# Patient Record
Sex: Male | Born: 1965 | Race: White | Hispanic: No | Marital: Married | Smoking: Never smoker
Health system: Southern US, Community
[De-identification: ages and names within clinical notes are randomized; demographics above are authoritative.]

## PROBLEM LIST (undated history)

## (undated) DIAGNOSIS — M51369 Other intervertebral disc degeneration, lumbar region without mention of lumbar back pain or lower extremity pain: Secondary | ICD-10-CM

## (undated) DIAGNOSIS — R768 Other specified abnormal immunological findings in serum: Secondary | ICD-10-CM

## (undated) DIAGNOSIS — K219 Gastro-esophageal reflux disease without esophagitis: Secondary | ICD-10-CM

## (undated) DIAGNOSIS — E785 Hyperlipidemia, unspecified: Secondary | ICD-10-CM

## (undated) DIAGNOSIS — M5136 Other intervertebral disc degeneration, lumbar region: Secondary | ICD-10-CM

## (undated) HISTORY — DX: Other intervertebral disc degeneration, lumbar region without mention of lumbar back pain or lower extremity pain: M51.369

## (undated) HISTORY — PX: NASAL SINUS SURGERY: SHX719

## (undated) HISTORY — DX: Other intervertebral disc degeneration, lumbar region: M51.36

## (undated) HISTORY — DX: Gastro-esophageal reflux disease without esophagitis: K21.9

## (undated) HISTORY — DX: Other specified abnormal immunological findings in serum: R76.8

## (undated) HISTORY — DX: Hyperlipidemia, unspecified: E78.5

---
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
## 2005-02-21 DIAGNOSIS — R768 Other specified abnormal immunological findings in serum: Secondary | ICD-10-CM

## 2005-02-21 HISTORY — DX: Other specified abnormal immunological findings in serum: R76.8

## 2005-08-24 HISTORY — PX: ESOPHAGOGASTRODUODENOSCOPY: SHX1529

## 2008-01-27 ENCOUNTER — Ambulatory Visit: Payer: Self-pay | Admitting: Vascular Surgery

## 2008-04-16 ENCOUNTER — Ambulatory Visit: Payer: Self-pay | Admitting: Cardiology

## 2008-05-10 ENCOUNTER — Ambulatory Visit: Payer: Self-pay | Admitting: Cardiology

## 2008-05-23 ENCOUNTER — Ambulatory Visit: Payer: Self-pay

## 2008-05-23 ENCOUNTER — Encounter: Payer: Self-pay | Admitting: Cardiology

## 2009-10-10 ENCOUNTER — Encounter: Payer: Self-pay | Admitting: Cardiology

## 2009-10-25 DIAGNOSIS — E785 Hyperlipidemia, unspecified: Secondary | ICD-10-CM | POA: Insufficient documentation

## 2009-10-29 ENCOUNTER — Ambulatory Visit: Payer: Self-pay | Admitting: Cardiology

## 2009-10-29 DIAGNOSIS — R072 Precordial pain: Secondary | ICD-10-CM | POA: Insufficient documentation

## 2009-10-29 DIAGNOSIS — R0602 Shortness of breath: Secondary | ICD-10-CM | POA: Insufficient documentation

## 2010-09-23 NOTE — Assessment & Plan Note (Signed)
Summary: f1y   Visit Type:  Follow-up Primary Provider:  Dr. Vernon Prey  CC:  Dyspnea.  History of Present Illness: The patient presents for followup of dyspnea.  I saw him a couple of years ago for evaluation of acute dyspnea. He has a family history of coronary disease. At that time he had an exercise treadmill test which demonstrated no evidence of high-grade obstructive coronary disease. He presents now because he had dyspnea recently while walking showing some property. He works in Research officer, political party. This was about 15 minutes of walking up and down some inclines. However, he became more dyspneic than he expected for that distance. He reports that he has not been exercising for about one year. He has not been having any chest pressure, neck or arm discomfort. He has not been having any palpitations, presyncope or syncope. He denies any PND or orthopnea.  Current Medications (verified): 1)  Multivitamins   Tabs (Multiple Vitamin) .Marland Kitchen.. 1 By Mouth Daily  Allergies (verified): No Known Drug Allergies  Past History:  Past Medical History:  Dyslipidemia   Past Surgical History: Reviewed history from 10/25/2009 and no changes required.  Sinus surgery  Review of Systems       As stated in the HPI and negative for all other systems.   Vital Signs:  Patient profile:   45 year old male Height:      68 inches Weight:      163 pounds BMI:     24.87 Pulse rate:   70 / minute Resp:     16 per minute BP sitting:   124 / 86  (right arm)  Vitals Entered By: Marrion Coy, CNA (October 29, 2009 11:51 AM)  Physical Exam  General:  Well developed, well nourished, in no acute distress. Head:  normocephalic and atraumatic Eyes:  PERRLA/EOM intact; conjunctiva and lids normal. Mouth:  Teeth, gums and palate normal. Oral mucosa normal. Neck:  Neck supple, no JVD. No masses, thyromegaly or abnormal cervical nodes. Chest Wall:  no deformities or breast masses noted Lungs:  Clear bilaterally to  auscultation and percussion. Abdomen:  Bowel sounds positive; abdomen soft and non-tender without masses, organomegaly, or hernias noted. No hepatosplenomegaly. Msk:  Back normal, normal gait. Muscle strength and tone normal. Extremities:  No clubbing or cyanosis. Neurologic:  Alert and oriented x 3. Skin:  Intact without lesions or rashes. Cervical Nodes:  no significant adenopathy Axillary Nodes:  no significant adenopathy Inguinal Nodes:  no significant adenopathy Psych:  Normal affect.   Detailed Cardiovascular Exam  Neck    Carotids: Carotids full and equal bilaterally without bruits.      Neck Veins: Normal, no JVD.    Heart    Inspection: no deformities or lifts noted.      Palpation: normal PMI with no thrills palpable.      Auscultation: regular rate and rhythm, S1, S2 without murmurs, rubs, gallops, or clicks.    Vascular    Abdominal Aorta: no palpable masses, pulsations, or audible bruits.      Femoral Pulses: normal femoral pulses bilaterally.      Pedal Pulses: normal pedal pulses bilaterally.      Radial Pulses: normal radial pulses bilaterally.      Peripheral Circulation: no clubbing, cyanosis, or edema noted with normal capillary refill.     EKG  Procedure date:  10/29/2009  Findings:      sinus rhythm, rate 70, axis rightward, intervals within normal limits, no acute ST-T wave  changes.  Impression & Recommendations:  Problem # 1:  DYSPNEA (ICD-786.05) The patient presents with this complaint. However, his physical examination is normal. He has no significant risk factors other than family history. He had a negative stress test 2 years ago. At this point I think the pretest probability of obstructive coronary disease as an etiology of this complaint is low. I suggested to him an exercise regimen. I told him that if he does not improve with this we should consider stress testing but that if dyspnea remains the predominant complaint I would suggest  cardiopulmonary stress testing rather than a plain old exercise treadmill test. He will begin an exercise regimen and let me know if he worsens or has no marked improvement. Orders: EKG w/ Interpretation (93000)  Problem # 2:  DYSLIPIDEMIA (ICD-272.4) I reviewed his lipids done by his primary physician. His HDL was 43 with an LDL of 46. Particle size was not in the optimum range. We did discuss the possibility of treatment with Niaspan as suggested by his primary physician. We discussed current guideline recommendations. He will go back and have further discussion with his physician concerning any further management.  Patient Instructions: 1)  Your physician recommends that you schedule a follow-up appointment as needed 2)  Your physician recommends that you continue on your current medications as directed. Please refer to the Current Medication list given to you today.

## 2011-01-06 NOTE — Procedures (Signed)
[facility]                              EXERCISE TREADMILL   NAME:[name], [name]                      MRN:          [id number]  DATE:05/10/2008                            DOB:          Oct 08, 1965    PROCEDURE:  Exercise treadmill test.   INDICATIONS:  Evaluate patient with multiple cardiovascular risk factors  and some vague chest discomfort.   PROCEDURE NOTE:  The patient was exercised using accelerated Bruce  protocol.  He was able to exercise into stage IV.  He exercised for 12  minutes and 30 seconds.  He achieved 17.3 METs.  His peak heart rate was  187, which was 105% of predicted.  He had appropriate blood pressure  response with a maximum of 156/76.  The test was terminated because he  achieved his target heart rate and beyond.  He had no chest pain or  other anginal equivalent.  No shortness of breath.  He did have some  ventricular tachycardia nonsustained at peak exercise.  He had multiple  couplets in one 5 beat run.  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  He did not notice this.  He had no ectopy in recovery.  He  had a normal heart rate recovery.   CONCLUSION:  Negative adequate exercise treadmill test with no evidence  of high-grade obstructive coronary artery disease.  He had an excellent  exercise tolerance.  He did have the ectopy as described, though it was  peak exertion not a recovery, which would be more worrisome for  obstruction.   PLAN:  Based on the above, I am going to make sure he has a structurally  normal heart given the ectopy.  If so no further cardiovascular testing  is suggested.  He will get an echocardiogram.     Rollene Rotunda, MD, [facility]  Electronically Signed    JH/MedQ  DD: 05/10/2008  DT: 05/11/2008  Job #: [id number]   cc:   Ernestina Penna, M.D.

## 2011-01-06 NOTE — Assessment & Plan Note (Signed)
[name]                            CARDIOLOGY OFFICE NOTE   NAME:[name], [name]                      MRN:          [id number]  DATE:04/16/2008                            DOB:          1965/10/01    PRIMARY CARE PHYSICIAN:  Ernestina Penna, MD   REASON FOR CONSULTATION:  Evaluate the patient with multiple  cardiovascular risk factors.   HISTORY OF PRESENT ILLNESS:  The patient is a pleasant 45 year old  gentleman with no prior cardiac history.  He has a very strong family  history of early-onset heart disease in his brothers.  His father has  advanced heart disease at a later age.  He has not had any  cardiovascular testing in the past.  He does have some dyslipidemia.  He  tries to watch what he eats.  He has had no recent symptoms.  He tries  to exercise 3 days a week about 2 miles at a time.  With this, he does  not get any chest pressure, neck, or arm discomfort.  He does not have  any palpitation, presyncope, or syncope.  He has had no PND or  orthopnea.  He did have an episode of acute dyspnea a few months ago.  He thought it might have been a panic attack.   PAST MEDICAL HISTORY:  Dyslipidemia (his LDL is 85, but his HDL is only  32).  The patient has no history of diabetes or hypertension.   PAST SURGICAL HISTORY:  Sinus surgery.   ALLERGIES:  None.   MEDICATIONS:  None.   SOCIAL HISTORY:  The patient is a Scientist, research (physical sciences).  He is married.  He has 2 children.  He has never smoked cigarettes.  He does not drink  alcohol.   FAMILY HISTORY:  Is contributory for early coronary artery disease.  He  had a brother with myocardial infarction at age 80.  He has another  brother with bypass at age 22.   REVIEW OF SYSTEMS:  As stated in the HPI and negative for all other  systems.   PHYSICAL EXAMINATION:  GENERAL:  The patient is in no distress.  VITAL SIGNS:  Blood pressure 120/79, heart rate 79 and regular, weight  160 pounds, and  body mass index 25.  HEENT:  Otherwise unremarkable; pupils equal, round, and reactive to  light; fundi nonvisualized;oral mucosa unremarkable.  NECK:  No jugular venous distention at 45 degrees; carotid upstroke  brisk and symmetric; no bruits, and no thyromegaly.  LYMPHATICS:  No cervical, axillary, or inguinal adenopathy.  LUNGS:  Clear to auscultation bilaterally.  BACK:  No costovertebral angle tenderness.  CHEST:  Unremarkable.  HEART:  PMI nondisplaced or sustained; S1 and S2 within normal limits;  no S3, no S4, and no murmurs.  ABDOMEN:  Flat; positive bowel sounds, normal in frequency and pitch; no  bruits, no rebound, no guarding, no midline pulsatile mass; no  hepatomegaly, no splenomegaly.  SKIN:  No rashes, no nodules.  EXTREMITIES:  2+ pulses throughout; no edema, no cyanosis, no clubbing.  NEUROLOGIC:  Oriented to person, place, and  time; cranial nerves II-XII  grossly intact; motor grossly intact.   EKG sinus rhythm, rate 66, axis within normal limits, intervals within  normal limits, no acute ST-T wave changes.   ASSESSMENT AND PLAN:  1. Dyspnea.  The patient had an episode of dyspnea few weeks back that      he thought may have been panic.  He does have multiple      cardiovascular risk factors.  In particularly, he has a very strong      family history.  The pretest probability of obstructive coronary      artery disease as an etiology is somewhat low.  However, exercise      treadmill testing is indicated.  This would allow me to adequately      exclude obstructive coronary artery disease.  In addition, it will      allow me to risk stratify.  Most importantly, it would allow me to      give him a prescription for exercise.  He is not exercising as much      as this would be suggested for primary risk reduction, and he is      interested in preventive medicine.  2. Dyslipidemia.  His HDL is slightly low.  His LDL is at target.  The      exercise regimen above  should increase his HDL by 10%.  He does not      need prescription therapy at this point.  3. Followup will be at the time of this POET (plain old exercise      treadmill).     Rollene Rotunda, MD, [facility]  Electronically Signed    JH/MedQ  DD: 04/16/2008  DT: 04/17/2008  Job #: [id number]   cc:   Ernestina Penna, M.D.

## 2011-01-06 NOTE — Consult Note (Signed)
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  He is an otherwise healthy 45-  year-old gentleman with a progressive pain in his right leg.  He had  noticed some small varicosities but over the past several weeks had had  pain associated with a prominent varix in his medial proximal calf.  This extends down into his mid calf.  He has also noted some new onset  of tingling in the dorsum of his foot.  He does not have any history of  deep venous thrombosis.  He is otherwise healthy with no major medical  difficulties.  He does not smoke cigarettes.  He is on no current  medications.  He has no known drug allergies.   PHYSICAL EXAM:  Well-developed well-nourished white male appearing  stated age of 54.  Blood pressure 130/79, pulse 71, respirations 18.  His dorsalis pedis pulses are 2+ bilaterally.  Does not have any  varicosities in the right leg.  He does have scattered telangiectasia  around his right ankle.  On the left leg, he does have varicose vein in  his medial calf.  He underwent screening handheld duplex by me and this  shows gross reflux and an enlarged right great saphenous vein to his  left thigh.  I discussed the significance of this with the patient.  Explained that he does not have any findings that would put him at any  significant risk regarding his left leg.  I did explain this, in all  likelihood will be progressive over time.  I explained initial treatment  would be graduated compression garments, should he develop worsening  symptoms and if he had failure of this he would be an excellent  candidate for laser ablation for correction of venous hypertension.  He  was reassured with this discussion and will see [place] again on an as-needed  basis.   Larina Earthly,  M.[name].  Electronically Signed   TFE/MEDQ  [name]:  01/27/2008  T:  01/30/2008  Job:  [id number]   cc:   Ernestina Penna, M.[name].

## 2011-02-16 ENCOUNTER — Encounter: Payer: Self-pay | Admitting: Internal Medicine

## 2011-04-01 ENCOUNTER — Encounter: Payer: Self-pay | Admitting: Internal Medicine

## 2011-04-01 ENCOUNTER — Ambulatory Visit (INDEPENDENT_AMBULATORY_CARE_PROVIDER_SITE_OTHER): Payer: BC Managed Care – PPO | Admitting: Internal Medicine

## 2011-04-01 VITALS — BP 118/84 | HR 80 | Ht 68.0 in | Wt 160.0 lb

## 2011-04-01 DIAGNOSIS — R1013 Epigastric pain: Secondary | ICD-10-CM

## 2011-04-01 DIAGNOSIS — R1011 Right upper quadrant pain: Secondary | ICD-10-CM

## 2011-04-01 NOTE — Patient Instructions (Signed)
You have been scheduled for an Abdominal Ultrasound at [facility] on Monday August 13 th at 9:00 am in the Radiology Department on the 1st floor.  Please have nothing to eat or drink after midnight the night before your procedure.

## 2011-04-01 NOTE — Progress Notes (Signed)
  Subjective:    Patient ID: Peter [name], male    DOB: 17-Mar-1966, 45 y.o.   MRN: [id number]  HPI RUQ pain since 05/28/10. Awakened at night. Saw [facility], told he had pulled a muscle. It has not disappeared. Worse with bending, twisting, movement. No problems with eating associated - uses a bland diet to avoid reflux. Has some transient chest pain a few weeks ago but did not last - he had eaten a hot dog before. The pain had improved but has worsened. Had been walking 3 miles a day but swinging arms will bother the side.  Using ibuprofen a few times a week for back pain when driving.  Uses occasional Zantac for heartburn but can go weeks without.  Does not recall an injury.      Review of Systems + back pain All other ROS negative.    Objective:   Physical Exam  Constitutional: He appears well-developed and well-nourished.  HENT:  Head: Normocephalic and atraumatic.  Eyes: No scleral icterus.  Neck: Normal range of motion. Neck supple. No JVD present. No thyromegaly present.  Cardiovascular: Normal rate, regular rhythm and normal heart sounds.  Exam reveals no gallop and no friction rub.   No murmur heard. Pulmonary/Chest: Effort normal and breath sounds normal.  Abdominal: Soft. Bowel sounds are normal. He exhibits no distension and no mass. There is no tenderness. There is no guarding.       No HSM No pain with mm tension No herniae in abdominal wall  Musculoskeletal: He exhibits no edema.  Lymphadenopathy:    He has no cervical adenopathy.  Skin: Skin is warm and dry.       Prominent superficial  veins in both lower costal areas  Psychiatric: He has a normal mood and affect.          Assessment & Plan:

## 2011-04-02 DIAGNOSIS — R1011 Right upper quadrant pain: Secondary | ICD-10-CM | POA: Insufficient documentation

## 2011-04-02 DIAGNOSIS — R1013 Epigastric pain: Secondary | ICD-10-CM | POA: Insufficient documentation

## 2011-04-02 NOTE — Assessment & Plan Note (Signed)
Has had for about 8 months. Seems musculoskeletal overall but at least one post-prandial episode of pain that was more epigastric. That raises ? Of cholelithiasis so will check US abdomen.

## 2011-04-02 NOTE — Assessment & Plan Note (Addendum)
Post-prandial ? Cholelithiasis, ? Part of RUQ pain cause (that sounds more musculoskeletal) Uses some ibuprofen but pain was isolated episode

## 2011-04-06 ENCOUNTER — Ambulatory Visit (HOSPITAL_COMMUNITY)
Admission: RE | Admit: 2011-04-06 | Discharge: 2011-04-06 | Disposition: A | Discharge status: Home / Self Care | Payer: BC Managed Care – PPO | Source: Ambulatory Visit | Attending: Internal Medicine | Admitting: Internal Medicine

## 2011-04-06 DIAGNOSIS — R109 Unspecified abdominal pain: Secondary | ICD-10-CM | POA: Insufficient documentation

## 2011-04-06 DIAGNOSIS — R1011 Right upper quadrant pain: Secondary | ICD-10-CM

## 2011-04-07 ENCOUNTER — Telehealth: Payer: Self-pay

## 2011-04-07 NOTE — Telephone Encounter (Signed)
Message copied by Annett Fabian on Tue Apr 07, 2011 11:27 AM ------      Message from: Iva Boop      Created: Tue Apr 07, 2011  5:40 AM      Regarding: [place] ok       US abdomen was normal      Please let him know            This reinforces that pain in RUQ is musculoskeletal            I advise he take Ibuprofen 600 mg tid x 2 weeks then go back to prn.            If still a problem I can have him see another MD that could possibly inject the muscles

## 2011-04-07 NOTE — Telephone Encounter (Signed)
Patient advised of Dr Marvell Fuller recommendations.  He will call back for further pain after trying ibuprofen 600 TID for 2 weeks.

## 2011-07-23 ENCOUNTER — Emergency Department (HOSPITAL_COMMUNITY)
Admission: EM | Admit: 2011-07-23 | Discharge: 2011-07-23 | Disposition: A | Discharge status: Home / Self Care | Payer: BC Managed Care – PPO | Attending: Emergency Medicine | Admitting: Emergency Medicine

## 2011-07-23 ENCOUNTER — Encounter (HOSPITAL_COMMUNITY): Payer: Self-pay

## 2011-07-23 DIAGNOSIS — R509 Fever, unspecified: Secondary | ICD-10-CM | POA: Insufficient documentation

## 2011-07-23 DIAGNOSIS — R197 Diarrhea, unspecified: Secondary | ICD-10-CM | POA: Insufficient documentation

## 2011-07-23 DIAGNOSIS — R112 Nausea with vomiting, unspecified: Secondary | ICD-10-CM

## 2011-07-23 DIAGNOSIS — K219 Gastro-esophageal reflux disease without esophagitis: Secondary | ICD-10-CM | POA: Insufficient documentation

## 2011-07-23 DIAGNOSIS — E785 Hyperlipidemia, unspecified: Secondary | ICD-10-CM | POA: Insufficient documentation

## 2011-07-23 LAB — URINALYSIS, ROUTINE W REFLEX MICROSCOPIC
Bilirubin Urine: NEGATIVE
Hgb urine dipstick: NEGATIVE
Ketones, ur: 40 mg/dL — AB
Protein, ur: NEGATIVE mg/dL
Specific Gravity, Urine: 1.021 (ref 1.005–1.030)
Urobilinogen, UA: 1 mg/dL (ref 0.0–1.0)

## 2011-07-23 LAB — POCT I-STAT, CHEM 8
Calcium, Ion: 1.05 mmol/L — ABNORMAL LOW (ref 1.12–1.32)
Creatinine, Ser: 1 mg/dL (ref 0.50–1.35)
Glucose, Bld: 134 mg/dL — ABNORMAL HIGH (ref 70–99)
Hemoglobin: 17.7 g/dL — ABNORMAL HIGH (ref 13.0–17.0)
TCO2: 23 mmol/L (ref 0–100)

## 2011-07-23 MED ORDER — ONDANSETRON HCL 4 MG/2ML IJ SOLN
4.0000 mg | Freq: Once | INTRAMUSCULAR | Status: AC
  Administered 2011-07-23: 4 mg via INTRAVENOUS
  Filled 2011-07-23: qty 2

## 2011-07-23 MED ORDER — ONDANSETRON HCL 4 MG PO TABS: 8.0000 mg | ORAL_TABLET | Freq: Three times a day (TID) | ORAL | Status: AC | PRN

## 2011-07-23 MED ORDER — SODIUM CHLORIDE 0.9 % IV BOLUS (SEPSIS)
1000.0000 mL | Freq: Once | INTRAVENOUS | Status: AC
  Administered 2011-07-23: 1000 mL via INTRAVENOUS

## 2011-07-23 MED ORDER — KETOROLAC TROMETHAMINE 30 MG/ML IJ SOLN
30.0000 mg | Freq: Once | INTRAMUSCULAR | Status: AC
  Administered 2011-07-23: 30 mg via INTRAVENOUS
  Filled 2011-07-23: qty 1

## 2011-07-23 MED ORDER — ACETAMINOPHEN 325 MG PO TABS
ORAL_TABLET | ORAL | Status: AC
  Filled 2011-07-23: qty 3

## 2011-07-23 MED ORDER — ACETAMINOPHEN 500 MG PO TABS
1000.0000 mg | ORAL_TABLET | Freq: Once | ORAL | Status: AC
  Administered 2011-07-23: 975 mg via ORAL
  Filled 2011-07-23: qty 2

## 2011-07-23 NOTE — ED Notes (Signed)
provided patient with urinal and will provide a urine specimen when he is able to void.

## 2011-07-23 NOTE — ED Notes (Signed)
Notified Dr. Patria Mane about pt's downward BP trend since arrival. Pt without any complaints. Denies CP, SOB, palpitations, abd pain or headaches. Nausea/vomitting has resolved. No changes in neuro status. Pt AAOx3, resp e/u and in NAD. Will continue to monitor.

## 2011-07-23 NOTE — ED Provider Notes (Signed)
History     CSN: [id number] Arrival date & time: 07/23/2011  5:16 AM   First MD Initiated Contact with Patient 07/23/11 [phone]      Chief Complaint  Patient presents with  . Nausea    + vomiting     (Consider location/radiation/quality/duration/timing/severity/associated sxs/prior treatment) The history is provided by the patient and the spouse.   patient reports acute onset nausea vomiting and diarrhea this evening at approximately 9 PM.  His stool has been without blood and has been watery.  He has had nonbloody nonbilious vomiting.  Developed fever today.  He denies abdominal pain.  Denies chest pain or shortness of breath.  He denies rash.  There are no sick contacts at this time.  He did receive his influenza vaccine earlier today.  He does have a history of GERD and peptic ulcer disease.  He reports no abdominal pain at this time.  The symptoms are moderate in severity.  Nothing worsens or improves his symptoms    Past Medical History  Diagnosis Date  . Dyslipidemia   . 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 GERD (gastroesophageal reflux disease)   . Degenerative disc disease, lumbar     Dr. Shelle Iron    Past Surgical History  Procedure Date  . Nasal sinus surgery   . Esophagogastroduodenoscopy 2007    Dr. Evette Cristal    Family History  Problem Relation Age of Onset  . Heart disease Brother 92  . Heart attack  47  . Colon polyps Mother     brother  . Prostate cancer Brother   . Diabetes Father     brothers    History  Substance Use Topics  . Smoking status: Never Smoker   . Smokeless tobacco: Never Used  . Alcohol Use: No      Review of Systems  All other systems reviewed and are negative.    Allergies  Review of patient's allergies indicates no known allergies.  Home Medications   Current Outpatient Rx  Name Route Sig Dispense Refill  . IBUPROFEN 200 MG PO TABS Oral Take 400 mg by mouth as needed. For pain       BP 113/74  Pulse 110  Temp(Src) 102 F (38.9 C) (Oral)  Resp 18  SpO2 97%  Physical Exam  Nursing note and vitals reviewed. Constitutional: He is oriented to person, place, and time. He appears well-developed and well-nourished.       Febrile. Uncomfortable appearing  HENT:  Head: Normocephalic and atraumatic.  Eyes: EOM are normal.  Neck: Normal range of motion.  Cardiovascular: Normal rate, normal heart sounds and intact distal pulses.        Tachycardiac   Pulmonary/Chest: Effort normal and breath sounds normal. No respiratory distress.  Abdominal: Soft. He exhibits no distension. There is no tenderness.  Musculoskeletal: Normal range of motion.  Neurological: He is alert and oriented to person, place, and time.  Skin: Skin is warm and dry.  Psychiatric: He has a normal mood and affect. Judgment normal.    ED Course  Procedures (including critical care time)  Labs Reviewed  POCT I-STAT, CHEM 8 - Abnormal; Notable for the following:    Potassium 3.4 (*)    Glucose, Bld 134 (*)    Calcium, Ion 1.05 (*)    Hemoglobin 17.7 (*)    All other components within normal limits  I-STAT, CHEM 8   No results found.   1. Nausea vomiting and  diarrhea       MDM  Suspect viral etiology.  Will check electrolytes and hemoglobin.  Zofran and fluids given.  Once is able to tolerate by mouth fluids we'll give him Tylenol for his fever.  He has no shortness of breath or hypoxia to suggest pneumonia.  His abdomen is completely benign on exam  7:12 AM Patient feels much better this time.  Pressure recorded a blood pressure of 84 systolic however the patient continues to feel well.  May repeat blood pressure was 102 systolic.  I then reevaluated the patient 5 minutes later and his repeat at that time was 110s systolic.  Is not orthostatic.  This does not appear to be sepsis.        Lyanne Co, MD 07/23/11 [phone]

## 2011-07-23 NOTE — ED Notes (Signed)
Pt drinking water. Tolerating well. Reports no n/v at this time. Will continue to monitor.

## 2011-08-25 HISTORY — PX: COLONOSCOPY: SHX174

## 2012-04-13 ENCOUNTER — Encounter: Payer: Self-pay | Admitting: Internal Medicine

## 2012-04-13 ENCOUNTER — Ambulatory Visit (INDEPENDENT_AMBULATORY_CARE_PROVIDER_SITE_OTHER): Payer: BC Managed Care – PPO | Admitting: Internal Medicine

## 2012-04-13 VITALS — BP 100/70 | HR 64 | Ht 68.0 in | Wt 160.2 lb

## 2012-04-13 DIAGNOSIS — K625 Hemorrhage of anus and rectum: Secondary | ICD-10-CM

## 2012-04-13 DIAGNOSIS — R1011 Right upper quadrant pain: Secondary | ICD-10-CM

## 2012-04-13 MED ORDER — NA SULFATE-K SULFATE-MG SULF 17.5-3.13-1.6 GM/177ML PO SOLN: 1.0000 | Freq: Once | ORAL | Status: DC

## 2012-04-13 NOTE — Patient Instructions (Addendum)
You have been scheduled for a colonoscopy with propofol. Please follow written instructions given to you at your visit today.  Please pick up your prep kit at the pharmacy within the next 1-3 days. If you use inhalers (even only as needed), please bring them with you on the day of your procedure.  Thank you for choosing me and [facility].  Carl E. Gessner, M.D., FACG  

## 2012-04-13 NOTE — Progress Notes (Signed)
  Subjective:    Patient ID: Peter [name], male    DOB: [birth year], 46 y.o.   MRN: [id number]  HPI The patient presents with complaints of recent rectal bleeding. He had 2 or 3 episodes of red blood with the stool and in the commode and with wiping. This was about 2 or 3 weeks ago. There was no associated abdominal pain or bowel habit change. He has never had this before.  He continues to have ongoing intermittent right upper quadrant pain, usually after fatty or greasy foods and it can last for hours. Within the past month he had 2 or 3 day period of right upper quadrant pain radiating around to the back after eating blueberries. There are not associated fevers. There are no associated urinary symptoms. Previous abdominal ultrasound did not reveal gallstones.  Medications, allergies, past medical history, past surgical history, family history and social history are reviewed and updated in the EMR.  Review of Systems As above.    Objective:   Physical Exam General:  NAD Eyes:   anicteric Lungs:  clear Heart:  S1S2 no rubs, murmurs or gallops Abdomen:  soft and nontender, BS+      Assessment & Plan:   1. Rectal bleeding   Evaluate with colonoscopy. The risks, benefits, and alternatives to endoscopy with possible biopsy and possible dilation were discussed with the patient and they consent to proceed.     2. RUQ pain   Chronic and recurrent - ? Check HIDA with EF after colonoscopy   CC: Rudi Heap, MD

## 2012-05-06 ENCOUNTER — Ambulatory Visit (AMBULATORY_SURGERY_CENTER): Payer: BC Managed Care – PPO | Admitting: Internal Medicine

## 2012-05-06 ENCOUNTER — Encounter: Payer: Self-pay | Admitting: Internal Medicine

## 2012-05-06 VITALS — BP 117/76 | HR 92 | Temp 98.1°F | Resp 12 | Ht 68.0 in | Wt 160.0 lb

## 2012-05-06 DIAGNOSIS — K648 Other hemorrhoids: Secondary | ICD-10-CM

## 2012-05-06 DIAGNOSIS — K625 Hemorrhage of anus and rectum: Secondary | ICD-10-CM

## 2012-05-06 DIAGNOSIS — R1011 Right upper quadrant pain: Secondary | ICD-10-CM

## 2012-05-06 MED ORDER — SODIUM CHLORIDE 0.9 % IV SOLN: 500.0000 mL | INTRAVENOUS | Status: DC

## 2012-05-06 NOTE — Progress Notes (Signed)
Patient did not experience any of the following events: a burn prior to discharge; a fall within the facility; wrong site/side/patient/procedure/implant event; or a hospital transfer or hospital admission upon discharge from the facility. (G8907) Patient did not have preoperative order for IV antibiotic SSI prophylaxis. (G8918)  

## 2012-05-06 NOTE — Op Note (Signed)
[facility] [address] [place], [address]   COLONOSCOPY PROCEDURE REPORT  PATIENT: Peter [name], Peter [name]  MR#: [id number] BIRTHDATE: 09-23-1965 , 46  yrs. old GENDER: Male ENDOSCOPIST: Iva Boop, MD, [facility] REFERRED HQ:[facility] Christell Constant, M.D. PROCEDURE DATE:  05/06/2012 PROCEDURE:   Colonoscopy, diagnostic ASA CLASS:   Class I INDICATIONS:rectal bleeding. MEDICATIONS: propofol (Diprivan) 200mg  IV, MAC sedation, administered by CRNA, and These medications were titrated to patient response per physician's verbal order  DESCRIPTION OF PROCEDURE:   After the risks benefits and alternatives of the procedure were thoroughly explained, informed consent was obtained.  A digital rectal exam revealed no abnormalities of the rectum and A digital rectal exam revealed the prostate was not enlarged.   The LB CF-[id number]  endoscope was introduced through the anus and advanced to the cecum, which was identified by both the appendix and ileocecal valve. No adverse events experienced.   The quality of the prep was Suprep excellent The instrument was then slowly withdrawn as the colon was fully examined.      COLON FINDINGS: Small internal hemorrhoids were found.   A normal appearing cecum, ileocecal valve, and appendiceal orifice were identified.  The ascending, hepatic flexure, transverse, splenic flexure, descending, sigmoid colon and rectum appeared unremarkable.  No polyps or cancers were seen.  Retroflexed views revealed internal hemorrhoids. The time to cecum=2 minutes 58 seconds.  Withdrawal time=6 minutes 09 seconds.  The scope was withdrawn and the procedure completed. COMPLICATIONS: There were no complications.  ENDOSCOPIC IMPRESSION: 1.   Small internal hemorrhoids 2.   Normal colon  RECOMMENDATIONS: repeat Colonscopy in 10 years.   eSigned:  Iva Boop, MD, [facility] 05/06/2012 2:37 PM   cc: Rudi Heap, MD and The Patient

## 2012-05-06 NOTE — Patient Instructions (Addendum)
The colonoscopy showed some internal hemorrhoids - it was otherwise ok.  If you would be interested in pursuing gallbladder surgery, we can schedule a HIDA scan of the gallbladder to evaluate the right abdominal pain.   Thank you for choosing me and [facility].  Iva Boop, MD, FACGFOLLOW DISCHARGE INSTRUCTIONS [facility] AND GREEN SHEETS). FOLLOW DISCHARGE INSTRUCTIONS (BLUE AND GREEN SHEETS). YOU HAD AN ENDOSCOPIC PROCEDURE TODAY AT THE [facility]: Refer to the procedure report that was given to you for any specific questions about what was found during the examination.  If the procedure report does not answer your questions, please call your gastroenterologist to clarify.  If you requested that your care partner not be given the details of your procedure findings, then the procedure report has been included in a sealed envelope for you to review at your convenience later.  YOU SHOULD EXPECT: Some feelings of bloating in the abdomen. Passage of more gas than usual.  Walking can help get rid of the air that was put into your GI tract during the procedure and reduce the bloating. If you had a lower endoscopy (such as a colonoscopy or flexible sigmoidoscopy) you may notice spotting of blood in your stool or on the toilet paper. If you underwent a bowel prep for your procedure, then you may not have a normal bowel movement for a few days.  DIET: Your first meal following the procedure should be a light meal and then it is ok to progress to your normal diet.  A half-sandwich or bowl of soup is an example of a good first meal.  Heavy or fried foods are harder to digest and may make you feel nauseous or bloated.  Likewise meals heavy in dairy and vegetables can cause extra gas to form and this can also increase the bloating.  Drink plenty of fluids but you should avoid alcoholic beverages for 24 hours.  ACTIVITY: Your care partner should take you home directly after the procedure.   You should plan to take it easy, moving slowly for the rest of the day.  You can resume normal activity the day after the procedure however you should NOT DRIVE or use heavy machinery for 24 hours (because of the sedation medicines used during the test).    SYMPTOMS TO REPORT IMMEDIATELY: A gastroenterologist can be reached at any hour.  During normal business hours, 8:30 AM to 5:00 PM Monday through Friday, call [phone].  After hours and on weekends, please call the GI answering service at [phone] who will take a message and have the physician on call contact you.   Following lower endoscopy (colonoscopy or flexible sigmoidoscopy):  Excessive amounts of blood in the stool  Significant tenderness or worsening of abdominal pains  Swelling of the abdomen that is new, acute  Fever of 100F or higher  Following upper endoscopy (EGD)  Vomiting of blood or coffee ground material  New chest pain or pain under the shoulder blades  Painful or persistently difficult swallowing  New shortness of breath  Fever of 100F or higher  Black, tarry-looking stools  FOLLOW UP: If any biopsies were taken you will be contacted by phone or by letter within the next 1-3 weeks.  Call your gastroenterologist if you have not heard about the biopsies in 3 weeks.  Our staff will call the home number listed on your records the next business day following your procedure to check on you and address any questions or concerns  that you may have at that time regarding the information given to you following your procedure. This is a courtesy call and so if there is no answer at the home number and we have not heard from you through the emergency physician on call, we will assume that you have returned to your regular daily activities without incident.  SIGNATURES/CONFIDENTIALITY: You and/or your care partner have signed paperwork which will be entered into your electronic medical record.  These signatures attest  to the fact that that the information above on your After Visit Summary has been reviewed and is understood.  Full responsibility of the confidentiality of this discharge information lies with you and/or your care-partner.   Hemorrhoid information sheet given.

## 2012-05-09 ENCOUNTER — Telehealth: Payer: Self-pay | Admitting: *Deleted

## 2012-05-09 NOTE — Telephone Encounter (Signed)
  Follow up Call-  Call back number 05/06/2012  Post procedure Call Back phone  # [phone]  Permission to leave phone message Yes     Patient questions:  Do you have a fever, pain , or abdominal swelling? no Pain Score  0 *  Have you tolerated food without any problems? yes  Have you been able to return to your normal activities? yes  Do you have any questions about your discharge instructions: Diet   no Medications  no Follow up visit  no  Do you have questions or concerns about your Care? no  Actions: * If pain score is 4 or above: No action needed, pain <4.

## 2012-11-24 ENCOUNTER — Other Ambulatory Visit: Payer: Self-pay | Admitting: Nurse Practitioner

## 2012-11-24 ENCOUNTER — Telehealth: Payer: Self-pay | Admitting: Nurse Practitioner

## 2012-11-24 MED ORDER — ALBUTEROL SULFATE HFA 108 (90 BASE) MCG/ACT IN AERS: 2.0000 | INHALATION_SPRAY | Freq: Four times a day (QID) | RESPIRATORY_TRACT | Status: DC | PRN

## 2012-11-24 NOTE — Telephone Encounter (Signed)
Chart in office

## 2012-11-24 NOTE — Telephone Encounter (Signed)
Need chart

## 2012-11-24 NOTE — Telephone Encounter (Signed)
Please Advise

## 2013-08-16 ENCOUNTER — Telehealth: Payer: Self-pay | Admitting: Nurse Practitioner

## 2013-10-03 ENCOUNTER — Telehealth: Payer: Self-pay | Admitting: Family Medicine

## 2013-10-03 ENCOUNTER — Encounter (INDEPENDENT_AMBULATORY_CARE_PROVIDER_SITE_OTHER): Payer: Self-pay

## 2013-10-03 ENCOUNTER — Ambulatory Visit (INDEPENDENT_AMBULATORY_CARE_PROVIDER_SITE_OTHER): Payer: BC Managed Care – PPO | Admitting: Family Medicine

## 2013-10-03 VITALS — BP 108/69 | HR 113 | Temp 100.8°F | Ht 69.0 in | Wt 156.6 lb

## 2013-10-03 DIAGNOSIS — R509 Fever, unspecified: Secondary | ICD-10-CM

## 2013-10-03 DIAGNOSIS — R52 Pain, unspecified: Secondary | ICD-10-CM

## 2013-10-03 LAB — POCT INFLUENZA A/B
Influenza A, POC: NEGATIVE
Influenza B, POC: NEGATIVE

## 2013-10-03 MED ORDER — PROMETHAZINE HCL 25 MG PO TABS: ORAL_TABLET | ORAL | Status: DC

## 2013-10-03 NOTE — Progress Notes (Signed)
   Subjective:    Patient ID: Peter [name], male    DOB: 11/01/1965, 48 y.o.   MRN: [id number]  HPI  This 48 y.o. male presents for evaluation of nausea, arthralgias, myalgias, and fever.  Review of Systems    No chest pain, SOB, HA, dizziness, vision change, diarrhea, constipation, dysuria, urinary urgency or frequency, myalgias, arthralgias or rash.  Objective:   Physical Exam  Vital signs noted  Well developed well nourished male.  HEENT - Head atraumatic Normocephalic                Eyes - PERRLA, Conjuctiva - clear Sclera- Clear EOMI                Ears - EAC's Wnl TM's Wnl Gross Hearing WNL                Throat - oropharanx wnl Respiratory - Lungs CTA bilateral Cardiac - RRR S1 and S2 without murmur GI - Abdomen soft Nontender and bowel sounds active x 4 Extremities - No edema. Neuro - Grossly intact.      Assessment & Plan:  Body aches - Plan: POCT Influenza A/B  Fever - Plan: POCT Influenza A/B  Gastroenteritis - Promethazine 25mg  one po tid prn #30w/1  Push po fluids, rest, tylenol and motrin otc prn as directed for fever, arthralgias, and myalgias.  Follow up prn if sx's continue or persist.  Deatra CanterWilliam J Tae Vonada FNP

## 2013-10-03 NOTE — Telephone Encounter (Signed)
APPT MADE FOR TODAY

## 2013-10-03 NOTE — Patient Instructions (Signed)

## 2013-10-24 ENCOUNTER — Encounter: Payer: Self-pay | Admitting: Family Medicine

## 2013-10-24 ENCOUNTER — Ambulatory Visit (INDEPENDENT_AMBULATORY_CARE_PROVIDER_SITE_OTHER): Payer: BC Managed Care – PPO | Admitting: Family Medicine

## 2013-10-24 ENCOUNTER — Telehealth: Payer: Self-pay | Admitting: Nurse Practitioner

## 2013-10-24 VITALS — BP 119/75 | HR 86 | Temp 99.4°F | Ht 69.0 in | Wt 157.2 lb

## 2013-10-24 DIAGNOSIS — J329 Chronic sinusitis, unspecified: Secondary | ICD-10-CM

## 2013-10-24 MED ORDER — AMOXICILLIN 875 MG PO TABS: 875.0000 mg | ORAL_TABLET | Freq: Two times a day (BID) | ORAL | Status: DC

## 2013-10-24 NOTE — Telephone Encounter (Signed)
Appt given for today 

## 2013-10-24 NOTE — Progress Notes (Signed)
   Subjective:    Patient ID: Peter [name], male    DOB: 01/03/1966, 48 y.o.   MRN: [id number]  HPI This 48 y.o. male presents for evaluation of URI sx's for over a week.   Review of Systems    No chest pain, SOB, HA, dizziness, vision change, N/V, diarrhea, constipation, dysuria, urinary urgency or frequency, myalgias, arthralgias or rash.  Objective:   Physical Exam Vital signs noted  Well developed well nourished male.  HEENT - Head atraumatic Normocephalic                Eyes - PERRLA, Conjuctiva - clear Sclera- Clear EOMI                Ears - EAC's Wnl TM's Wnl Gross Hearing WNL                Nose - Nares patent                 Throat - oropharanx wnl Respiratory - Lungs CTA bilateral Cardiac - RRR S1 and S2 without murmur GI - Abdomen soft Nontender and bowel sounds active x 4 Extremities - No edema. Neuro - Grossly intact.       Assessment & Plan:  Sinusitis - Plan: amoxicillin (AMOXIL) 875 MG tablet Po bid x 2 weeks.  Push po fluids, rest, tylenol and motrin otc prn as directed for fever, arthralgias, and myalgias.  Follow up prn if sx's continue or persist.  Deatra CanterWilliam J Jondavid Schreier FNP

## 2014-11-01 ENCOUNTER — Encounter (HOSPITAL_COMMUNITY): Payer: Self-pay

## 2014-11-01 ENCOUNTER — Emergency Department (HOSPITAL_COMMUNITY)
Admission: EM | Admit: 2014-11-01 | Discharge: 2014-11-01 | Disposition: A | Discharge status: Home / Self Care | Payer: BLUE CROSS/BLUE SHIELD | Attending: Emergency Medicine | Admitting: Emergency Medicine

## 2014-11-01 DIAGNOSIS — Z8719 Personal history of other diseases of the digestive system: Secondary | ICD-10-CM | POA: Insufficient documentation

## 2014-11-01 DIAGNOSIS — Z8619 Personal history of other infectious and parasitic diseases: Secondary | ICD-10-CM | POA: Insufficient documentation

## 2014-11-01 DIAGNOSIS — M5441 Lumbago with sciatica, right side: Secondary | ICD-10-CM | POA: Insufficient documentation

## 2014-11-01 DIAGNOSIS — Z792 Long term (current) use of antibiotics: Secondary | ICD-10-CM | POA: Insufficient documentation

## 2014-11-01 DIAGNOSIS — Z8639 Personal history of other endocrine, nutritional and metabolic disease: Secondary | ICD-10-CM | POA: Diagnosis not present

## 2014-11-01 DIAGNOSIS — M545 Low back pain: Secondary | ICD-10-CM | POA: Diagnosis present

## 2014-11-01 MED ORDER — NAPROXEN 500 MG PO TABS: 500.0000 mg | ORAL_TABLET | Freq: Two times a day (BID) | ORAL | Status: DC

## 2014-11-01 MED ORDER — OXYCODONE-ACETAMINOPHEN 5-325 MG PO TABS: 1.0000 | ORAL_TABLET | ORAL | Status: DC | PRN

## 2014-11-01 MED ORDER — DIAZEPAM 5 MG PO TABS: 5.0000 mg | ORAL_TABLET | Freq: Two times a day (BID) | ORAL | Status: DC

## 2014-11-01 MED ORDER — KETOROLAC TROMETHAMINE 60 MG/2ML IM SOLN
60.0000 mg | Freq: Once | INTRAMUSCULAR | Status: AC
Start: 2014-11-01 — End: 2014-11-01
  Administered 2014-11-01: 60 mg via INTRAMUSCULAR
  Filled 2014-11-01: qty 2

## 2014-11-01 MED ORDER — DIAZEPAM 5 MG PO TABS
5.0000 mg | ORAL_TABLET | Freq: Once | ORAL | Status: AC
Start: 2014-11-01 — End: 2014-11-01
  Administered 2014-11-01: 5 mg via ORAL
  Filled 2014-11-01: qty 1

## 2014-11-01 NOTE — ED Provider Notes (Signed)
CSN: [id number]     Arrival date & time 11/01/14  1400 History   First MD Initiated Contact with Patient 11/01/14 1501     Chief Complaint  Patient presents with  . Back Pain     (Consider location/radiation/quality/duration/timing/severity/associated sxs/prior Treatment) HPI Peter [name] is a 49 y.o. male with a history of degenerative disc disease in his lumbar spine comes in for evaluation of back pain. Patient states for the past 2 days he has had a gradual onset in right lower back pain that he attributes to riding in the car "a lot for the past 2 weeks". Reports he typically can relieve his symptoms with ibuprofen and ice, but that therapy did not work this time. He reports associated sharp shooting pains and tingling down his right leg with certain movements. He reports today his back "locked up on me" and he has had increasing difficulties ambulating. Denies any numbness or weakness, loss of bowel or bladder, fevers, persistent history of cancer, IV drug use, chronic steroid use. Patient follows up with Dr. Shelle Iron, orthopedics and reports he will be able to schedule an appointment for further evaluation.  Past Medical History  Diagnosis Date  . Dyslipidemia   . 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 GERD (gastroesophageal reflux disease)   . Degenerative disc disease, lumbar     Dr. Shelle Iron   Past Surgical History  Procedure Laterality Date  . Nasal sinus surgery    . Esophagogastroduodenoscopy  2007    Dr. Evette Cristal   Family History  Problem Relation Age of Onset  . Heart disease Brother 70  . Heart attack  47  . Colon polyps Mother   . Prostate cancer Brother   . Diabetes Father   . Colon polyps Brother   . Diabetes Brother    History  Substance Use Topics  . Smoking status: Never Smoker   . Smokeless tobacco: Never Used  . Alcohol Use: No    Review of Systems A 10 point review of systems was completed and was negative except  for pertinent positives and negatives as mentioned in the history of present illness     Allergies  Review of patient's allergies indicates no known allergies.  Home Medications   Prior to Admission medications   Medication Sig Start Date End Date Taking? Authorizing Provider  ibuprofen (ADVIL) 200 MG tablet Take 600 mg by mouth 2 (two) times daily as needed for moderate pain (pain). For pain   Yes Historical Provider, MD  albuterol (PROVENTIL HFA;VENTOLIN HFA) 108 (90 BASE) MCG/ACT inhaler Inhale 2 puffs into the lungs every 6 (six) hours as needed for wheezing. 11/24/12   Mary-Margaret Daphine Deutscher, FNP  amoxicillin (AMOXIL) 875 MG tablet Take 1 tablet (875 mg total) by mouth 2 (two) times daily. Patient not taking: Reported on 11/01/2014 10/24/13   Deatra Canter, FNP  diazepam (VALIUM) 5 MG tablet Take 1 tablet (5 mg total) by mouth 2 (two) times daily. 11/01/14   Joycie Peek, PA-C  naproxen (NAPROSYN) 500 MG tablet Take 1 tablet (500 mg total) by mouth 2 (two) times daily. 11/01/14   Joycie Peek, PA-C  oxyCODONE-acetaminophen (PERCOCET) 5-325 MG per tablet Take 1 tablet by mouth every 4 (four) hours as needed. 11/01/14   Joycie Peek, PA-C  promethazine (PHENERGAN) 25 MG tablet One to two po q 6 hours prn nausea Patient not taking: Reported on 11/01/2014 10/03/13   Deatra Canter, FNP   BP  113/78 mmHg  Pulse 82  Temp(Src) 98.6 F (37 C) (Oral)  Resp 14  SpO2 100% Physical Exam  Constitutional: He is oriented to person, place, and time. He appears well-developed and well-nourished.  HENT:  Head: Normocephalic and atraumatic.  Mouth/Throat: Oropharynx is clear and moist.  Eyes: Conjunctivae are normal. Pupils are equal, round, and reactive to light. Right eye exhibits no discharge. Left eye exhibits no discharge. No scleral icterus.  Neck: Neck supple.  Cardiovascular: Normal rate, regular rhythm, normal heart sounds and intact distal pulses.  Exam reveals no gallop and no  friction rub.   No murmur heard. Pulmonary/Chest: Effort normal and breath sounds normal. No respiratory distress. He has no wheezes. He has no rales.  Abdominal: Soft. There is no tenderness.  Musculoskeletal: He exhibits no tenderness.  Maintains full active range of motion of all extremities. Tenderness to palpation over right SI joint and paraspinal lumbar muscles. No midline bony tenderness. No other lesions, erythema or other gross deformities noted.  Neurological: He is alert and oriented to person, place, and time.  Cranial Nerves II-XII grossly intact. Pt has 4/5 motor strength with plantar flexion of R ankle. L leg remains 5/5 motor. Sensation intact. R straight leg raise reproduces sharp shooting pain. No other focal neurodeficits  Skin: Skin is warm and dry. No rash noted.  Psychiatric: He has a normal mood and affect.  Nursing note and vitals reviewed.   ED Course  Procedures (including critical care time) Labs Review Labs Reviewed - No data to display  Imaging Review No results found.   EKG Interpretation None     Meds given in ED:  Medications  ketorolac (TORADOL) injection 60 mg (60 mg Intramuscular Given 11/01/14 1611)  diazepam (VALIUM) tablet 5 mg (5 mg Oral Given 11/01/14 1611)    New Prescriptions   DIAZEPAM (VALIUM) 5 MG TABLET    Take 1 tablet (5 mg total) by mouth 2 (two) times daily.   NAPROXEN (NAPROSYN) 500 MG TABLET    Take 1 tablet (500 mg total) by mouth 2 (two) times daily.   OXYCODONE-ACETAMINOPHEN (PERCOCET) 5-325 MG PER TABLET    Take 1 tablet by mouth every 4 (four) hours as needed.   Filed Vitals:   11/01/14 1404 11/01/14 1644  BP: 114/80 113/78  Pulse: 89 82  Temp: 98.1 F (36.7 C) 98.6 F (37 C)  TempSrc: Oral Oral  Resp: 16 14  SpO2: 100% 100%    MDM  Vitals stable - WNL -afebrile Pt resting comfortably in ED.  pain medicines administered in ED improved discomfort. No pathologic back pain red flags. He reports he feels much  better and is able to stand up straight. Gait is somewhat antalgic but without ataxia. PE--patient with slightly decreased motor strength with right foot plantarflexion, suspect due to discomfort. After pain medicines, repeat neuro exam shows 5/5 motor strength with right foot plantarflexion. No focal neuro deficits at this time.  DDX--patient with history of degenerative disc disease lumbar region Will DC with short course pain medicines and encourage follow-up with his orthopedist for further evaluation and management of his symptoms. I discussed all relevant lab findings and imaging results with pt and they verbalized understanding. Discussed f/u with PCP within 48 hrs and return precautions, pt very amenable to plan.  Prior to patient discharge, I discussed and reviewed this case with Dr.Lockwood     Final diagnoses:  Right-sided low back pain with right-sided sciatica  Joycie PeekBenjamin Zelphia Glover, PA-C 11/02/14 1018  Gerhard Munchobert Lockwood, MD 11/05/14 [phone]

## 2014-11-01 NOTE — ED Notes (Signed)
Per EMS- Patient has a history of back pain/bulging disc. Patient reports right lower back pain yesterday that is worse today. Patient also c/o numbness and tingling of the right leg. Patient reports that he took 600 mg Advil at 0900 with no relief. Patient states he is unable to walk, stand, or sit without feeling pain.

## 2014-11-01 NOTE — ED Notes (Signed)
[facility] Expected date:  Expected time:  Means of arrival:  Comments: EMS- back pain, cannot walk or sit

## 2014-11-01 NOTE — Discharge Instructions (Signed)
Lumbosacral Strain Lumbosacral strain is a strain of any of the parts that make up your lumbosacral vertebrae. Your lumbosacral vertebrae are the bones that make up the lower third of your backbone. Your lumbosacral vertebrae are held together by muscles and tough, fibrous tissue (ligaments).  CAUSES  A sudden blow to your back can cause lumbosacral strain. Also, anything that causes an excessive stretch of the muscles in the low back can cause this strain. This is typically seen when people exert themselves strenuously, fall, lift heavy objects, bend, or crouch repeatedly. RISK FACTORS  Physically demanding work.  Participation in pushing or pulling sports or sports that require a sudden twist of the back (tennis, golf, baseball).  Weight lifting.  Excessive lower back curvature.  Forward-tilted pelvis.  Weak back or abdominal muscles or both.  Tight hamstrings. SIGNS AND SYMPTOMS  Lumbosacral strain may cause pain in the area of your injury or pain that moves (radiates) down your leg.  DIAGNOSIS Your health care provider can often diagnose lumbosacral strain through a physical exam. In some cases, you may need tests such as X-ray exams.  TREATMENT  Treatment for your lower back injury depends on many factors that your clinician will have to evaluate. However, most treatment will include the use of anti-inflammatory medicines. HOME CARE INSTRUCTIONS   Avoid hard physical activities (tennis, racquetball, waterskiing) if you are not in proper physical condition for it. This may aggravate or create problems.  If you have a back problem, avoid sports requiring sudden body movements. Swimming and walking are generally safer activities.  Maintain good posture.  Maintain a healthy weight.  For acute conditions, you may put ice on the injured area.  Put ice in a plastic bag.  Place a towel between your skin and the bag.  Leave the ice on for 20 minutes, 2-3 times a day.  When the  low back starts healing, stretching and strengthening exercises may be recommended. SEEK MEDICAL CARE IF:  Your back pain is getting worse.  You experience severe back pain not relieved with medicines. SEEK IMMEDIATE MEDICAL CARE IF:   You have numbness, tingling, weakness, or problems with the use of your arms or legs.  There is a change in bowel or bladder control.  You have increasing pain in any area of the body, including your belly (abdomen).  You notice shortness of breath, dizziness, or feel faint.  You feel sick to your stomach (nauseous), are throwing up (vomiting), or become sweaty.  You notice discoloration of your toes or legs, or your feet get very cold. MAKE SURE YOU:   Understand these instructions.  Will watch your condition.  Will get help right away if you are not doing well or get worse. Document Released: 05/20/2005 Document Revised: 08/15/2013 Document Reviewed: 03/29/2013 [facility] Patient Information 2015 [employer]. This information is not intended to replace advice given to you by your health care provider. Make sure you discuss any questions you have with your health care provider.  Back Pain, Adult Back pain is very common. The pain often gets better over time. The cause of back pain is usually not dangerous. Most people can learn to manage their back pain on their own.  HOME CARE   Stay active. Start with short walks on flat ground if you can. Try to walk farther each day.  Do not sit, drive, or stand in one place for more than 30 minutes. Do not stay in bed.  Do not avoid exercise or  work. Activity can help your back heal faster.  Be careful when you bend or lift an object. Bend at your knees, keep the object close to you, and do not twist.  Sleep on a firm mattress. Lie on your side, and bend your knees. If you lie on your back, put a pillow under your knees.  Only take medicines as told by your doctor.  Put ice on the injured  area.  Put ice in a plastic bag.  Place a towel between your skin and the bag.  Leave the ice on for 15-20 minutes, 03-04 times a day for the first 2 to 3 days. After that, you can switch between ice and heat packs.  Ask your doctor about back exercises or massage.  Avoid feeling anxious or stressed. Find good ways to deal with stress, such as exercise. GET HELP RIGHT AWAY IF:   Your pain does not go away with rest or medicine.  Your pain does not go away in 1 week.  You have new problems.  You do not feel well.  The pain spreads into your legs.  You cannot control when you poop (bowel movement) or pee (urinate).  Your arms or legs feel weak or lose feeling (numbness).  You feel sick to your stomach (nauseous) or throw up (vomit).  You have belly (abdominal) pain.  You feel like you may pass out (faint). MAKE SURE YOU:   Understand these instructions.  Will watch your condition.  Will get help right away if you are not doing well or get worse. Document Released: 01/27/2008 Document Revised: 11/02/2011 Document Reviewed: 12/12/2013 [facility] Patient Information 2015 [place], [place]. This information is not intended to replace advice given to you by your health care provider. Make sure you discuss any questions you have with your health care provider.   Please take your medications as prescribed. Do not take your Valium or Percocet before driving or operating any machinery. Take your naproxen as directed to help with inflammation and mild discomfort. You may take your Percocet for moderate to severe pain. Please follow-up with your orthopedist and primary care for further evaluation and management of your symptoms. Return to ED for new or worsening symptoms

## 2015-12-10 DIAGNOSIS — H5213 Myopia, bilateral: Secondary | ICD-10-CM | POA: Diagnosis not present

## 2015-12-10 DIAGNOSIS — H0014 Chalazion left upper eyelid: Secondary | ICD-10-CM | POA: Diagnosis not present

## 2015-12-10 DIAGNOSIS — H0011 Chalazion right upper eyelid: Secondary | ICD-10-CM | POA: Diagnosis not present

## 2016-02-06 ENCOUNTER — Encounter: Payer: Self-pay | Admitting: Cardiology

## 2016-02-13 NOTE — Progress Notes (Signed)
Cardiology Office Note   Date:  02/14/2016   ID:  Peter [name], DOB 09/28/1965, MRN [id number]  PCP:  Mechele ClaudeSTACKS,WARREN, MD  Cardiologist:   Rollene RotundaJames Jeff Frieden, MD   Chief Complaint  Patient presents with  . Shortness of Breath      History of Present Illness: Peter GlassingJeffrey D [name] is a 50 y.o. male who presents for follow-up of significant cardiovascular risk factors.  He has a very strong family history of early coronary disease in his father and brothers. He has not had any vascular disease in the past. He had a distant treadmill test. However, he hasn't had a lot of medical follow-up. He doesn't get blood work drawn routinely. He's not exercising routinely though he's active and gets 9-11,000 steps daily on his job.  The patient denies any new symptoms such as chest discomfort, neck or arm discomfort. There has been no new shortness of breath, PND or orthopnea. There have been no reported palpitations, presyncope or syncope.  He is limited somewhat with back pain.  He does complain of cough when he starts laughing and also shortness of breath only when he is talking. This is been a chronic problem.   Past Medical History  Diagnosis Date  . Dyslipidemia   . 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 GERD (gastroesophageal reflux disease)   . Degenerative disc disease, lumbar     Dr. Shelle IronBeane    Past Surgical History  Procedure Laterality Date  . Nasal sinus surgery    . Esophagogastroduodenoscopy  2007    Dr. Evette CristalGanem     Current Outpatient Prescriptions  Medication Sig Dispense Refill  . albuterol (PROVENTIL HFA;VENTOLIN HFA) 108 (90 BASE) MCG/ACT inhaler Inhale 2 puffs into the lungs every 6 (six) hours as needed for wheezing. 1 Inhaler 1  . ibuprofen (ADVIL) 200 MG tablet Take 600 mg by mouth 2 (two) times daily as needed for moderate pain (pain). For pain    . omeprazole (PRILOSEC) 20 MG capsule Take 1 capsule (20 mg total) by mouth daily. 30 capsule  11   No current facility-administered medications for this visit.    Allergies:   Review of patient's allergies indicates no known allergies.    Social History:  The patient  reports that he has never smoked. He has never used smokeless tobacco. He reports that he does not drink alcohol or use illicit drugs.   Family History:  The patient's family history includes Colon polyps in his brother and mother; Diabetes in his brother and father; Heart attack (age of onset: 4947) in his brother; Heart disease (age of onset: 3659) in his brother; Heart disease (age of onset: 5865) in his father; Prostate cancer in his brother.    ROS:  Please see the history of present illness.   Otherwise, review of systems are positive for none.   All other systems are reviewed and negative.    PHYSICAL EXAM: VS:  BP 112/80 mmHg  Pulse 73  Ht 5\' 8"  (1.727 m)  Wt 159 lb (72.122 kg)  BMI 24.18 kg/m2 , BMI Body mass index is 24.18 kg/(m^2). GENERAL:  Well appearing HEENT:  Pupils equal round and reactive, fundi not visualized, oral mucosa unremarkable NECK:  No jugular venous distention, waveform within normal limits, carotid upstroke brisk and symmetric, no bruits, no thyromegaly LYMPHATICS:  No cervical, inguinal adenopathy LUNGS:  Clear to auscultation bilaterally BACK:  No CVA tenderness CHEST:  Unremarkable HEART:  PMI  not displaced or sustained,S1 and S2 within normal limits, no S3, no S4, no clicks, no rubs, no murmurs ABD:  Flat, positive bowel sounds normal in frequency in pitch, no bruits, no rebound, no guarding, no midline pulsatile mass, no hepatomegaly, no splenomegaly EXT:  2 plus pulses throughout, no edema, no cyanosis no clubbing SKIN:  No rashes no nodules NEURO:  Cranial nerves II through XII grossly intact, motor grossly intact throughout PSYCH:  Cognitively intact, oriented to person place and time    EKG:  EKG is ordered today. The ekg ordered today demonstrates sinus rhythm, rate 73,  axis within normal limits, intervals within normal limits, no acute ST-T wave changes.   Recent Labs: No results found for requested labs within last 365 days.    Lipid Panel No results found for: CHOL, TRIG, HDL, CHOLHDL, VLDL, LDLCALC, LDLDIRECT    Wt Readings from Last 3 Encounters:  02/14/16 159 lb (72.122 kg)  10/24/13 157 lb 3.2 oz (71.305 kg)  10/03/13 156 lb 9.6 oz (71.033 kg)      Other studies Reviewed: Additional studies/ records that were reviewed today include: None. Review of the above records demonstrates:  Please see elsewhere in the note.     ASSESSMENT AND PLAN:  FAMILY HISTORY:  The patient has a strong family history of first-degree relatives having coronary disease. He needs aggressive primary risk reduction. Going to check a coronary calcium score. He also needs some other routine labs to include lipid profile, CMET, A1C, PSA  COUGH:  I wonder if this could be related to reflux. I'm going to try him on a trial of Prilosec 20 mg daily.   Current medicines are reviewed at length with the patient today.  The patient does not have concerns regarding medicines.  The following changes have been made:  As above  Labs/ tests ordered today include:   Orders Placed This Encounter  Procedures  . CT CARDIAC SCORING  . CBC  . HgB A1c  . PSA  . Lipid Profile  . Comprehensive Metabolic Panel (CMET)     Disposition:   FU with me in a couple of years.      Signed, Rollene RotundaJames Danyle Boening, MD  02/14/2016 12:29 PM    [facility]

## 2016-02-14 ENCOUNTER — Encounter: Payer: Self-pay | Admitting: Cardiology

## 2016-02-14 ENCOUNTER — Ambulatory Visit (INDEPENDENT_AMBULATORY_CARE_PROVIDER_SITE_OTHER): Payer: BLUE CROSS/BLUE SHIELD | Admitting: Cardiology

## 2016-02-14 VITALS — BP 112/80 | HR 73 | Ht 68.0 in | Wt 159.0 lb

## 2016-02-14 DIAGNOSIS — E785 Hyperlipidemia, unspecified: Secondary | ICD-10-CM | POA: Diagnosis not present

## 2016-02-14 DIAGNOSIS — Z79899 Other long term (current) drug therapy: Secondary | ICD-10-CM | POA: Diagnosis not present

## 2016-02-14 DIAGNOSIS — Z125 Encounter for screening for malignant neoplasm of prostate: Secondary | ICD-10-CM

## 2016-02-14 DIAGNOSIS — Z131 Encounter for screening for diabetes mellitus: Secondary | ICD-10-CM

## 2016-02-14 DIAGNOSIS — R0602 Shortness of breath: Secondary | ICD-10-CM

## 2016-02-14 MED ORDER — OMEPRAZOLE 20 MG PO CPDR: 20.0000 mg | DELAYED_RELEASE_CAPSULE | Freq: Every day | ORAL | Status: DC

## 2016-02-14 NOTE — Patient Instructions (Addendum)
Medication Instructions:  START Prilosec(Omeprazole) 20 mg daily  Labwork: Fasting Lipids, CMP CBC, PSA, Hgb A1C   Testing/Procedures: Your physician requested that you have a Coronary calcium scoring Test, this test is done at our [address].  Follow-Up: Your physician wants you to follow-up in: 2 Year. You will receive a reminder letter in the mail two months in advance. If you don't receive a letter, please call our office to schedule the follow-up appointment.   Any Other Special Instructions Will Be Listed Below (If Applicable).   If you need a refill on your cardiac medications before your next appointment, please call your pharmacy.

## 2016-02-21 ENCOUNTER — Ambulatory Visit (INDEPENDENT_AMBULATORY_CARE_PROVIDER_SITE_OTHER)
Admission: RE | Admit: 2016-02-21 | Discharge: 2016-02-21 | Disposition: A | Discharge status: Home / Self Care | Payer: BLUE CROSS/BLUE SHIELD | Source: Ambulatory Visit | Attending: Cardiology | Admitting: Cardiology

## 2016-02-21 DIAGNOSIS — R0602 Shortness of breath: Secondary | ICD-10-CM

## 2016-02-24 NOTE — Addendum Note (Signed)
Addended by: Evans LanceSTOVER, Lashanna Angelo W on: 02/24/2016 04:38 PM   Modules accepted: Orders

## 2016-02-27 ENCOUNTER — Other Ambulatory Visit: Payer: BLUE CROSS/BLUE SHIELD

## 2016-02-27 DIAGNOSIS — Z125 Encounter for screening for malignant neoplasm of prostate: Secondary | ICD-10-CM | POA: Diagnosis not present

## 2016-02-27 DIAGNOSIS — Z131 Encounter for screening for diabetes mellitus: Secondary | ICD-10-CM | POA: Diagnosis not present

## 2016-02-27 DIAGNOSIS — E785 Hyperlipidemia, unspecified: Secondary | ICD-10-CM | POA: Diagnosis not present

## 2016-02-27 DIAGNOSIS — Z79899 Other long term (current) drug therapy: Secondary | ICD-10-CM | POA: Diagnosis not present

## 2016-02-28 LAB — COMPREHENSIVE METABOLIC PANEL
A/G RATIO: 1.5 (ref 1.2–2.2)
ALBUMIN: 4.2 g/dL (ref 3.5–5.5)
ALK PHOS: 73 IU/L (ref 39–117)
ALT: 23 IU/L (ref 0–44)
AST: 18 IU/L (ref 0–40)
BILIRUBIN TOTAL: 0.3 mg/dL (ref 0.0–1.2)
BUN / CREAT RATIO: 19 (ref 9–20)
BUN: 17 mg/dL (ref 6–24)
CHLORIDE: 101 mmol/L (ref 96–106)
CO2: 24 mmol/L (ref 18–29)
Calcium: 9.1 mg/dL (ref 8.7–10.2)
Creatinine, Ser: 0.91 mg/dL (ref 0.76–1.27)
GFR calc Af Amer: 113 mL/min/{1.73_m2} (ref >59)
GFR calc non Af Amer: 98 mL/min/{1.73_m2} (ref >59)
Globulin, Total: 2.8 g/dL (ref 1.5–4.5)
Glucose: 94 mg/dL (ref 65–99)
POTASSIUM: 4.1 mmol/L (ref 3.5–5.2)
SODIUM: 140 mmol/L (ref 134–144)
Total Protein: 7 g/dL (ref 6.0–8.5)

## 2016-02-28 LAB — CBC
HEMATOCRIT: 47.5 % (ref 37.5–51.0)
HEMOGLOBIN: 15.4 g/dL (ref 12.6–17.7)
MCH: 30.7 pg (ref 26.6–33.0)
MCHC: 32.4 g/dL (ref 31.5–35.7)
MCV: 95 fL (ref 79–97)
Platelets: 227 10*3/uL (ref 150–379)
RBC: 5.01 x10E6/uL (ref 4.14–5.80)
RDW: 13.6 % (ref 12.3–15.4)
WBC: 7 10*3/uL (ref 3.4–10.8)

## 2016-02-28 LAB — LIPID PANEL
CHOL/HDL RATIO: 3.1 ratio (ref 0.0–5.0)
Cholesterol, Total: 126 mg/dL (ref 100–199)
HDL: 41 mg/dL (ref >39)
LDL Calculated: 58 mg/dL (ref 0–99)
TRIGLYCERIDES: 134 mg/dL (ref 0–149)
VLDL CHOLESTEROL CAL: 27 mg/dL (ref 5–40)

## 2016-02-28 LAB — PSA: Prostate Specific Ag, Serum: 1 ng/mL (ref 0.0–4.0)

## 2016-02-28 LAB — HEMOGLOBIN A1C
ESTIMATED AVERAGE GLUCOSE: 114 mg/dL
Hgb A1c MFr Bld: 5.6 % (ref 4.8–5.6)

## 2016-03-02 ENCOUNTER — Telehealth: Payer: Self-pay | Admitting: Cardiology

## 2016-03-02 DIAGNOSIS — R931 Abnormal findings on diagnostic imaging of heart and coronary circulation: Secondary | ICD-10-CM

## 2016-03-02 NOTE — Telephone Encounter (Signed)
Follow-up ° ° ° °Pt is returning the nurses phone call °

## 2016-03-02 NOTE — Telephone Encounter (Signed)
Returned call to patient. Went over lab results with patient. He also asked about his CT cardiac scoring. Notes Recorded by Sharin GravePamela J Fleming, RN on 02/28/2016 at 11:27 AM Left message for pt to c/b for results and further orders ------  Notes Recorded by Rollene RotundaJames Hochrein, MD on 02/25/2016 at 8:23 AM He does have some coronary calcium. I would like for him to have a POET (Plain Old Exercise Treadmill). I will then [place] further. Call Peter [name] with the results and [facility]  Message sent to scheduling to POET and appt in [place].

## 2016-03-02 NOTE — Telephone Encounter (Signed)
Returning call from Friday,he does not know who called.

## 2016-03-02 NOTE — Telephone Encounter (Signed)
No answer. Left message to call back.   

## 2016-03-19 ENCOUNTER — Encounter: Payer: Self-pay | Admitting: *Deleted

## 2016-03-26 ENCOUNTER — Telehealth (HOSPITAL_COMMUNITY): Payer: Self-pay

## 2016-03-26 NOTE — Telephone Encounter (Signed)
Encounter complete. 

## 2016-03-31 ENCOUNTER — Ambulatory Visit (HOSPITAL_COMMUNITY)
Admission: RE | Admit: 2016-03-31 | Discharge: 2016-03-31 | Disposition: A | Discharge status: Home / Self Care | Payer: BLUE CROSS/BLUE SHIELD | Source: Ambulatory Visit | Attending: Cardiovascular Disease | Admitting: Cardiovascular Disease

## 2016-03-31 ENCOUNTER — Encounter (INDEPENDENT_AMBULATORY_CARE_PROVIDER_SITE_OTHER): Payer: Self-pay

## 2016-03-31 DIAGNOSIS — R931 Abnormal findings on diagnostic imaging of heart and coronary circulation: Secondary | ICD-10-CM

## 2016-03-31 DIAGNOSIS — I2510 Atherosclerotic heart disease of native coronary artery without angina pectoris: Secondary | ICD-10-CM | POA: Insufficient documentation

## 2016-03-31 LAB — EXERCISE TOLERANCE TEST
CSEPED: 11 min
CSEPEDS: 59 s
CSEPHR: 101 %
CSEPPHR: 173 {beats}/min
Estimated workload: 13.4 METS
MPHR: 170 {beats}/min
RPE: 16
Rest HR: 66 {beats}/min

## 2016-04-07 ENCOUNTER — Encounter: Payer: Self-pay | Admitting: Cardiology

## 2016-04-07 ENCOUNTER — Ambulatory Visit (INDEPENDENT_AMBULATORY_CARE_PROVIDER_SITE_OTHER): Payer: BLUE CROSS/BLUE SHIELD | Admitting: Cardiology

## 2016-04-07 VITALS — BP 113/72 | HR 78 | Ht 68.5 in | Wt 162.0 lb

## 2016-04-07 DIAGNOSIS — I2510 Atherosclerotic heart disease of native coronary artery without angina pectoris: Secondary | ICD-10-CM | POA: Diagnosis not present

## 2016-04-07 DIAGNOSIS — R931 Abnormal findings on diagnostic imaging of heart and coronary circulation: Secondary | ICD-10-CM

## 2016-04-07 MED ORDER — ASPIRIN EC 81 MG PO TBEC: 81.0000 mg | DELAYED_RELEASE_TABLET | Freq: Every day | ORAL | 3 refills | Status: DC

## 2016-04-07 NOTE — Progress Notes (Signed)
Cardiology Office Note   Date:  04/07/2016   ID:  Peter [name], DOB 09/02/1965, MRN [id number]  PCP:  Mechele ClaudeSTACKS,WARREN, MD  Cardiologist:   Rollene RotundaJames Ellery Meroney, MD   Chief Complaint  Patient presents with  . Coronary calcium      History of Present Illness: Peter GlassingJeffrey D [name] is a 50 y.o. male who presents for follow-up of significant cardiovascular risk factors.  He has a very strong family history of early coronary disease in his father and brothers. After our initial visit I sent him for a coronary calcium score which demonstrated a small amount of calcium in his LAD.  POET (Plain Old Exercise Treadmill) after this was negative for ischemia.  He returns to discuss this.  He has had no recent symptoms.  The patient denies any new symptoms such as chest discomfort, neck or arm discomfort. There has been no new shortness of breath, PND or orthopnea. There have been no reported palpitations, presyncope or syncope.m.   Past Medical History:  Diagnosis Date  . Degenerative disc disease, lumbar    Dr. Shelle IronBeane  . Dyslipidemia   . GERD (gastroesophageal reflux disease)   . Helicobacter pylori antibody positive 2006-7   treated with antibiotics    Past Surgical History:  Procedure Laterality Date  . ESOPHAGOGASTRODUODENOSCOPY  2007   Dr. Evette CristalGanem  . NASAL SINUS SURGERY       Current Outpatient Prescriptions  Medication Sig Dispense Refill  . albuterol (PROVENTIL HFA;VENTOLIN HFA) 108 (90 BASE) MCG/ACT inhaler Inhale 2 puffs into the lungs every 6 (six) hours as needed for wheezing. 1 Inhaler 1  . ibuprofen (ADVIL) 200 MG tablet Take 600 mg by mouth 2 (two) times daily as needed for moderate pain (pain). For pain    . aspirin EC 81 MG tablet Take 1 tablet (81 mg total) by mouth daily. 90 tablet 3   No current facility-administered medications for this visit.     Allergies:   Review of patient's allergies indicates no known allergies.    ROS:  Please see the history of present illness.    Otherwise, review of systems are positive for none.   All other systems are reviewed and negative.    PHYSICAL EXAM: VS:  BP 113/72   Pulse 78   Ht 5' 8.5" (1.74 m)   Wt 162 lb (73.5 kg)   BMI 24.27 kg/m  , BMI Body mass index is 24.27 kg/m. GENERAL:  Well appearing NECK:  No jugular venous distention, waveform within normal limits, carotid upstroke brisk and symmetric, no bruits, no thyromegaly LUNGS:  Clear to auscultation bilaterally BACK:  No CVA tenderness CHEST:  Unremarkable HEART:  PMI not displaced or sustained,S1 and S2 within normal limits, no S3, no S4, no clicks, no rubs, no murmurs ABD:  Flat, positive bowel sounds normal in frequency in pitch, no bruits, no rebound, no guarding, no midline pulsatile mass, no hepatomegaly, no splenomegaly EXT:  2 plus pulses throughout, no edema, no cyanosis no clubbing    EKG:  EKG is not ordered today.   Recent Labs: 02/27/2016: ALT 23; BUN 17; Creatinine, Ser 0.91; Platelets 227; Potassium 4.1; Sodium 140    Lipid Panel    Component Value Date/Time   CHOL 126 02/27/2016 0000   TRIG 134 02/27/2016 0000   HDL 41 02/27/2016 0000   CHOLHDL 3.1 02/27/2016 0000   LDLCALC 58 02/27/2016 0000      Wt Readings from Last 3 Encounters:  04/07/16 162 lb (  73.5 kg)  02/14/16 159 lb (72.1 kg)  10/24/13 157 lb 3.2 oz (71.3 kg)      Other studies Reviewed: Additional studies/ records that were reviewed today include: None. Review of the above records demonstrates:     ASSESSMENT AND PLAN:  CORONARY CALCIUM:    He will pursue risk reduction.  His 10 year risk of events is high enough to suggest using 81 mg ASA.  Other than this he needs to exercise.  No other studies are needed.  I will do a POET (Plain Old Exercise Treadmill) again in 1 year.  I did review his lipids and he has such a good ratio that I see no indication for a statin.       Current medicines are reviewed at length with the patient today.  The patient does not  have concerns regarding medicines.  The following changes have been made:  None  Labs/ tests ordered today include:   Orders Placed This Encounter  Procedures  . Exercise Tolerance Test     Disposition:   FU with me one year.     Signed, Rollene RotundaJames Gwendalynn Eckstrom, MD  04/07/2016 5:37 PM    [facility]

## 2016-04-07 NOTE — Patient Instructions (Signed)
Medication Instructions:  Your physician recommends that you continue on your current medications as directed. Please refer to the Current Medication list given to you today.  Labwork: None ordered  Testing/Procedures: Your physician has requested that you have an exercise tolerance test. For further information please visit [email or web address]. Please also follow instruction sheet, as given.  Follow-Up: Your physician recommends that you schedule a follow-up appointment after TREADMILL TEST HAS BEEN COMPLETED WITH DR [facility] IN 1 YEAR.   Any Other Special Instructions Will Be Listed Below (If Applicable).     If you need a refill on your cardiac medications before your next appointment, please call your pharmacy.

## 2016-04-29 ENCOUNTER — Ambulatory Visit (INDEPENDENT_AMBULATORY_CARE_PROVIDER_SITE_OTHER): Payer: BLUE CROSS/BLUE SHIELD | Admitting: Family Medicine

## 2016-04-29 ENCOUNTER — Encounter: Payer: Self-pay | Admitting: Family Medicine

## 2016-04-29 VITALS — BP 126/78 | HR 72 | Temp 98.1°F | Ht 68.5 in | Wt 163.5 lb

## 2016-04-29 DIAGNOSIS — J4520 Mild intermittent asthma, uncomplicated: Secondary | ICD-10-CM | POA: Diagnosis not present

## 2016-04-29 MED ORDER — ALBUTEROL SULFATE HFA 108 (90 BASE) MCG/ACT IN AERS: 2.0000 | INHALATION_SPRAY | RESPIRATORY_TRACT | 11 refills | Status: DC | PRN

## 2016-04-29 NOTE — Progress Notes (Signed)
Subjective:  Patient ID: Peter Peter [name], male    DOB: 10/01/1965  Age: 50 y.o. MRN: [id number]  CC: Referral (pt here today requesting a referral to asthma specialist )   HPI Peter Peter [name] presents for Evaluation for possible asthma. She notes that annually between March and April he will have symptoms referable to asthma including sniffles cough sneezing and wheezing at times. That usually resolves promptly after the spring time. However this year it is been ongoing. He gets spells where it becomes difficult to breathe that last a few moments. 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 This Peter [name] responded in the past to albuterol inhaler. He would like referral to an asthma specialist for evaluation.   History Peter Peter [name] a past medical history of Degenerative disc disease, lumbar; Dyslipidemia; GERD (gastroesophageal reflux disease); and Helicobacter pylori antibody positive (2006-7).   He Peter [name] a past surgical history that includes Nasal sinus surgery and Esophagogastroduodenoscopy (2007).   His family history includes Colon polyps in his brother and mother; Diabetes in his brother and father; Heart attack (age of onset: 3547) in his brother; Heart disease (age of onset: 2759) in his brother; Heart disease (age of onset: 2665) in his father; Prostate cancer in his brother.He reports that he Peter [name] never smoked. He Peter [name] never used smokeless tobacco. He reports that he does not drink alcohol or use drugs.    ROS Review of Systems  Constitutional: Negative for chills, diaphoresis and fever.  HENT: Positive for congestion, postnasal drip, rhinorrhea and sneezing. Negative for sore throat.   Respiratory: Negative for cough and shortness of breath.   Cardiovascular: Negative for chest pain.  Musculoskeletal: Negative for arthralgias and myalgias.  Skin: Negative for rash.  Allergic/Immunologic: Positive for environmental allergies. Negative for  food allergies and immunocompromised state.  Neurological: Negative for weakness and headaches.    Objective:  BP 126/78   Pulse 72   Temp 98.1 F (36.7 C) (Oral)   Ht 5' 8.5" (1.74 m)   Wt 163 lb 8 oz (74.2 kg)   SpO2 96%   BMI 24.50 kg/m   BP Readings from Last 3 Encounters:  04/29/16 126/78  04/07/16 113/72  02/14/16 112/80    Wt Readings from Last 3 Encounters:  04/29/16 163 lb 8 oz (74.2 kg)  04/07/16 162 lb (73.5 kg)  02/14/16 159 lb (72.1 kg)     Physical Exam  Constitutional: He is oriented to person, place, and time. He appears well-developed and well-nourished.  HENT:  Head: Normocephalic and atraumatic.  Right Ear: Tympanic membrane and external ear normal. No decreased hearing is noted.  Left Ear: Tympanic membrane and external ear normal. No decreased hearing is noted.  Mouth/Throat: No oropharyngeal exudate or posterior oropharyngeal erythema.  Eyes: Pupils are equal, round, and reactive to light.  Neck: Normal range of motion. Neck supple.  Cardiovascular: Normal rate and regular rhythm.   No murmur heard. Pulmonary/Chest: Breath sounds normal. No respiratory distress.  Abdominal: Soft. Bowel sounds are normal. He exhibits no mass. There is no tenderness.  Musculoskeletal: Normal range of motion.  Neurological: He is alert and oriented to person, place, and time.  Skin: Skin is warm and dry.  Psychiatric: He Peter [name] a normal mood and affect.  Vitals reviewed.    Lab Results  Component Value Date   WBC 7.0 02/27/2016   HGB 17.7 (H) 07/23/2011   HCT 47.5 02/27/2016   PLT 227 02/27/2016  GLUCOSE 94 02/27/2016   CHOL 126 02/27/2016   TRIG 134 02/27/2016   HDL 41 02/27/2016   LDLCALC 58 02/27/2016   ALT 23 02/27/2016   AST 18 02/27/2016   NA 140 02/27/2016   K 4.1 02/27/2016   CL 101 02/27/2016   CREATININE 0.91 02/27/2016   BUN 17 02/27/2016   CO2 24 02/27/2016   HGBA1C 5.6 02/27/2016    No results found.  Assessment & Plan:   Peter [name]  was seen today for referral.  Diagnoses and all orders for this visit:  Asthma, mild intermittent, uncomplicated -     Ambulatory referral to Pulmonology  Other orders -     albuterol (PROVENTIL HFA;VENTOLIN HFA) 108 (90 Base) MCG/ACT inhaler; Inhale 2 puffs into the lungs every 4 (four) hours as needed for wheezing or shortness of breath.    Exam is essentially normal today but symptoms have been shown to be episodic.  I have discontinued Peter Peter [name] ibuprofen and albuterol. I am also having him start on albuterol. Additionally, I am having him maintain his aspirin EC.  Meds ordered this encounter  Medications  . albuterol (PROVENTIL HFA;VENTOLIN HFA) 108 (90 Base) MCG/ACT inhaler    Sig: Inhale 2 puffs into the lungs every 4 (four) hours as needed for wheezing or shortness of breath.    Dispense:  1 Inhaler    Refill:  11     Follow-up: No Follow-up on file.  Peter Peter [name], M.D.

## 2016-05-13 ENCOUNTER — Ambulatory Visit (INDEPENDENT_AMBULATORY_CARE_PROVIDER_SITE_OTHER)
Admission: RE | Admit: 2016-05-13 | Discharge: 2016-05-13 | Disposition: A | Discharge status: Home / Self Care | Payer: BLUE CROSS/BLUE SHIELD | Source: Ambulatory Visit | Attending: Pulmonary Disease | Admitting: Pulmonary Disease

## 2016-05-13 ENCOUNTER — Ambulatory Visit (INDEPENDENT_AMBULATORY_CARE_PROVIDER_SITE_OTHER): Payer: BLUE CROSS/BLUE SHIELD | Admitting: Pulmonary Disease

## 2016-05-13 ENCOUNTER — Other Ambulatory Visit (INDEPENDENT_AMBULATORY_CARE_PROVIDER_SITE_OTHER): Payer: BLUE CROSS/BLUE SHIELD

## 2016-05-13 ENCOUNTER — Encounter: Payer: Self-pay | Admitting: Pulmonary Disease

## 2016-05-13 VITALS — BP 118/70 | HR 68 | Ht 68.5 in | Wt 161.8 lb

## 2016-05-13 DIAGNOSIS — R0602 Shortness of breath: Secondary | ICD-10-CM | POA: Diagnosis not present

## 2016-05-13 DIAGNOSIS — R0600 Dyspnea, unspecified: Secondary | ICD-10-CM

## 2016-05-13 LAB — CBC WITH DIFFERENTIAL/PLATELET
BASOS ABS: 0 10*3/uL (ref 0.0–0.1)
Basophils Relative: 0.3 % (ref 0.0–3.0)
EOS ABS: 0.1 10*3/uL (ref 0.0–0.7)
Eosinophils Relative: 1.4 % (ref 0.0–5.0)
HCT: 43.8 % (ref 39.0–52.0)
Hemoglobin: 14.6 g/dL (ref 13.0–17.0)
LYMPHS PCT: 32.2 % (ref 12.0–46.0)
Lymphs Abs: 2.4 10*3/uL (ref 0.7–4.0)
MCHC: 33.4 g/dL (ref 30.0–36.0)
MCV: 92.3 fl (ref 78.0–100.0)
Monocytes Absolute: 0.8 10*3/uL (ref 0.1–1.0)
Monocytes Relative: 10.1 % (ref 3.0–12.0)
NEUTROS PCT: 56 % (ref 43.0–77.0)
Neutro Abs: 4.2 10*3/uL (ref 1.4–7.7)
Platelets: 203 10*3/uL (ref 150.0–400.0)
RBC: 4.75 Mil/uL (ref 4.22–5.81)
RDW: 12.9 % (ref 11.5–15.5)
WBC: 7.5 10*3/uL (ref 4.0–10.5)

## 2016-05-13 LAB — NITRIC OXIDE: Nitric Oxide: 8

## 2016-05-13 NOTE — Progress Notes (Signed)
A few years back              Margaretha GlassingJeffrey D Pilling    [id number]    06/27/1966  Primary Care Physician:STACKS,WARREN, MD  Referring Physician: Mechele ClaudeWarren Stacks, MD [address] [place], [place] [address]  Chief complaint:  Consult for evaluation of dyspnea, asthma.  HPI: Mr. Peter [name] is a 50 year old with past history of asthma. He was diagnosed about 5 years ago. His symptoms mostly occur in the spring season around March and last for 3-4 weeks. This is associated with dyspnea, cough, non-productive in nature. He has occasional wheezing. He had been using albuterol for short times in march with resolution of symptoms. This year however he reports that his symptoms have remained persistent until now.  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 He is not allergic to pets, cats, dogs, dust, pollen. He does not have any mold issues at home. He denies any rhinitis, postnasal drip, sinusitis or GERD symptoms.   Outpatient Encounter Prescriptions as of 05/13/2016  Medication Sig  . albuterol (PROVENTIL HFA;VENTOLIN HFA) 108 (90 Base) MCG/ACT inhaler Inhale 2 puffs into the lungs every 4 (four) hours as needed for wheezing or shortness of breath.  Marland Kitchen. aspirin EC 81 MG tablet Take 1 tablet (81 mg total) by mouth daily. (Patient not taking: Reported on 05/13/2016)   No facility-administered encounter medications on file as of 05/13/2016.     Allergies as of 05/13/2016  . (No Known Allergies)    Past Medical History:  Diagnosis Date  . Degenerative disc disease, lumbar    Dr. Shelle IronBeane  . Dyslipidemia   . GERD (gastroesophageal reflux disease)   . Helicobacter pylori antibody positive 2006-7   treated with antibiotics    Past Surgical History:  Procedure Laterality Date  . ESOPHAGOGASTRODUODENOSCOPY  2007   Dr. Evette CristalGanem  . NASAL SINUS SURGERY      Family History  Problem Relation Age of Onset  . Colon polyps Mother   . Diabetes Father   . Heart disease Father 4765  . Heart  disease Brother 3059    Died age 50  . Heart attack Brother 8547    Alive 3 heart attackes at age 50  . Prostate cancer Brother   . Colon polyps Brother   . Diabetes Brother     Social History   Social History  . Marital status: Married    Spouse name: N/A  . Number of children: 2  . Years of education: N/A   Occupational History  . 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 Smoking status: Never Smoker  . Smokeless tobacco: Never Used  . Alcohol use No  . Drug use: No  . Sexual activity: Not on file   Other Topics Concern  . Not on file   Social History Narrative   Married   Two sons born 1995 and 2000     Review of systems: Review of Systems  Constitutional: Negative for fever and chills.  HENT: Negative.   Eyes: Negative for blurred vision.  Respiratory: as per HPI  Cardiovascular: Negative for chest pain and palpitations.  Gastrointestinal: Negative for vomiting, diarrhea, blood per rectum. Genitourinary: Negative for dysuria, urgency, frequency and hematuria.  Musculoskeletal: Negative for myalgias, back pain and joint pain.  Skin: Negative for itching and rash.  Neurological: Negative for dizziness, tremors, focal weakness, seizures and loss of consciousness.  Endo/Heme/Allergies: Negative for environmental  allergies.  Psychiatric/Behavioral: Negative for depression, suicidal ideas and hallucinations.  All other systems reviewed and are negative.   Physical Exam: Blood pressure 118/70, pulse 68, height 5' 8.5" (1.74 m), weight 73.4 kg (161 lb 12.8 oz), SpO2 98 %. Gen:      No acute distress HEENT:  EOMI, sclera anicteric Neck:     No masses; no thyromegaly Lungs:    Clear to auscultation bilaterally; normal respiratory effort CV:         Regular rate and rhythm; no murmurs Abd:      + bowel sounds; soft, non-tender; no palpable masses, no distension Ext:    No edema; adequate peripheral perfusion Skin:      Warm and dry; no rash Neuro: alert  and oriented x 3 Psych: normal mood and affect  Data Reviewed:   Assessment:  Evaluation for dyspnea, asthma.  His symptoms are ususally controlled after the spring allergy season but have persisted this year. I will evaluate by getting a FENO, chest x-ray, full PFTs. He also get a blood count with differential and a blood allergy profile for further evaluation. He will continue the albuterol PRN.  Plan/Recommendations: - Continue albuterol PRN - Check FENO, CBC with diff and blood allergy profile. - Schedule PFTs and Chest x ray,  Return to clinic in 1 month to review tests and plan for further tests, meds as needed.    Chilton Greathouse MD [facility] Pager [phone] 05/13/2016, 1:56 PM  CC: Mechele Claude, MD

## 2016-05-13 NOTE — Patient Instructions (Signed)
We will check a FENO, PFTs. We will schedule you for a chest x-ray,  Blood test for CBC with differential, blood allergy profile.  Return to clinic in 1 month.

## 2016-05-14 LAB — RESPIRATORY ALLERGY PROFILE REGION II ~~LOC~~
Allergen, C. Herbarum, M2: <0.1 kU/L
Allergen, Cottonwood, t14: <0.1 kU/L
Allergen, D pternoyssinus,d7: <0.1 kU/L
Allergen, Mulberry, t76: <0.1 kU/L
Allergen, Oak,t7: <0.1 kU/L
Bermuda Grass: <0.1 kU/L
Box Elder IgE: <0.1 kU/L
Cat Dander: <0.1 kU/L
Cockroach: <0.1 kU/L
D. farinae: <0.1 kU/L
Dog Dander: <0.1 kU/L
Elm IgE: <0.1 kU/L
IGE (IMMUNOGLOBULIN E), SERUM: 10 kU/L (ref <115)
Johnson Grass: <0.1 kU/L
Pecan/Hickory Tree IgE: <0.1 kU/L
Sheep Sorrel IgE: <0.1 kU/L
Timothy Grass: <0.1 kU/L

## 2016-05-18 ENCOUNTER — Telehealth: Payer: Self-pay | Admitting: Pulmonary Disease

## 2016-05-18 NOTE — Progress Notes (Signed)
Called spoke with patient, advised of lab results / recs as stated by PM.  Pt verbalized his understanding and denied any questions.

## 2016-05-18 NOTE — Telephone Encounter (Signed)
Spoke with patient about lab and cxr results: Result Notes  Notes Recorded by Rinaldo Ratel, CMA on 05/18/2016 at 4:48 PM EDT Called spoke with patient, advised of cxr results / recs as stated by PM.  Pt verbalized his understanding and denied any questions.  ------  Notes Recorded by Marshell Garfinkel, MD on 05/18/2016 at 8:22 AM EDT Please let the patient know that the chest x ray does not show any abnormality.  --------------------------------------------------------------------------------------------------------------------------------------------------- Notes Recorded by Rinaldo Ratel, CMA on 05/18/2016 at 4:49 PM EDT Called spoke with patient, advised of lab results / recs as stated by PM.  Pt verbalized his understanding and denied any questions. ------  Notes Recorded by Marshell Garfinkel, MD on 05/18/2016 at 8:23 AM EDT Please let the patient know his labs are normal.    When speaking with patient, he is concerned about the cost of his upcoming PFT on 10.25.17 - he has not yet met dis insurance deductible.  PM please advise, thank you.

## 2016-05-18 NOTE — Progress Notes (Signed)
Called spoke with patient, advised of cxr results / recs as stated by PM.  Pt verbalized his understanding and denied any questions. 

## 2016-05-19 NOTE — Telephone Encounter (Signed)
We can hold off PFTs till next year or consider just spirometry at next office visit.

## 2016-05-19 NOTE — Telephone Encounter (Signed)
LVM for pt to return call

## 2016-05-25 NOTE — Telephone Encounter (Signed)
Spoke with pt and advised of PM's ok to wait on PFT until next year. Pt agrees. Appt cancelled. Nothing further needed.

## 2016-05-25 NOTE — Telephone Encounter (Signed)
LMTCB

## 2016-06-08 ENCOUNTER — Telehealth: Payer: Self-pay | Admitting: Family Medicine

## 2016-06-08 NOTE — Telephone Encounter (Signed)
Called [facility] and told them we hadn't received the request yet on Mr. Peter [name].

## 2016-06-17 ENCOUNTER — Ambulatory Visit: Payer: BLUE CROSS/BLUE SHIELD | Admitting: Pulmonary Disease

## 2016-06-17 ENCOUNTER — Encounter (HOSPITAL_COMMUNITY): Payer: BLUE CROSS/BLUE SHIELD

## 2016-07-14 DIAGNOSIS — H10412 Chronic giant papillary conjunctivitis, left eye: Secondary | ICD-10-CM | POA: Diagnosis not present

## 2016-11-03 DIAGNOSIS — M67431 Ganglion, right wrist: Secondary | ICD-10-CM | POA: Diagnosis not present

## 2016-11-03 DIAGNOSIS — M25531 Pain in right wrist: Secondary | ICD-10-CM | POA: Diagnosis not present

## 2017-01-07 DIAGNOSIS — M25531 Pain in right wrist: Secondary | ICD-10-CM | POA: Diagnosis not present

## 2017-01-07 DIAGNOSIS — M67431 Ganglion, right wrist: Secondary | ICD-10-CM | POA: Diagnosis not present

## 2017-01-13 ENCOUNTER — Encounter: Payer: Self-pay | Admitting: Family Medicine

## 2017-01-13 ENCOUNTER — Ambulatory Visit (INDEPENDENT_AMBULATORY_CARE_PROVIDER_SITE_OTHER): Payer: BLUE CROSS/BLUE SHIELD | Admitting: Family Medicine

## 2017-01-13 ENCOUNTER — Telehealth: Payer: Self-pay | Admitting: Family Medicine

## 2017-01-13 ENCOUNTER — Other Ambulatory Visit: Payer: Self-pay | Admitting: Family Medicine

## 2017-01-13 VITALS — BP 115/78 | HR 81 | Ht 68.5 in | Wt 164.0 lb

## 2017-01-13 DIAGNOSIS — H6501 Acute serous otitis media, right ear: Secondary | ICD-10-CM

## 2017-01-13 MED ORDER — AZITHROMYCIN 250 MG PO TABS: ORAL_TABLET | ORAL | 0 refills | Status: DC

## 2017-01-13 MED ORDER — CEFPROZIL 500 MG PO TABS: 500.0000 mg | ORAL_TABLET | Freq: Two times a day (BID) | ORAL | 0 refills | Status: DC

## 2017-01-13 NOTE — Telephone Encounter (Signed)
Please tell the patient I sent in a substitute antibiotic, Zithromax. I am very aware of his concerns about diarrhea. And I'm trying to choose something with the least risk possible of diarrhea. However, that's a common side effect of virtually every antibiotic on the market. Due to his concern about diarrhea I recommend he start taking probiotics along with his antibiotic. Any good brand of that can be recommended by his pharmacist.

## 2017-01-13 NOTE — Telephone Encounter (Signed)
Pt aware new Rx sent in as well as recommendation of taking probiotic

## 2017-01-13 NOTE — Progress Notes (Signed)
   HPI  Patient presents today for mild right ear pain for a couple of days. Then last night it became severe waking him up. Naproxen did help last night. It has not impacted his hearing but it is quite painful. He has no known injury. He has not had any other upper respiratory symptoms including congestion and rhinorrhea sneezing etc. He has no sore throat. He denies fever chills sweats and cough.  PMH: Smoking status noted ROS: Per HPI  Objective: BP 115/78   Pulse 81   Ht 5' 8.5" (1.74 m)   Wt 164 lb (74.4 kg)   BMI 24.57 kg/m  Gen: NAD, alert, cooperative with exam HEENT: NCAT, EOMI, PERRL. Injected erythematous right TM with serous fluid posteriorly. CV: RRR, good S1/S2, no murmur Resp: CTABL, no wheezes, non-labored Abd: SNTND, BS present, no guarding or organomegaly Ext: No edema, warm Neuro: Alert and oriented, No gross deficits  Assessment and plan:  1. Right acute serous otitis media, recurrence not specified     Meds ordered this encounter  Medications  . cefPROZIL (CEFZIL) 500 MG tablet    Sig: Take 1 tablet (500 mg total) by mouth 2 (two) times daily. For infection. Take all of this medication.    Dispense:  20 tablet    Refill:  0      Follow-up when necessary  Mechele ClaudeWarren Jamey Demchak, MD

## 2017-01-16 DIAGNOSIS — M25531 Pain in right wrist: Secondary | ICD-10-CM | POA: Diagnosis not present

## 2017-01-21 DIAGNOSIS — M25531 Pain in right wrist: Secondary | ICD-10-CM | POA: Diagnosis not present

## 2017-01-21 DIAGNOSIS — M67431 Ganglion, right wrist: Secondary | ICD-10-CM | POA: Diagnosis not present

## 2017-02-03 DIAGNOSIS — M67431 Ganglion, right wrist: Secondary | ICD-10-CM | POA: Diagnosis not present

## 2017-03-31 DIAGNOSIS — M25531 Pain in right wrist: Secondary | ICD-10-CM | POA: Diagnosis not present

## 2017-03-31 DIAGNOSIS — M67431 Ganglion, right wrist: Secondary | ICD-10-CM | POA: Diagnosis not present

## 2017-06-03 ENCOUNTER — Telehealth (HOSPITAL_COMMUNITY): Payer: Self-pay

## 2017-06-03 NOTE — Telephone Encounter (Signed)
Encounter complete. 

## 2017-06-08 ENCOUNTER — Ambulatory Visit (HOSPITAL_COMMUNITY)
Admission: RE | Admit: 2017-06-08 | Discharge: 2017-06-08 | Disposition: A | Discharge status: Home / Self Care | Payer: BLUE CROSS/BLUE SHIELD | Source: Ambulatory Visit | Attending: Cardiology | Admitting: Cardiology

## 2017-06-08 DIAGNOSIS — Z23 Encounter for immunization: Secondary | ICD-10-CM | POA: Diagnosis not present

## 2017-06-08 DIAGNOSIS — R931 Abnormal findings on diagnostic imaging of heart and coronary circulation: Secondary | ICD-10-CM | POA: Diagnosis not present

## 2017-06-08 LAB — EXERCISE TOLERANCE TEST
CSEPED: 12 min
CSEPHR: 104 %
CSEPPHR: 176 {beats}/min
Estimated workload: 13.4 METS
Exercise duration (sec): 0 s
MPHR: 169 {beats}/min
RPE: 18
Rest HR: 75 {beats}/min

## 2017-06-08 NOTE — Progress Notes (Signed)
Cardiology Office Note   Date:  06/14/2017   ID:  Peter [name], DOB 1966-05-06, MRN [id number]  PCP:  Mechele Claude, MD  Cardiologist:   Rollene Rotunda, MD   Chief Complaint  Patient presents with  . Coronary Calcium      History of Present Illness: Peter [name] is a 51 y.o. male who presents for follow-up of significant cardiovascular risk factors.  He has a very strong family history of early coronary disease in his father and brothers. After our initial visit I sent him for a coronary calcium score which demonstrated a small amount of calcium in his LAD.  POET (Plain Old Exercise Treadmill) after this was negative for ischemia. He had a repeat POET (Plain Old Exercise Treadmill) this week and he had an excellent exercise tolerance and no ischemia noted.  He feels well.  The patient denies any new symptoms such as chest discomfort, neck or arm discomfort. There has been no new shortness of breath, PND or orthopnea. There have been no reported palpitations, presyncope or syncope.  He gets up to 11K steps per day but is not exercising as much as I would like.  He does eat pretty well by his report.    Past Medical History:  Diagnosis Date  . Degenerative disc disease, lumbar    Dr. Shelle Iron  . Dyslipidemia   . GERD (gastroesophageal reflux disease)   . Helicobacter pylori antibody positive 2006-7   treated with antibiotics    Past Surgical History:  Procedure Laterality Date  . ESOPHAGOGASTRODUODENOSCOPY  2007   Dr. Evette Cristal  . NASAL SINUS SURGERY       Current Outpatient Prescriptions  Medication Sig Dispense Refill  . albuterol (PROVENTIL HFA;VENTOLIN HFA) 108 (90 Base) MCG/ACT inhaler Inhale 2 puffs into the lungs every 4 (four) hours as needed for wheezing or shortness of breath. 1 Inhaler 11  . aspirin EC 81 MG tablet Take 1 tablet (81 mg total) by mouth daily. 90 tablet 3   No current facility-administered medications for this visit.     Allergies:   Patient  has no known allergies.    ROS:  Please see the history of present illness.   Otherwise, review of systems are positive for none.   All other systems are reviewed and negative.    PHYSICAL EXAM: VS:  BP 98/60   Pulse 76   Ht 5' 8.5" (1.74 m)   Wt 165 lb (74.8 kg)   BMI 24.72 kg/m  , BMI Body mass index is 24.72 kg/m.  GENERAL:  Well appearing NECK:  No jugular venous distention, waveform within normal limits, carotid upstroke brisk and symmetric, no bruits, no thyromegaly LUNGS:  Clear to auscultation bilaterally CHEST:  Unremarkable HEART:  PMI not displaced or sustained,S1 and S2 within normal limits, no S3, no S4, no clicks, no rubs, no murmurs ABD:  Flat, positive bowel sounds normal in frequency in pitch, no bruits, no rebound, no guarding, no midline pulsatile mass, no hepatomegaly, no splenomegaly EXT:  2 plus pulses throughout, no edema, no cyanosis no clubbing   EKG:  EKG is not ordered today.   Recent Labs: No results found for requested labs within last 8760 hours.    Lipid Panel    Component Value Date/Time   CHOL 126 02/27/2016 0000   TRIG 134 02/27/2016 0000   HDL 41 02/27/2016 0000   CHOLHDL 3.1 02/27/2016 0000   LDLCALC 58 02/27/2016 0000      Wt  Readings from Last 3 Encounters:  06/09/17 165 lb (74.8 kg)  01/13/17 164 lb (74.4 kg)  05/13/16 161 lb 12.8 oz (73.4 kg)      Other studies Reviewed: Additional studies/ records that were reviewed today include: POET (Plain Old Exercise Treadmill). Review of the above records demonstrates:     ASSESSMENT AND PLAN:  CORONARY CALCIUM:   MESA score is 4.8%.  He had an excellent exercise tolerance on POET (Plain Old Exercise Treadmill) with no ischemia.  No further testing is indicated or change in therapy.  He does not have a risk score that does not indicate a need for statin or ASA.  He needs exercise and we again discussed this.  I will also follow up on a lipid profile this year.      Current  medicines are reviewed at length with the patient today.  The patient does not have concerns regarding medicines.  The following changes have been made:  None   Labs/ tests ordered today include: None   Orders Placed This Encounter  Procedures  . Lipid panel     Disposition:   FU with me one year.     Signed, Rollene Rotunda, MD  06/14/2017 9:11 AM    [facility]

## 2017-06-09 ENCOUNTER — Encounter: Payer: Self-pay | Admitting: Cardiology

## 2017-06-09 ENCOUNTER — Ambulatory Visit (INDEPENDENT_AMBULATORY_CARE_PROVIDER_SITE_OTHER): Payer: BLUE CROSS/BLUE SHIELD | Admitting: Cardiology

## 2017-06-09 VITALS — BP 98/60 | HR 76 | Ht 68.5 in | Wt 165.0 lb

## 2017-06-09 DIAGNOSIS — E785 Hyperlipidemia, unspecified: Secondary | ICD-10-CM | POA: Diagnosis not present

## 2017-06-09 DIAGNOSIS — R931 Abnormal findings on diagnostic imaging of heart and coronary circulation: Secondary | ICD-10-CM | POA: Diagnosis not present

## 2017-06-09 NOTE — Patient Instructions (Signed)
Medication Instructions:  Continue current medications  If you need a refill on your cardiac medications before your next appointment, please call your pharmacy.  Labwork: Fasting Lipids HERE IN OUR OFFICE AT [facility]  Testing/Procedures: None Ordered   Follow-Up: Your physician wants you to follow-up in: 1 Year. You should receive a reminder letter in the mail two months in advance. If you do not receive a letter, please call our office [phone].   Thank you for choosing [facility]!!      

## 2017-06-14 ENCOUNTER — Encounter: Payer: Self-pay | Admitting: Cardiology

## 2017-06-14 DIAGNOSIS — R931 Abnormal findings on diagnostic imaging of heart and coronary circulation: Secondary | ICD-10-CM | POA: Insufficient documentation

## 2017-06-14 DIAGNOSIS — E785 Hyperlipidemia, unspecified: Secondary | ICD-10-CM | POA: Insufficient documentation

## 2017-06-19 ENCOUNTER — Encounter (HOSPITAL_BASED_OUTPATIENT_CLINIC_OR_DEPARTMENT_OTHER): Payer: Self-pay | Admitting: Emergency Medicine

## 2017-06-19 ENCOUNTER — Emergency Department (HOSPITAL_BASED_OUTPATIENT_CLINIC_OR_DEPARTMENT_OTHER): Payer: BLUE CROSS/BLUE SHIELD

## 2017-06-19 DIAGNOSIS — R0781 Pleurodynia: Secondary | ICD-10-CM | POA: Diagnosis not present

## 2017-06-19 DIAGNOSIS — R109 Unspecified abdominal pain: Secondary | ICD-10-CM | POA: Insufficient documentation

## 2017-06-19 DIAGNOSIS — S299XXA Unspecified injury of thorax, initial encounter: Secondary | ICD-10-CM | POA: Diagnosis not present

## 2017-06-19 DIAGNOSIS — Z5321 Procedure and treatment not carried out due to patient leaving prior to being seen by health care provider: Secondary | ICD-10-CM | POA: Insufficient documentation

## 2017-06-19 NOTE — ED Triage Notes (Signed)
Pt presents with c/o left flank pain after fall today.

## 2017-06-20 ENCOUNTER — Emergency Department (HOSPITAL_BASED_OUTPATIENT_CLINIC_OR_DEPARTMENT_OTHER)
Admission: EM | Admit: 2017-06-20 | Discharge: 2017-06-20 | Disposition: A | Discharge status: Home / Self Care | Payer: BLUE CROSS/BLUE SHIELD | Attending: Emergency Medicine | Admitting: Emergency Medicine

## 2017-06-22 ENCOUNTER — Encounter: Payer: Self-pay | Admitting: Family Medicine

## 2017-06-22 ENCOUNTER — Ambulatory Visit (INDEPENDENT_AMBULATORY_CARE_PROVIDER_SITE_OTHER): Payer: BLUE CROSS/BLUE SHIELD | Admitting: Family Medicine

## 2017-06-22 VITALS — BP 125/80 | HR 80 | Temp 97.0°F | Ht 68.0 in | Wt 166.0 lb

## 2017-06-22 DIAGNOSIS — S2232XA Fracture of one rib, left side, initial encounter for closed fracture: Secondary | ICD-10-CM | POA: Diagnosis not present

## 2017-06-22 NOTE — Patient Instructions (Signed)
Rib Fracture ° °A rib fracture is a break or crack in one of the bones of the ribs. The ribs are a group of long, curved bones that wrap around your chest and attach to your spine. They protect your lungs and other organs in the chest cavity. A broken or cracked rib is often painful, but most do not cause other problems. Most rib fractures heal on their own over time. However, rib fractures can be more serious if multiple ribs are broken or if broken ribs move out of place and push against other structures. °What are the causes? °· A direct blow to the chest. For example, this could happen during contact sports, a car accident, or a fall against a hard object. °· Repetitive movements with high force, such as pitching a baseball or having severe coughing spells. °What are the signs or symptoms? °· Pain when you breathe in or cough. °· Pain when someone presses on the injured area. °How is this diagnosed? °Your caregiver will perform a physical exam. Various imaging tests may be ordered to confirm the diagnosis and to look for related injuries. These tests may include a chest X-ray, computed tomography (CT), magnetic resonance imaging (MRI), or a bone scan. °How is this treated? °Rib fractures usually heal on their own in 1-3 months. The longer healing period is often associated with a continued cough or other aggravating activities. During the healing period, pain control is very important. Medication is usually given to control pain. Hospitalization or surgery may be needed for more severe injuries, such as those in which multiple ribs are broken or the ribs have moved out of place. °Follow these instructions at home: °· Avoid strenuous activity and any activities or movements that cause pain. Be careful during activities and avoid bumping the injured rib. °· Gradually increase activity as directed by your caregiver. °· Only take over-the-counter or prescription medications as directed by your caregiver. Do not take  other medications without asking your caregiver first. °· Apply ice to the injured area for the first 1-2 days after you have been treated or as directed by your caregiver. Applying ice helps to reduce inflammation and pain. °? Put ice in a plastic bag. °? Place a towel between your skin and the bag. °? Leave the ice on for 15-20 minutes at a time, every 2 hours while you are awake. °· Perform deep breathing as directed by your caregiver. This will help prevent pneumonia, which is a common complication of a broken rib. Your caregiver may instruct you to: °? Take deep breaths several times a day. °? Try to cough several times a day, holding a pillow against the injured area. °? Use a device called an incentive spirometer to practice deep breathing several times a day. °· Drink enough fluids to keep your urine clear or pale yellow. This will help you avoid constipation. °· Do not wear a rib belt or binder. These restrict breathing, which can lead to pneumonia. °Get help right away if: °· You have a fever. °· You have difficulty breathing or shortness of breath. °· You develop a continual cough, or you cough up thick or bloody sputum. °· You feel sick to your stomach (nausea), throw up (vomit), or have abdominal pain. °· You have worsening pain not controlled with medications. °This information is not intended to replace advice given to you by your health care provider. Make sure you discuss any questions you have with your health care provider. °Document Released: 08/10/2005   Document Revised: 01/16/2016 Document Reviewed: 10/12/2012 °Elsevier Interactive Patient Education © 2018 [employer] ° °

## 2017-06-22 NOTE — Progress Notes (Signed)
Subjective: CC: "cracked rib" PCP: Mechele Claude, MD [id number] Peter [name] is a 51 y.o. male presenting to clinic today for:  1. Rib pain Patient was seen in the emergency department on 06/20/2017 after a fall with complaints of left flank pain.  He had a chest x-ray which revealed a possible nondisplaced fracture of the left ninth rib.  He left before seeing a provider in the emergency department.  He reports to the office today noting slight increase pain of the left lower ribs.  He describes his fall as a mechanical fall and notes that he slipped on some water on his steps.  No loss of consciousness.  Did not hit his head.  Denies shortness of breath.  Slight worsening of pain in his left ribs with very deep breathing.  No hemoptysis.  No nausea, vomiting.  He has been taking naproxen 500 mg twice daily as needed for pain.  He reports moderate relief with this.  He also has Percocet at home which she has not used yet but will if the pain becomes severe enough.  No alcohol use.  No Known Allergies Past Medical History:  Diagnosis Date  . Degenerative disc disease, lumbar    Dr. Shelle Iron  . Dyslipidemia   . GERD (gastroesophageal reflux disease)   . Helicobacter pylori antibody positive 2006-7   treated with antibiotics   Family History  Problem Relation Age of Onset  . Colon polyps Mother   . Diabetes Father   . Heart disease Father 53  . Heart disease Brother 44       Died age 77  . Heart attack Brother 31       Alive 3 heart attackes at age 36  . Prostate cancer Brother   . Colon polyps Brother   . Diabetes Brother    No current outpatient prescriptions on file.  Social Hx: non smoker.  Health Maintenance: Flu shot already done  ROS: Per HPI  Objective: Office vital signs reviewed. BP 125/80   Pulse 80   Temp (!) 97 F (36.1 C) (Oral)   Ht 5\' 8"  (1.727 m)   Wt 166 lb (75.3 kg)   BMI 25.24 kg/m   Physical Examination:  General: Awake, alert, well nourished, No  acute distress Cardio: regular rate and rhythm, S1S2 heard, no murmurs appreciated Pulm: clear to auscultation bilaterally, no wheezes, rhonchi or rales; normal work of breathing on room air MSK: Slight dysfunction in exhalation of the left ribs.  He has tenderness to palpation along the posterior ribs 9 through 11.  He also has anterior rib pain along rib 9. Skin: Small half dollar sized yellow bruise on the posterior aspect of the left side of the thorax.  Assessment/ Plan: 51 y.o. male   1. Closed fracture of one rib of left side, initial encounter I reviewed the x-ray report.  We reviewed home care instructions for nondisplaced rib fracture.  Patient's cardiopulmonary exam was completely unremarkable.  I suspect that the rib fracture continues to be nondisplaced.  He does have tenderness to palpation along the area of concern on the left side.  I agree with continuing naproxen 500 mg twice daily as needed pain.  Continue icing area as long as there is pain.  May use topical analgesic of choice.  Okay to use home Percocet if needed.  I did caution sedation.  Patient declined muscle relaxer today.Strict return precautions and reasons for emergent evaluation in the emergency department review with patient.  They  voiced understanding and will follow-up as needed.  No orders of the defined types were placed in this encounter.  No orders of the defined types were placed in this encounter.    Raliegh IpAshly M Gottschalk, DO [facility] [phone]

## 2017-08-10 DIAGNOSIS — H66002 Acute suppurative otitis media without spontaneous rupture of ear drum, left ear: Secondary | ICD-10-CM | POA: Diagnosis not present

## 2018-01-04 ENCOUNTER — Encounter: Payer: Self-pay | Admitting: Physician Assistant

## 2018-01-04 ENCOUNTER — Ambulatory Visit (INDEPENDENT_AMBULATORY_CARE_PROVIDER_SITE_OTHER): Payer: BLUE CROSS/BLUE SHIELD | Admitting: Physician Assistant

## 2018-01-04 VITALS — BP 119/81 | HR 110 | Temp 100.3°F | Ht 68.0 in | Wt 164.0 lb

## 2018-01-04 DIAGNOSIS — J0100 Acute maxillary sinusitis, unspecified: Secondary | ICD-10-CM | POA: Diagnosis not present

## 2018-01-04 MED ORDER — AMOXICILLIN 875 MG PO TABS: 875.0000 mg | ORAL_TABLET | Freq: Two times a day (BID) | ORAL | 0 refills | Status: DC

## 2018-01-04 NOTE — Patient Instructions (Signed)

## 2018-01-04 NOTE — Progress Notes (Signed)
  Subjective:     Patient ID: LUE DUBUQUE, male   DOB: November 30, 1965, 52 y.o.   MRN: [id number]  HPI Pt with sinus pain and pressure for 2-3 days Hx of same in the past Has used Mucinex for sx  Review of Systems  Constitutional: Positive for chills, fatigue and fever. Negative for activity change and appetite change.  HENT: Positive for congestion, nosebleeds, postnasal drip, sinus pressure and sinus pain. Negative for ear pain, facial swelling, rhinorrhea, sneezing and sore throat.   Eyes: Negative.   Respiratory: Positive for cough. Negative for shortness of breath and wheezing.   Cardiovascular: Negative.   Gastrointestinal: Negative.        Objective:   Physical Exam  Constitutional: He appears well-developed and well-nourished.  HENT:  Mouth/Throat: Oropharynx is clear and moist.  + maxillary sinus TTP  Neck: Neck supple.  Cardiovascular: Normal rate and normal heart sounds.  Pulmonary/Chest: Breath sounds normal.  Lymphadenopathy:    He has no cervical adenopathy.  Nursing note and vitals reviewed.      Assessment:     Maxillary sinus    Plan:     Amox  bid x 10 days Fluids  Rest OTC antihist/decongest Saline nasal spray

## 2018-04-21 DIAGNOSIS — N400 Enlarged prostate without lower urinary tract symptoms: Secondary | ICD-10-CM | POA: Diagnosis not present

## 2018-04-21 DIAGNOSIS — Z125 Encounter for screening for malignant neoplasm of prostate: Secondary | ICD-10-CM | POA: Diagnosis not present

## 2018-12-17 ENCOUNTER — Telehealth: Payer: Self-pay | Admitting: Cardiology

## 2018-12-17 NOTE — Telephone Encounter (Signed)
LVMTCB to schedule from appt from recall list with Dr. Hochrein.  °

## 2018-12-30 NOTE — Telephone Encounter (Signed)
LMTCB to schedule appt from recall list with Dr Hochrein °

## 2019-02-22 ENCOUNTER — Telehealth: Payer: Self-pay | Admitting: *Deleted

## 2019-02-22 NOTE — Telephone Encounter (Signed)
Unable to leave a message, mail box full. 

## 2019-03-31 IMAGING — CR DG CHEST 2V
2 series · 2 of 2 positions shown · non-contrast
Comparison: Radiograph dated 05/13/2016

CLINICAL DATA: 51-year-old male with fall left rib pain.

EXAM:
CHEST  2 VIEW

[w chest pa]
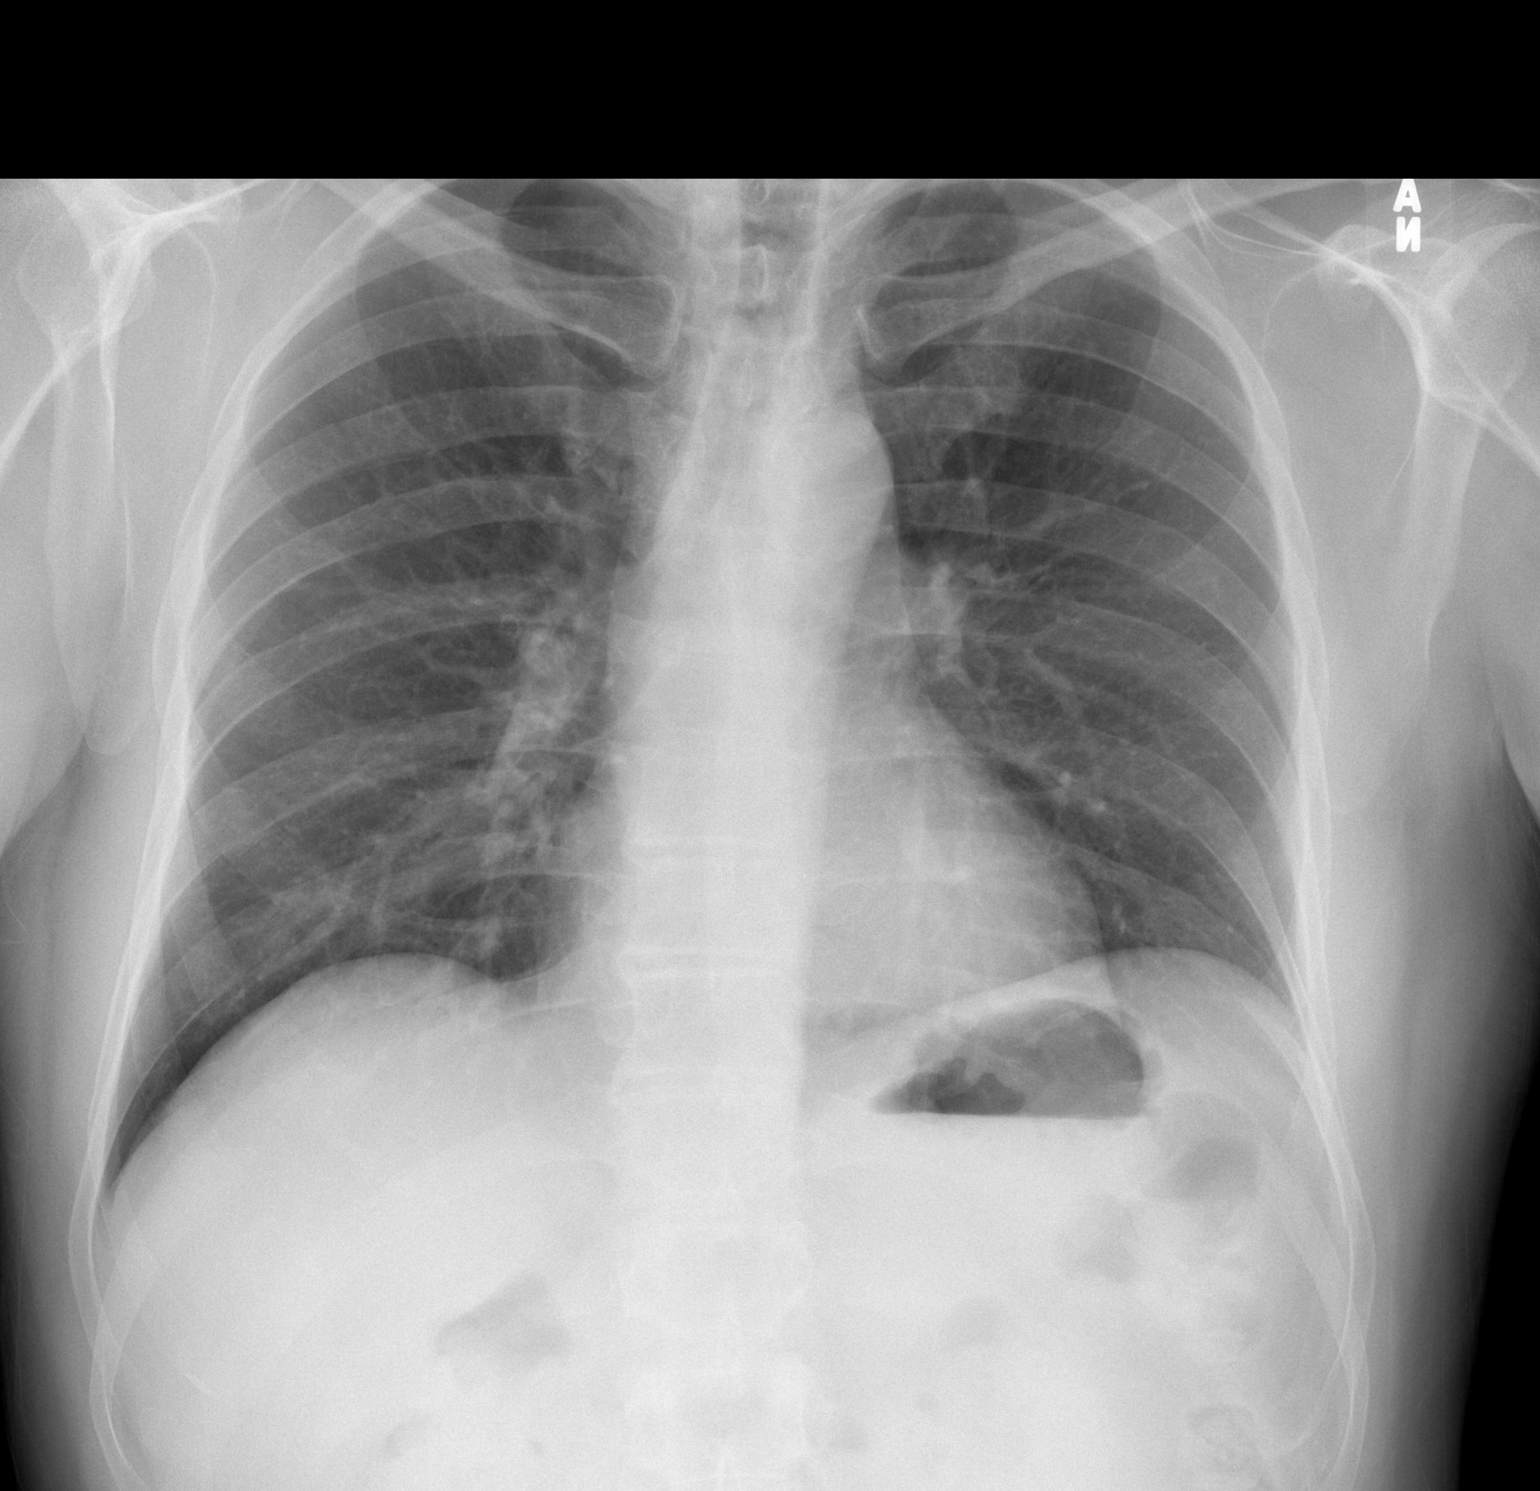

[w chest lat]
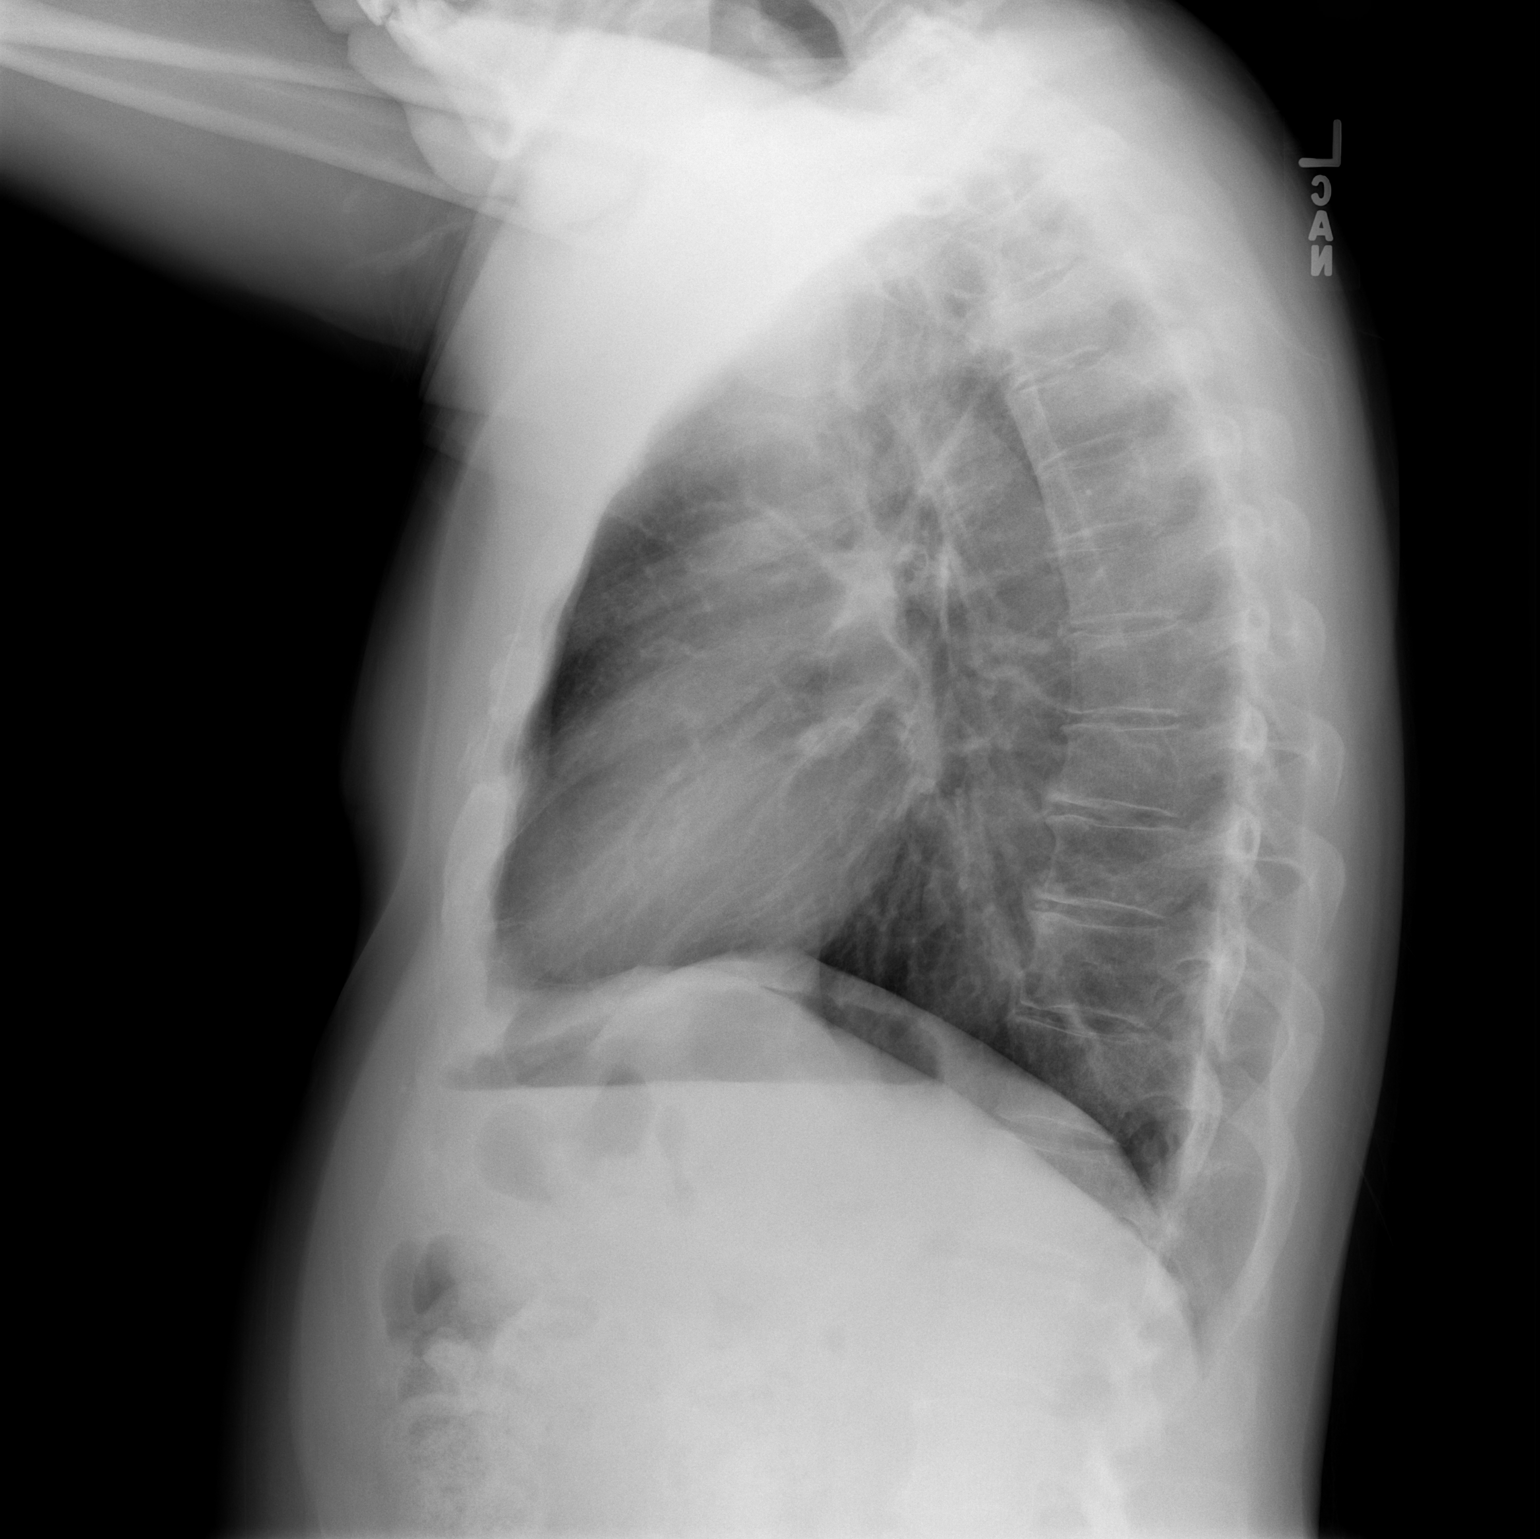

[2 of 2 positions shown; findings below may reference images not displayed]

FINDINGS: The lungs are clear. There is no pleural effusion or pneumothorax.
The cardiac silhouette is within normal limits. There is apparent
cortical discontinuity of lateral aspect of the left ninth rib
concerning for a nondisplaced fracture. Correlation with point
tenderness recommended.
IMPRESSION: 1. No acute cardiopulmonary process.
2. Possible nondisplaced fracture of the lateral aspect of the left
ninth rib. Correlation with point tenderness recommended.

## 2019-04-18 DIAGNOSIS — N400 Enlarged prostate without lower urinary tract symptoms: Secondary | ICD-10-CM | POA: Diagnosis not present

## 2019-04-24 DIAGNOSIS — N400 Enlarged prostate without lower urinary tract symptoms: Secondary | ICD-10-CM | POA: Diagnosis not present

## 2019-05-11 ENCOUNTER — Telehealth: Payer: Self-pay | Admitting: Cardiology

## 2019-05-11 DIAGNOSIS — E785 Hyperlipidemia, unspecified: Secondary | ICD-10-CM

## 2019-05-11 DIAGNOSIS — R931 Abnormal findings on diagnostic imaging of heart and coronary circulation: Secondary | ICD-10-CM

## 2019-05-11 NOTE — Telephone Encounter (Signed)
Called patient no answer.Left message on personal voice mail he can have fasting lab bmet,lipid and hepatic panels done 3 to 5 days before Oct appointment.No lab appointment needed.Advised no food after midnight of having lab.Lab opens 8:00 am to 12:00 noon.

## 2019-05-11 NOTE — Telephone Encounter (Signed)
Patient would like to have labs done prior to his upcoming appt with Dr. Percival Spanish on 10/7.

## 2019-05-25 ENCOUNTER — Other Ambulatory Visit: Payer: BC Managed Care – PPO

## 2019-05-25 ENCOUNTER — Other Ambulatory Visit: Payer: Self-pay

## 2019-05-25 DIAGNOSIS — R931 Abnormal findings on diagnostic imaging of heart and coronary circulation: Secondary | ICD-10-CM | POA: Diagnosis not present

## 2019-05-25 DIAGNOSIS — E785 Hyperlipidemia, unspecified: Secondary | ICD-10-CM | POA: Diagnosis not present

## 2019-05-26 LAB — HEPATIC FUNCTION PANEL
ALT: 23 IU/L (ref 0–44)
AST: 17 IU/L (ref 0–40)
Albumin: 4.1 g/dL (ref 3.8–4.9)
Alkaline Phosphatase: 82 IU/L (ref 39–117)
Bilirubin Total: 0.4 mg/dL (ref 0.0–1.2)
Bilirubin, Direct: 0.12 mg/dL (ref 0.00–0.40)
Total Protein: 6.8 g/dL (ref 6.0–8.5)

## 2019-05-26 LAB — BASIC METABOLIC PANEL
BUN/Creatinine Ratio: 13 (ref 9–20)
BUN: 13 mg/dL (ref 6–24)
CO2: 27 mmol/L (ref 20–29)
Calcium: 8.9 mg/dL (ref 8.7–10.2)
Chloride: 102 mmol/L (ref 96–106)
Creatinine, Ser: 0.99 mg/dL (ref 0.76–1.27)
GFR calc Af Amer: 100 mL/min/{1.73_m2} (ref >59)
GFR calc non Af Amer: 87 mL/min/{1.73_m2} (ref >59)
Glucose: 84 mg/dL (ref 65–99)
Potassium: 3.7 mmol/L (ref 3.5–5.2)
Sodium: 141 mmol/L (ref 134–144)

## 2019-05-26 LAB — LIPID PANEL
Chol/HDL Ratio: 3.4 ratio (ref 0.0–5.0)
Cholesterol, Total: 112 mg/dL (ref 100–199)
HDL: 33 mg/dL — ABNORMAL LOW (ref >39)
LDL Chol Calc (NIH): 48 mg/dL (ref 0–99)
Triglycerides: 187 mg/dL — ABNORMAL HIGH (ref 0–149)
VLDL Cholesterol Cal: 31 mg/dL (ref 5–40)

## 2019-05-30 NOTE — Progress Notes (Signed)
Cardiology Office Note   Date:  05/31/2019   ID:  SLAYDEN MENNENGA, DOB [birth year], MRN [id number]  PCP:  Mechele Claude, MD  Cardiologist:   Rollene Rotunda, MD    Chief Complaint  Patient presents with  . Elevated Coronary Calcium     History of Present Illness: Peter [name] is a 53 y.o. male who presents for follow-up of significant cardiovascular risk factors.  He has a very strong family history of early coronary disease in his father and brothers. After our initial visit I sent him for a coronary calcium score which demonstrated a small amount of calcium in his LAD.  POET (Plain Old Exercise Treadmill) after this was negative for ischemia. He had a repeat POET (Plain Old Exercise Treadmill) in 2018 and he had an excellent exercise tolerance and no ischemia noted.    Since I last saw him he has done well.  He is not exercising as much as I would like but he does do some walking with his wife.  With this he denies any chest pressure, neck or arm discomfort.  He has no palpitations, presyncope or syncope.  He has no shortness of breath, PND or orthopnea.  He is willing to do pounds.  He has had no edema.  Past Medical History:  Diagnosis Date  . Degenerative disc disease, lumbar    Dr. Shelle Iron  . Dyslipidemia   . GERD (gastroesophageal reflux disease)   . Helicobacter pylori antibody positive 2006-7   treated with antibiotics    Past Surgical History:  Procedure Laterality Date  . ESOPHAGOGASTRODUODENOSCOPY  2007   Dr. Evette Cristal  . NASAL SINUS SURGERY       No current outpatient medications on file.   No current facility-administered medications for this visit.     Allergies:   Patient has no known allergies.    ROS:  Please see the history of present illness.   Otherwise, review of systems are positive for none.   All other systems are reviewed and negative.    PHYSICAL EXAM: VS:  BP 122/80   Pulse 66   Temp (!) 97 F (36.1 C)   Ht 5\' 8"  (1.727 m)   Wt 168 lb  (76.2 kg)   SpO2 95%   BMI 25.54 kg/m  , BMI Body mass index is 25.54 kg/m.  GENERAL:  Well appearing NECK:  No jugular venous distention, waveform within normal limits, carotid upstroke brisk and symmetric, no bruits, no thyromegaly LUNGS:  Clear to auscultation bilaterally CHEST:  Unremarkable HEART:  PMI not displaced or sustained,S1 and S2 within normal limits, no S3, no S4, no clicks, no rubs, no murmurs ABD:  Flat, positive bowel sounds normal in frequency in pitch, no bruits, no rebound, no guarding, no midline pulsatile mass, no hepatomegaly, no splenomegaly EXT:  2 plus pulses throughout, no edema, no cyanosis no clubbing    EKG:  EKG is not ordered today. Sinus rhythm, rate 66, axis within normal limits, intervals within normal limits, no acute ST-T wave changes.  Recent Labs: 05/25/2019: ALT 23; BUN 13; Creatinine, Ser 0.99; Potassium 3.7; Sodium 141    Lipid Panel    Component Value Date/Time   CHOL 112 05/25/2019 0831   TRIG 187 (H) 05/25/2019 0831   HDL 33 (L) 05/25/2019 0831   CHOLHDL 3.4 05/25/2019 0831   LDLCALC 48 05/25/2019 0831      Wt Readings from Last 3 Encounters:  05/31/19 168 lb (76.2 kg)  01/04/18  164 lb (74.4 kg)  06/22/17 166 lb (75.3 kg)      Other studies Reviewed: Additional studies/ records that were reviewed today include: Labs Review of the above records demonstrates: see below   ASSESSMENT AND PLAN:  CORONARY CALCIUM:   MESA score is 4.8%.  Since his POET (Plain Old Exercise Treadmill)   At this point no further testing is indicated.  I would consider stress testing in a few years or if he has any symptoms.  We spent long time talking about the need to increase his exercise.  He has significant risk factors with his family history.   DYSLIPIDEMIA:   LDL was 58 with the HDL somewhat low.  Will increase his exercise.  Current medicines are reviewed at length with the patient today.  The patient does not have concerns regarding  medicines.  The following changes have been made:  None  Labs/ tests ordered today include: None  Orders Placed This Encounter  Procedures  . EKG 12-Lead     Disposition:   FU with me one year.     Signed, Minus Breeding, MD  05/31/2019 2:58 PM    [facility]

## 2019-05-31 ENCOUNTER — Encounter: Payer: Self-pay | Admitting: Cardiology

## 2019-05-31 ENCOUNTER — Ambulatory Visit (INDEPENDENT_AMBULATORY_CARE_PROVIDER_SITE_OTHER): Payer: BC Managed Care – PPO | Admitting: Cardiology

## 2019-05-31 ENCOUNTER — Other Ambulatory Visit: Payer: Self-pay

## 2019-05-31 VITALS — BP 122/80 | HR 66 | Temp 97.0°F | Ht 68.0 in | Wt 168.0 lb

## 2019-05-31 DIAGNOSIS — R931 Abnormal findings on diagnostic imaging of heart and coronary circulation: Secondary | ICD-10-CM | POA: Diagnosis not present

## 2019-05-31 NOTE — Patient Instructions (Addendum)
Medication Instructions:  Your physician recommends that you continue on your current medications as directed. Please refer to the Current Medication list given to you today.  If you need a refill on your cardiac medications before your next appointment, please call your pharmacy.   Lab work: NONE  Testing/Procedures: NONE  Follow-Up: At [facility], you and your health needs are our priority.  As part of our continuing mission to provide you with exceptional heart care, we have created designated Provider Care Teams.  These Care Teams include your primary Cardiologist (physician) and Advanced Practice Providers (APPs -  Physician Assistants and Nurse Practitioners) who all work together to provide you with the care you need, when you need it. You will need a follow up appointment in 12 months.  Please call our office 2 months in advance to schedule this appointment.  You may see Dr. Hochrein or one of the following Advanced Practice Providers on your designated Care Team:   Rhonda Barrett, PA-C Kathryn Lawrence, DNP, ANP      

## 2019-06-06 DIAGNOSIS — Z23 Encounter for immunization: Secondary | ICD-10-CM | POA: Diagnosis not present

## 2019-06-13 DIAGNOSIS — D229 Melanocytic nevi, unspecified: Secondary | ICD-10-CM | POA: Diagnosis not present

## 2019-06-13 DIAGNOSIS — L219 Seborrheic dermatitis, unspecified: Secondary | ICD-10-CM | POA: Diagnosis not present

## 2019-11-30 DIAGNOSIS — Z23 Encounter for immunization: Secondary | ICD-10-CM | POA: Diagnosis not present

## 2019-12-22 DIAGNOSIS — Z23 Encounter for immunization: Secondary | ICD-10-CM | POA: Diagnosis not present

## 2020-11-14 DIAGNOSIS — Z125 Encounter for screening for malignant neoplasm of prostate: Secondary | ICD-10-CM | POA: Diagnosis not present

## 2020-11-14 DIAGNOSIS — N400 Enlarged prostate without lower urinary tract symptoms: Secondary | ICD-10-CM | POA: Diagnosis not present

## 2020-12-24 ENCOUNTER — Encounter: Payer: Self-pay | Admitting: Internal Medicine

## 2020-12-24 ENCOUNTER — Ambulatory Visit (INDEPENDENT_AMBULATORY_CARE_PROVIDER_SITE_OTHER): Payer: BC Managed Care – PPO | Admitting: Internal Medicine

## 2020-12-24 ENCOUNTER — Other Ambulatory Visit: Payer: Self-pay

## 2020-12-24 VITALS — BP 112/74 | HR 70 | Ht 68.0 in | Wt 171.0 lb

## 2020-12-24 DIAGNOSIS — Z1211 Encounter for screening for malignant neoplasm of colon: Secondary | ICD-10-CM | POA: Diagnosis not present

## 2020-12-24 DIAGNOSIS — R12 Heartburn: Secondary | ICD-10-CM | POA: Diagnosis not present

## 2020-12-24 MED ORDER — NA SULFATE-K SULFATE-MG SULF 17.5-3.13-1.6 GM/177ML PO SOLN: 1.0000 | Freq: Once | ORAL | 0 refills | Status: AC

## 2020-12-24 NOTE — Progress Notes (Signed)
   Peter [name] 55 y.o. 29-Apr-1966 [id number]  Assessment & Plan:   Encounter Diagnoses  Name Primary?  . Colon cancer screening Yes  . Heartburn     I think it is appropriate to perform a screening colonoscopy.  He is not necessarily at increased risk given the family history that I am aware of but it is on his mind and it is almost 10 years.  The risks and benefits as well as alternatives of endoscopic procedure(s) have been discussed and reviewed. All questions answered. The patient agrees to proceed.  Continue as needed antacid/H2 blocker and work on lifestyle management for heartburn.   Subjective:   Chief Complaint: Colonoscopy question  HPI Patient is a 55 year old white man with a history of a negative colonoscopy regarding rectal bleeding in 2013 who has a family history where 2 brothers have had polyps 1 brother had 67 polyps on an initial colonoscopy in his early 25s I think, and another has had multiple polyps over the years.  The patient is wondering whether he should have a colonoscopy now.  He is not having any active GI symptoms other than occasional heartburn related to dietary indiscretion.  Treats with Tums or Pepcid.    No Known Allergies Current Meds  Medication Sig  . calcium carbonate (TUMS - DOSED IN MG ELEMENTAL CALCIUM) 500 MG chewable tablet Chew 1 tablet by mouth as needed for indigestion or heartburn.  . famotidine (PEPCID) 20 MG tablet Take 20 mg by mouth as needed for heartburn or indigestion.  . Na Sulfate-K Sulfate-Mg Sulf 17.5-3.13-1.6 GM/177ML SOLN Take 1 kit by mouth once for 1 dose.   Past Medical History:  Diagnosis Date  . Degenerative disc disease, lumbar    Dr. Tonita Cong  . Dyslipidemia   . GERD (gastroesophageal reflux disease)   . Helicobacter pylori antibody positive 2006-7   treated with antibiotics   Past Surgical History:  Procedure Laterality Date  . COLONOSCOPY  2013  . ESOPHAGOGASTRODUODENOSCOPY  2007   Dr. Penelope Coop  .  NASAL SINUS SURGERY     Social History   Social History Narrative   Married   Two sons born 1995 and 2000   Work - real estate   Never smoker no alcohol tobacco or drug use   family history includes Colon polyps in his brother and mother; Diabetes in his brother and father; Heart attack (age of onset: 65) in his brother; Heart disease (age of onset: 32) in his brother; Heart disease (age of onset: 92) in his father; Prostate cancer in his brother.   Review of Systems  As per HPI  Objective:   Physical Exam $RemoveBefor'@BP'WmbZnUFryYxu$  112/74   Pulse 70   Ht $R'5\' 8"'YZ$  (1.727 m)   Wt 171 lb (77.6 kg)   SpO2 97%   BMI 26.00 kg/m @  General:  NAD Eyes:   anicteric Lungs:  clear Heart::  S1S2 no rubs, murmurs or gallops

## 2020-12-24 NOTE — Patient Instructions (Addendum)
If you are age 55 or older, your body mass index should be between 23-30. Your Body mass index is 26 kg/m. If this is out of the aforementioned range listed, please consider follow up with your Primary Care Provider.  If you are age 73 or younger, your body mass index should be between 19-25. Your Body mass index is 26 kg/m. If this is out of the aformentioned range listed, please consider follow up with your Primary Care Provider.   You have been scheduled for a colonoscopy. Please follow written instructions given to you at your visit today.  Please pick up your prep supplies at the pharmacy within the next 1-3 days. If you use inhalers (even only as needed), please bring them with you on the day of your procedure.  Follow up pending the results of your Colonoscopy.

## 2021-01-07 ENCOUNTER — Ambulatory Visit (INDEPENDENT_AMBULATORY_CARE_PROVIDER_SITE_OTHER): Payer: BC Managed Care – PPO | Admitting: Dermatology

## 2021-01-07 ENCOUNTER — Encounter: Payer: Self-pay | Admitting: Dermatology

## 2021-01-07 ENCOUNTER — Other Ambulatory Visit: Payer: Self-pay

## 2021-01-07 DIAGNOSIS — Z1283 Encounter for screening for malignant neoplasm of skin: Secondary | ICD-10-CM

## 2021-01-07 DIAGNOSIS — L570 Actinic keratosis: Secondary | ICD-10-CM | POA: Diagnosis not present

## 2021-01-14 ENCOUNTER — Other Ambulatory Visit: Payer: Self-pay

## 2021-01-14 ENCOUNTER — Ambulatory Visit (INDEPENDENT_AMBULATORY_CARE_PROVIDER_SITE_OTHER): Payer: BC Managed Care – PPO | Admitting: Nurse Practitioner

## 2021-01-14 ENCOUNTER — Encounter: Payer: Self-pay | Admitting: Nurse Practitioner

## 2021-01-14 VITALS — BP 125/82 | HR 69 | Temp 97.1°F | Ht 68.0 in | Wt 166.0 lb

## 2021-01-14 DIAGNOSIS — H6502 Acute serous otitis media, left ear: Secondary | ICD-10-CM | POA: Diagnosis not present

## 2021-01-14 MED ORDER — AMOXICILLIN 875 MG PO TABS: 875.0000 mg | ORAL_TABLET | Freq: Two times a day (BID) | ORAL | 0 refills | Status: AC

## 2021-01-14 NOTE — Assessment & Plan Note (Signed)
Symptoms of otitis media of the left ear not well controlled.  Patient is reporting pain and discomfort on deep left-sided.  On assessment tympanic membrane is erythematous. No fever, nausea or vomiting.  No sore throat or flulike symptoms.  Started patient on amoxicillin 875 mg tablet twice daily for 7 days.  Rx sent to pharmacy.  Follow-up with worsening unresolved symptoms.

## 2021-01-14 NOTE — Patient Instructions (Signed)
Otitis Media, Adult  Otitis media is a condition in which the middle ear is red and swollen (inflamed) and full of fluid. The middle ear is the part of the ear that contains bones for hearing as well as air that helps send sounds to the brain. The condition usually goes away on its own. What are the causes? This condition is caused by a blockage in the eustachian tube. The eustachian tube connects the middle ear to the back of the nose. It normally allows air into the middle ear. The blockage is caused by fluid or swelling. Problems that can cause blockage include:  A cold or infection that affects the nose, mouth, or throat.  Allergies.  An irritant, such as tobacco smoke.  Adenoids that have become large. The adenoids are soft tissue located in the back of the throat, behind the nose and the roof of the mouth.  Growth or swelling in the upper part of the throat, just behind the nose (nasopharynx).  Damage to the ear caused by change in pressure. This is called barotrauma. What are the signs or symptoms? Symptoms of this condition include:  Ear pain.  Fever.  Problems with hearing.  Being tired.  Fluid leaking from the ear.  Ringing in the ear. How is this treated? This condition can go away on its own within 3-5 days. But if the condition is caused by bacteria or does not go away on its own, or if it keeps coming back, your doctor may:  Give you antibiotic medicines.  Give you medicines for pain. Follow these instructions at home:  Take over-the-counter and prescription medicines only as told by your doctor.  If you were prescribed an antibiotic medicine, take it as told by your doctor. Do not stop taking the antibiotic even if you start to feel better.  Keep all follow-up visits as told by your doctor. This is important. Contact a doctor if:  You have bleeding from your nose.  There is a lump on your neck.  You are not feeling better in 5 days.  You feel worse  instead of better. Get help right away if:  You have pain that is not helped with medicine.  You have swelling, redness, or pain around your ear.  You get a stiff neck.  You cannot move part of your face (paralysis).  You notice that the bone behind your ear hurts when you touch it.  You get a very bad headache. Summary  Otitis media means that the middle ear is red, swollen, and full of fluid.  This condition usually goes away on its own.  If the problem does not go away, treatment may be needed. You may be given medicines to treat the infection or to treat your pain.  If you were prescribed an antibiotic medicine, take it as told by your doctor. Do not stop taking the antibiotic even if you start to feel better.  Keep all follow-up visits as told by your doctor. This is important. This information is not intended to replace advice given to you by your health care provider. Make sure you discuss any questions you have with your health care provider. Document Revised: 07/13/2019 Document Reviewed: 07/13/2019 Elsevier Patient Education  2021 Elsevier Inc.  

## 2021-01-14 NOTE — Progress Notes (Signed)
Educated-it did sound to negative.  Tolerated  Acute Office Visit  Subjective:    Patient ID: Peter [name], male    DOB: 14-Sep-1965, 55 y.o.   MRN: [id number]  Chief Complaint  Patient presents with  . Otitis Media    Otalgia  There is pain in the left ear. This is a new problem. Episode onset: The last 4 to 5 days. The problem occurs constantly. The problem has been gradually worsening. There has been no fever. The pain is moderate. Pertinent negatives include no headaches, hearing loss, rash or sore throat. He has tried nothing for the symptoms.     Past Medical History:  Diagnosis Date  . Degenerative disc disease, lumbar    Dr. Shelle Iron  . Dyslipidemia   . GERD (gastroesophageal reflux disease)   . Helicobacter pylori antibody positive 2006-7   treated with antibiotics    Past Surgical History:  Procedure Laterality Date  . COLONOSCOPY  2013  . ESOPHAGOGASTRODUODENOSCOPY  2007   Dr. Evette Cristal  . NASAL SINUS SURGERY      Family History  Problem Relation Age of Onset  . Colon polyps Mother   . Diabetes Father   . Heart disease Father 105  . Heart disease Brother 37       Died age 17  . Heart attack Brother 90       Alive 3 heart attackes at age 19  . Prostate cancer Brother   . Colon polyps Brother   . Diabetes Brother   . Colon cancer Neg Hx   . Esophageal cancer Neg Hx     Social History   Socioeconomic History  . Marital status: Married    Spouse name: Not on file  . Number of children: 2  . Years of education: Not on file  . Highest education level: Not on file  Occupational History  . Occupation: Catering manager: [employer]  Tobacco Use  . Smoking status: Never Smoker  . Smokeless tobacco: Never Used  Vaping Use  . Vaping Use: Never used  Substance and Sexual Activity  . Alcohol use: No  . Drug use: No  . Sexual activity: Not on file  Other Topics Concern  . Not on file  Social History Narrative   Married   Two sons born 1995  and 2000   Work - real estate   Never smoker no alcohol tobacco or drug use   Social Determinants of Corporate investment banker Strain: Not on file  Food Insecurity: Not on file  Transportation Needs: Not on file  Physical Activity: Not on file  Stress: Not on file  Social Connections: Not on file  Intimate Partner Violence: Not on file    Outpatient Medications Prior to Visit  Medication Sig Dispense Refill  . famotidine (PEPCID) 20 MG tablet Take 20 mg by mouth as needed for heartburn or indigestion.    . calcium carbonate (TUMS - DOSED IN MG ELEMENTAL CALCIUM) 500 MG chewable tablet Chew 1 tablet by mouth as needed for indigestion or heartburn.     No facility-administered medications prior to visit.    No Known Allergies  Review of Systems  Constitutional: Negative.   HENT: Positive for ear pain. Negative for hearing loss and sore throat.   Cardiovascular: Negative.   Gastrointestinal: Negative.   Musculoskeletal: Negative.   Skin: Negative for rash.  Neurological: Negative for headaches.  All other systems reviewed and are negative.  Objective:    Physical Exam Vitals and nursing note reviewed.  Constitutional:      Appearance: Normal appearance.  HENT:     Head: Normocephalic.     Right Ear: There is no impacted cerumen.     Left Ear: Tenderness present. A middle ear effusion is present. There is no impacted cerumen. Tympanic membrane is erythematous.     Nose: Nose normal.     Mouth/Throat:     Mouth: Mucous membranes are moist.     Pharynx: Oropharynx is clear.  Eyes:     Conjunctiva/sclera: Conjunctivae normal.  Cardiovascular:     Rate and Rhythm: Normal rate and regular rhythm.     Pulses: Normal pulses.     Heart sounds: Normal heart sounds.  Pulmonary:     Effort: Pulmonary effort is normal.     Breath sounds: Normal breath sounds.  Abdominal:     General: Bowel sounds are normal.  Neurological:     Mental Status: He is alert and  oriented to person, place, and time.     BP 125/82   Pulse 69   Temp (!) 97.1 F (36.2 C) (Temporal)   Ht 5\' 8"  (1.727 m)   Wt 166 lb (75.3 kg)   SpO2 97%   BMI 25.24 kg/m  Wt Readings from Last 3 Encounters:  01/14/21 166 lb (75.3 kg)  12/24/20 171 lb (77.6 kg)  05/31/19 168 lb (76.2 kg)    Health Maintenance Due  Topic Date Due  . COVID-19 Vaccine (1) Never done  . HIV Screening  Never done  . Hepatitis C Screening  Never done    There are no preventive care reminders to display for this patient.   No results found for: TSH Lab Results  Component Value Date   WBC 7.5 05/13/2016   HGB 14.6 05/13/2016   HCT 43.8 05/13/2016   MCV 92.3 05/13/2016   PLT 203.0 05/13/2016   Lab Results  Component Value Date   NA 141 05/25/2019   K 3.7 05/25/2019   CO2 27 05/25/2019   GLUCOSE 84 05/25/2019   BUN 13 05/25/2019   CREATININE 0.99 05/25/2019   BILITOT 0.4 05/25/2019   ALKPHOS 82 05/25/2019   AST 17 05/25/2019   ALT 23 05/25/2019   PROT 6.8 05/25/2019   ALBUMIN 4.1 05/25/2019   CALCIUM 8.9 05/25/2019   Lab Results  Component Value Date   CHOL 112 05/25/2019   Lab Results  Component Value Date   HDL 33 (L) 05/25/2019   Lab Results  Component Value Date   LDLCALC 48 05/25/2019   Lab Results  Component Value Date   TRIG 187 (H) 05/25/2019   Lab Results  Component Value Date   CHOLHDL 3.4 05/25/2019   Lab Results  Component Value Date   HGBA1C 5.6 02/27/2016       Assessment & Plan:   Problem List Items Addressed This Visit      Nervous and Auditory   Non-recurrent acute serous otitis media of left ear - Primary    Symptoms of otitis media of the left ear not well controlled.  Patient is reporting pain and discomfort on deep left-sided.  On assessment tympanic membrane is erythematous. No fever, nausea or vomiting.  No sore throat or flulike symptoms.  Started patient on amoxicillin 875 mg tablet twice daily for 7 days.  Rx sent to pharmacy.   Follow-up with worsening unresolved symptoms.      Relevant Medications   amoxicillin (  AMOXIL) 875 MG tablet       Meds ordered this encounter  Medications  . amoxicillin (AMOXIL) 875 MG tablet    Sig: Take 1 tablet (875 mg total) by mouth 2 (two) times daily for 7 days.    Dispense:  14 tablet    Refill:  0    Order Specific Question:   Supervising Provider    Answer:   Raliegh Ip [[id number]]     Daryll Drown, NP

## 2021-01-17 ENCOUNTER — Other Ambulatory Visit: Payer: Self-pay | Admitting: Nurse Practitioner

## 2021-01-17 ENCOUNTER — Telehealth: Payer: Self-pay | Admitting: Family Medicine

## 2021-01-17 NOTE — Telephone Encounter (Signed)
Patient aware and verbalizes understanding. 

## 2021-01-17 NOTE — Telephone Encounter (Signed)
Pt here Tuesday and still has earache. amoxicillin (AMOXIL) 875 MG tablet not working. Can something else be called in? [facility]

## 2021-01-21 ENCOUNTER — Encounter: Payer: Self-pay | Admitting: Dermatology

## 2021-01-21 DIAGNOSIS — M2669 Other specified disorders of temporomandibular joint: Secondary | ICD-10-CM | POA: Diagnosis not present

## 2021-01-21 DIAGNOSIS — H9202 Otalgia, left ear: Secondary | ICD-10-CM | POA: Diagnosis not present

## 2021-01-21 NOTE — Progress Notes (Signed)
   Follow-Up Visit   Subjective  Peter [name] is a 55 y.o. male who presents for the following: Skin Problem (Patient here today for check of lesion above the right eyebrow x 3 months no bleeding. No persona history or family history of atypical moles, melanoma or non mole skin cancer.).  Skin check, newer spot above right outer eyebrow Location:  Duration:  Quality:  Associated Signs/Symptoms: Modifying Factors:  Severity:  Timing: Context:   Objective  Well appearing patient in no apparent distress; mood and affect are within normal limits. Objective  Right Eyebrow: 6 mm gritty pink flat crust  Objective  Mid Back: Waist up skin examination, no atypical pigmented lesion or nonmelanoma skin cancer.    All skin waist up examined.   Assessment & Plan    AK (actinic keratosis) Right Eyebrow  If freezing fails, return for possible biopsy.  May judge with a fingertip in 3 to 4 weeks.  Destruction of lesion - Right Eyebrow Complexity: simple   Destruction method: cryotherapy   Informed consent: discussed and consent obtained   Timeout:  patient name, date of birth, surgical site, and procedure verified Lesion destroyed using liquid nitrogen: Yes   Cryotherapy cycles:  4 Outcome: patient tolerated procedure well with no complications   Post-procedure details: wound care instructions given    Encounter for screening for malignant neoplasm of skin Mid Back  Annual skin examination, encouraged to self examine twice annually.  Continued ultraviolet protection.      I, Janalyn Harder, MD, have reviewed all documentation for this visit.  The documentation on 01/21/21 for the exam, diagnosis, procedures, and orders are all accurate and complete.

## 2021-02-27 ENCOUNTER — Encounter: Payer: BC Managed Care – PPO | Admitting: Internal Medicine

## 2021-04-04 ENCOUNTER — Encounter: Payer: Self-pay | Admitting: Family Medicine

## 2021-04-04 ENCOUNTER — Ambulatory Visit (INDEPENDENT_AMBULATORY_CARE_PROVIDER_SITE_OTHER): Payer: BC Managed Care – PPO | Admitting: Family Medicine

## 2021-04-04 VITALS — Ht 68.0 in | Wt 164.0 lb

## 2021-04-04 DIAGNOSIS — U071 COVID-19: Secondary | ICD-10-CM

## 2021-04-04 HISTORY — DX: COVID-19: U07.1

## 2021-04-04 MED ORDER — MOLNUPIRAVIR EUA 200MG CAPSULE: 4.0000 | ORAL_CAPSULE | Freq: Two times a day (BID) | ORAL | 0 refills | Status: AC

## 2021-04-04 NOTE — Progress Notes (Signed)
Virtual Visit via telephone Note Due to COVID-19 pandemic this visit was conducted virtually. This visit type was conducted due to national recommendations for restrictions regarding the COVID-19 Pandemic (e.g. social distancing, sheltering in place) in an effort to limit this patient's exposure and mitigate transmission in our community. All issues noted in this document were discussed and addressed.  A physical exam was not performed with this format.   I connected with Peter [name] on 04/04/2021 at 1250 by telephone and verified that I am speaking with the correct person using two identifiers. Peter [name] is currently located at home and family is currently with them during visit. The provider, Kari Baars, FNP is located in their office at time of visit.  I discussed the limitations, risks, security and privacy concerns of performing an evaluation and management service by telephone and the availability of in person appointments. I also discussed with the patient that there may be a patient responsible charge related to this service. The patient expressed understanding and agreed to proceed.  Subjective:  Patient ID: Peter [name], male    DOB: September 09, 1965, 55 y.o.   MRN: [id number]  Chief Complaint:  Covid Positive   HPI: Peter [name] is a 55 y.o. male presenting on 04/04/2021 for Covid Positive   Pt reports wife has had COVID-19 for 4 days. States he started feeling bad last night, took 2 home tests which were both positive. States he has cough, congestion, and rhinorrhea. He has been taking tylenol and claritin without relief of symptoms.     Relevant past medical, surgical, family, and social history reviewed and updated as indicated.  Allergies and medications reviewed and updated.   Past Medical History:  Diagnosis Date   Degenerative disc disease, lumbar    Dr. Shelle Iron   Dyslipidemia    GERD (gastroesophageal reflux disease)    Helicobacter pylori antibody  positive 2006-7   treated with antibiotics    Past Surgical History:  Procedure Laterality Date   COLONOSCOPY  2013   ESOPHAGOGASTRODUODENOSCOPY  2007   Dr. Evette Cristal   NASAL SINUS SURGERY      Social History   Socioeconomic History   Marital status: Married    Spouse name: Not on file   Number of children: 2   Years of education: Not on file   Highest education level: Not on file  Occupational History   Occupation: Real Environmental health practitioner: [employer]  Tobacco Use   Smoking status: Never   Smokeless tobacco: Never  Vaping Use   Vaping Use: Never used  Substance and Sexual Activity   Alcohol use: No   Drug use: No   Sexual activity: Not on file  Other Topics Concern   Not on file  Social History Narrative   Married   Two sons born 1995 and 2000   Work - real estate   Never smoker no alcohol tobacco or drug use   Social Determinants of Corporate investment banker Strain: Not on file  Food Insecurity: Not on file  Transportation Needs: Not on file  Physical Activity: Not on file  Stress: Not on file  Social Connections: Not on file  Intimate Partner Violence: Not on file    Outpatient Encounter Medications as of 04/04/2021  Medication Sig   molnupiravir EUA 200 mg CAPS Take 4 capsules (800 mg total) by mouth 2 (two) times daily for 5 days.   famotidine (PEPCID) 20 MG tablet Take 20  mg by mouth as needed for heartburn or indigestion.   No facility-administered encounter medications on file as of 04/04/2021.    No Known Allergies  Review of Systems       Observations/Objective: No vital signs or physical exam, this was a telephone or virtual health encounter.  Pt alert and oriented, answers all questions appropriately, and able to speak in full sentences.    Assessment and Plan: Peter [name] was seen today for covid positive.  Diagnoses and all orders for this visit:  COVID-19 Positive for COVID-19 today. Onset of symptoms yesterday. Will initiate  molnupiravir as prescribed due to age, heart disease, dyslipidemia, and asthma. Symptomatic care discussed in detail. Pt aware to report any new, worsening, or persistent symptoms.  -     molnupiravir EUA 200 mg CAPS; Take 4 capsules (800 mg total) by mouth 2 (two) times daily for 5 days.     Follow Up Instructions: Return if symptoms worsen or fail to improve.    I discussed the assessment and treatment plan with the patient. The patient was provided an opportunity to ask questions and all were answered. The patient agreed with the plan and demonstrated an understanding of the instructions.   The patient was advised to call back or seek an in-person evaluation if the symptoms worsen or if the condition fails to improve as anticipated.  The above assessment and management plan was discussed with the patient. The patient verbalized understanding of and has agreed to the management plan. Patient is aware to call the clinic if they develop any new symptoms or if symptoms persist or worsen. Patient is aware when to return to the clinic for a follow-up visit. Patient educated on when it is appropriate to go to the emergency department.    I provided 17 minutes of non-face-to-face time during this encounter. The call started at 1250. The call ended at 1307. The other time was used for coordination of care.    Kari Baars, FNP-C [facility] [address] [place], [place] [address] [phone] 04/04/2021

## 2021-05-08 ENCOUNTER — Encounter: Payer: Self-pay | Admitting: Internal Medicine

## 2021-05-08 ENCOUNTER — Ambulatory Visit (AMBULATORY_SURGERY_CENTER): Payer: BC Managed Care – PPO

## 2021-05-08 VITALS — Ht 68.0 in | Wt 168.0 lb

## 2021-05-08 DIAGNOSIS — Z1211 Encounter for screening for malignant neoplasm of colon: Secondary | ICD-10-CM

## 2021-05-08 DIAGNOSIS — Z8371 Family history of colonic polyps: Secondary | ICD-10-CM

## 2021-05-08 NOTE — Progress Notes (Signed)
No egg or soy allergy known to patient  No issues known to pt with past sedation with any surgeries or procedures Patient denies ever being told they had issues or difficulty with intubation  No FH of Malignant Hyperthermia Pt is not on diet pills Pt is not on  home 02  Pt is not on blood thinners  Pt denies issues with constipation  No A fib or A flutter  EMMI video to pt or via MyChart  COVID 19 guidelines implemented in [place] today with Pt and RN    Virtual previsit  Suprep at home  Due to the COVID-19 pandemic we are asking patients to follow certain guidelines.  Pt aware of COVID protocols and [facility] guidelines

## 2021-05-22 ENCOUNTER — Encounter: Payer: Self-pay | Admitting: Internal Medicine

## 2021-05-22 ENCOUNTER — Other Ambulatory Visit: Payer: Self-pay

## 2021-05-22 ENCOUNTER — Ambulatory Visit (AMBULATORY_SURGERY_CENTER): Payer: BC Managed Care – PPO | Admitting: Internal Medicine

## 2021-05-22 VITALS — BP 117/66 | HR 82 | Temp 98.4°F | Resp 15 | Ht 68.0 in | Wt 168.0 lb

## 2021-05-22 DIAGNOSIS — Z8371 Family history of colonic polyps: Secondary | ICD-10-CM

## 2021-05-22 DIAGNOSIS — Z1211 Encounter for screening for malignant neoplasm of colon: Secondary | ICD-10-CM | POA: Diagnosis not present

## 2021-05-22 MED ORDER — SODIUM CHLORIDE 0.9 % IV SOLN
500.0000 mL | Freq: Once | INTRAVENOUS | Status: DC
Start: 2021-05-22 — End: 2021-05-22

## 2021-05-22 NOTE — Progress Notes (Signed)
[facility] History and Physical   Primary Care Physician:  Mechele Claude, MD   Reason for Procedure:   Colon cancer screening  Plan:    colonoscopy     HPI: Peter [name] is a 55 y.o. male here for colon cancer screening   Past Medical History:  Diagnosis Date   COVID 04/04/2021   states mild sx   Degenerative disc disease, lumbar    Dr. Shelle Iron   Dyslipidemia    GERD (gastroesophageal reflux disease)    Helicobacter pylori antibody positive 02/21/2005   treated with antibiotics    Past Surgical History:  Procedure Laterality Date   COLONOSCOPY  2013   normal   ESOPHAGOGASTRODUODENOSCOPY  2007   Dr. Evette Cristal   NASAL SINUS SURGERY  1986    Prior to Admission medications   Medication Sig Start Date End Date Taking? Authorizing Provider  famotidine (PEPCID) 20 MG tablet Take 20 mg by mouth as needed for heartburn or indigestion.   Yes [provider]  ibuprofen (ADVIL) 400 MG tablet Take 400 mg by mouth every 6 (six) hours as needed.   Yes [provider]    Current Outpatient Medications  Medication Sig Dispense Refill   famotidine (PEPCID) 20 MG tablet Take 20 mg by mouth as needed for heartburn or indigestion.     ibuprofen (ADVIL) 400 MG tablet Take 400 mg by mouth every 6 (six) hours as needed.     Current Facility-Administered Medications  Medication Dose Route Frequency Provider Last Rate Last Admin   0.9 %  sodium chloride infusion  500 mL Intravenous Once Iva Boop, MD        Allergies as of 05/22/2021   (No Known Allergies)    Family History  Problem Relation Age of Onset   Colon polyps Mother    Diabetes Father    Heart disease Father 2   Heart disease Brother 58       Died age 53   Heart attack Brother 49       Alive 3 heart attackes at age 58   Prostate cancer Brother    Colon polyps Brother    Colon polyps Brother    Diabetes Brother    Colon cancer Neg Hx    Esophageal cancer Neg Hx    Rectal  cancer Neg Hx    Stomach cancer Neg Hx        Review of Systems:  other review of systems negative except as mentioned in the HPI.  Physical Exam: Vital signs BP 95/64   Pulse 78   Temp 98.4 F (36.9 C) (Temporal)   Ht 5\' 8"  (1.727 m)   Wt 168 lb (76.2 kg)   SpO2 99%   BMI 25.54 kg/m   General:   Alert,  Well-developed, well-nourished, pleasant and cooperative in NAD Lungs:  Clear throughout to auscultation.   Heart:  Regular rate and rhythm; no murmurs, clicks, rubs,  or gallops. Abdomen:  Soft, nontender and nondistended. Normal bowel sounds.   Neuro/Psych:  Alert and cooperative. Normal mood and affect. A and O x 3   @[name]  , MD, [facility] [phone] (pager) 05/22/2021 2:35 PM@

## 2021-05-22 NOTE — Patient Instructions (Addendum)
No polyps or cancer were seen.  You do have diverticulosis - thickened muscle rings and pouches in the colon wall. Please read the handout about this condition.  Next routine colonoscopy or other screening test in 10 years - 2032.  I appreciate the opportunity to care for you. Iva Boop, MD, FACG   YOU HAD AN ENDOSCOPIC PROCEDURE TODAY AT THE  ENDOSCOPY CENTER:   Refer to the procedure report that was given to you for any specific questions about what was found during the examination.  If the procedure report does not answer your questions, please call your gastroenterologist to clarify.  If you requested that your care partner not be given the details of your procedure findings, then the procedure report has been included in a sealed envelope for you to review at your convenience later.   YOU SHOULD EXPECT: Some feelings of bloating in the abdomen. Passage of more gas than usual.  Walking can help get rid of the air that was put into your GI tract during the procedure and reduce the bloating. If you had a lower endoscopy (such as a colonoscopy or flexible sigmoidoscopy) you may notice spotting of blood in your stool or on the toilet paper. If you underwent a bowel prep for your procedure, you may not have a normal bowel movement for a few days.  Please Note:  You might notice some irritation and congestion in your nose or some drainage.  This is from the oxygen used during your procedure.  There is no need for concern and it should clear up in a day or so.  SYMPTOMS TO REPORT IMMEDIATELY:  Following lower endoscopy (colonoscopy or flexible sigmoidoscopy):  Excessive amounts of blood in the stool  Significant tenderness or worsening of abdominal pains  Swelling of the abdomen that is new, acute  Fever of 100F or higher  For urgent or emergent issues, a gastroenterologist can be reached at any hour by calling [phone]. Do not use MyChart messaging for urgent concerns.     DIET:  We do recommend a small meal at first, but then you may proceed to your regular diet.  Drink plenty of fluids but you should avoid alcoholic beverages for 24 hours.  ACTIVITY:  You should plan to take it easy for the rest of today and you should NOT DRIVE or use heavy machinery until tomorrow (because of the sedation medicines used during the test).    FOLLOW UP: Our staff will call the number listed on your records 48-72 hours following your procedure to check on you and address any questions or concerns that you may have regarding the information given to you following your procedure. If we do not reach you, we will leave a message.  We will attempt to reach you two times.  During this call, we will ask if you have developed any symptoms of COVID 19. If you develop any symptoms (ie: fever, flu-like symptoms, shortness of breath, cough etc.) before then, please call [phone].  If you test positive for Covid 19 in the 2 weeks post procedure, please call and report this information to [place].     SIGNATURES/CONFIDENTIALITY: You and/or your care partner have signed paperwork which will be entered into your electronic medical record.  These signatures attest to the fact that that the information above on your After Visit Summary has been reviewed and is understood.  Full responsibility of the confidentiality of this discharge information lies with you and/or your care-partner.

## 2021-05-22 NOTE — Progress Notes (Signed)
VS completed by DT.  Pt's states no medical or surgical changes since previsit or office visit.  

## 2021-05-22 NOTE — Op Note (Signed)
[facility] Patient Name: Peter [name] Procedure Date: 05/22/2021 2:35 PM MRN: [id number] Endoscopist: Iva Boop , MD Age: 55 Referring MD:  Date of Birth: April 15, 1966 Gender: Male Account #: [id number] Procedure:                Colonoscopy Indications:              Screening for colorectal malignant neoplasm Medicines:                Propofol per Anesthesia, Monitored Anesthesia Care Procedure:                Pre-Anesthesia Assessment:                           - Prior to the procedure, a History and Physical                            was performed, and patient medications and                            allergies were reviewed. The patient's tolerance of                            previous anesthesia was also reviewed. The risks                            and benefits of the procedure and the sedation                            options and risks were discussed with the patient.                            All questions were answered, and informed consent                            was obtained. Prior Anticoagulants: The patient has                            taken no previous anticoagulant or antiplatelet                            agents. ASA Grade Assessment: II - A patient with                            mild systemic disease. After reviewing the risks                            and benefits, the patient was deemed in                            satisfactory condition to undergo the procedure.                           After obtaining informed consent, the colonoscope  was passed under direct vision. Throughout the                            procedure, the patient's blood pressure, pulse, and                            oxygen saturations were monitored continuously. The                            Olympus CF-HQ190L ([id number]) Colonoscope was                            introduced through the anus and advanced to the the                             cecum, identified by appendiceal orifice and                            ileocecal valve. The colonoscopy was performed                            without difficulty. The patient tolerated the                            procedure well. The quality of the bowel                            preparation was good. The ileocecal valve,                            appendiceal orifice, and rectum were photographed.                            The bowel preparation used was SUPREP via split                            dose instruction. Scope In: 2:44:00 PM Scope Out: 2:56:00 PM Scope Withdrawal Time: 0 hours 9 minutes 22 seconds  Total Procedure Duration: 0 hours 12 minutes 0 seconds  Findings:                 The perianal and digital rectal examinations were                            normal. Pertinent negatives include normal prostate                            (size, shape, and consistency).                           Multiple small and large-mouthed diverticula were                            found in the sigmoid colon and descending colon.  The exam was otherwise without abnormality on                            direct and retroflexion views. Complications:            No immediate complications. Estimated Blood Loss:     Estimated blood loss: none. Impression:               - Moderate diverticulosis in the sigmoid colon and                            in the descending colon.                           - The examination was otherwise normal on direct                            and retroflexion views.                           - No specimens collected. Recommendation:           - Patient has a contact number available for                            emergencies. The signs and symptoms of potential                            delayed complications were discussed with the                            patient. Return to normal activities tomorrow.                             Written discharge instructions were provided to the                            patient.                           - Resume previous diet.                           - Continue present medications.                           - Repeat colonoscopy in 10 years for screening                            purposes. Iva Boop, MD 05/22/2021 3:04:09 PM This report has been signed electronically.

## 2021-05-22 NOTE — Progress Notes (Signed)
Vss nad transferred to pacu 

## 2021-05-26 ENCOUNTER — Telehealth: Payer: Self-pay

## 2021-05-26 NOTE — Telephone Encounter (Signed)
  Follow up Call-  Call back number 05/22/2021  Post procedure Call Back phone  # [phone]  Permission to leave phone message Yes  Some recent data might be hidden     Left message

## 2021-06-04 DIAGNOSIS — M25512 Pain in left shoulder: Secondary | ICD-10-CM | POA: Diagnosis not present

## 2021-07-01 ENCOUNTER — Ambulatory Visit: Payer: BC Managed Care – PPO | Admitting: Dermatology

## 2022-05-29 DIAGNOSIS — N400 Enlarged prostate without lower urinary tract symptoms: Secondary | ICD-10-CM | POA: Diagnosis not present

## 2022-06-05 DIAGNOSIS — N400 Enlarged prostate without lower urinary tract symptoms: Secondary | ICD-10-CM | POA: Diagnosis not present

## 2022-11-18 DIAGNOSIS — D229 Melanocytic nevi, unspecified: Secondary | ICD-10-CM | POA: Diagnosis not present

## 2022-11-18 DIAGNOSIS — Z1283 Encounter for screening for malignant neoplasm of skin: Secondary | ICD-10-CM | POA: Diagnosis not present

## 2023-01-28 ENCOUNTER — Other Ambulatory Visit: Payer: Self-pay | Admitting: Physician Assistant

## 2023-01-28 DIAGNOSIS — R221 Localized swelling, mass and lump, neck: Secondary | ICD-10-CM

## 2023-02-03 ENCOUNTER — Ambulatory Visit
Admission: RE | Admit: 2023-02-03 | Discharge: 2023-02-03 | Disposition: A | Discharge status: Home / Self Care | Payer: BC Managed Care – PPO | Source: Ambulatory Visit | Attending: Physician Assistant | Admitting: Physician Assistant

## 2023-02-03 DIAGNOSIS — R221 Localized swelling, mass and lump, neck: Secondary | ICD-10-CM

## 2023-02-03 DIAGNOSIS — I889 Nonspecific lymphadenitis, unspecified: Secondary | ICD-10-CM | POA: Diagnosis not present

## 2023-02-03 MED ORDER — IOPAMIDOL (ISOVUE-300) INJECTION 61%
75.0000 mL | Freq: Once | INTRAVENOUS | Status: AC | PRN
  Administered 2023-02-03: 75 mL via INTRAVENOUS

## 2023-02-18 DIAGNOSIS — R221 Localized swelling, mass and lump, neck: Secondary | ICD-10-CM | POA: Diagnosis not present

## 2023-02-24 ENCOUNTER — Telehealth: Payer: Self-pay | Admitting: General Practice

## 2023-02-24 NOTE — Telephone Encounter (Signed)
  Good Morning, you have pt Peter [name] and her husband Gavin Pound stated you agreed to take him on as a new pt. I was just checking with you to see if this was okay.

## 2023-02-24 NOTE — Telephone Encounter (Signed)
Okay.  Thanks.

## 2023-02-24 NOTE — Telephone Encounter (Signed)
Yes - I will accept.  Pls schedule for an 11 am slot

## 2023-03-01 ENCOUNTER — Other Ambulatory Visit (HOSPITAL_COMMUNITY): Payer: Self-pay | Admitting: Physician Assistant

## 2023-03-01 DIAGNOSIS — R221 Localized swelling, mass and lump, neck: Secondary | ICD-10-CM

## 2023-03-01 NOTE — Progress Notes (Signed)
Oley Balm, MD  Leodis Rains D PROCEDURE / BIOPSY REVIEW Date: 03/01/23  Requested Biopsy site: L cerv LAN Reason for request: r/o met Imaging review: Best seen on CT 02/03/23  Decision: Approved Imaging modality to perform: Ultrasound Schedule with: Patient preference (Local vs Mod Sed) Schedule for: Any VIR  Additional comments:    Please contact me with questions, concerns, or if issue pertaining to this request arise.  Dayne Oley Balm, MD [facility] Radiology

## 2023-03-10 NOTE — Progress Notes (Signed)
Patient for US guided Core LN Biopsy on Thurs 03/11/2023, I called and spoke with the patient's wife, Consuella Lose on the phone and gave pre-procedure instructions. Consuella Lose was made aware to have the patient here at 1p and check in at the [facility]. Consuella Lose stated understanding.  Called 03/10/2023

## 2023-03-11 ENCOUNTER — Ambulatory Visit
Admission: RE | Admit: 2023-03-11 | Discharge: 2023-03-11 | Disposition: A | Discharge status: Home / Self Care | Payer: BC Managed Care – PPO | Source: Ambulatory Visit | Attending: Physician Assistant | Admitting: Physician Assistant

## 2023-03-11 DIAGNOSIS — C770 Secondary and unspecified malignant neoplasm of lymph nodes of head, face and neck: Secondary | ICD-10-CM | POA: Diagnosis not present

## 2023-03-11 DIAGNOSIS — R599 Enlarged lymph nodes, unspecified: Secondary | ICD-10-CM | POA: Insufficient documentation

## 2023-03-11 DIAGNOSIS — C801 Malignant (primary) neoplasm, unspecified: Secondary | ICD-10-CM | POA: Diagnosis not present

## 2023-03-11 DIAGNOSIS — R221 Localized swelling, mass and lump, neck: Secondary | ICD-10-CM

## 2023-03-11 MED ORDER — LIDOCAINE HCL (PF) 1 % IJ SOLN
7.0000 mL | Freq: Once | INTRAMUSCULAR | Status: AC
  Administered 2023-03-11: 7 mL via SUBCUTANEOUS
  Filled 2023-03-11: qty 8

## 2023-03-11 NOTE — Discharge Instructions (Signed)
Instructions discussed with patient. PCS 

## 2023-03-15 ENCOUNTER — Other Ambulatory Visit (HOSPITAL_COMMUNITY): Payer: Self-pay | Admitting: Physician Assistant

## 2023-03-15 DIAGNOSIS — C779 Secondary and unspecified malignant neoplasm of lymph node, unspecified: Secondary | ICD-10-CM

## 2023-03-18 ENCOUNTER — Encounter (HOSPITAL_COMMUNITY): Payer: Self-pay

## 2023-03-18 ENCOUNTER — Encounter (HOSPITAL_COMMUNITY)
Admission: RE | Admit: 2023-03-18 | Discharge: 2023-03-18 | Disposition: A | Discharge status: Home / Self Care | Payer: BC Managed Care – PPO | Source: Ambulatory Visit | Attending: Physician Assistant | Admitting: Physician Assistant

## 2023-03-18 ENCOUNTER — Ambulatory Visit (HOSPITAL_COMMUNITY): Payer: BC Managed Care – PPO

## 2023-03-18 DIAGNOSIS — C779 Secondary and unspecified malignant neoplasm of lymph node, unspecified: Secondary | ICD-10-CM | POA: Diagnosis not present

## 2023-03-18 DIAGNOSIS — C4442 Squamous cell carcinoma of skin of scalp and neck: Secondary | ICD-10-CM | POA: Diagnosis not present

## 2023-03-18 MED ORDER — FLUDEOXYGLUCOSE F - 18 (FDG) INJECTION
8.1600 | Freq: Once | INTRAVENOUS | Status: AC | PRN
  Administered 2023-03-18: 8.16 via INTRAVENOUS

## 2023-03-23 ENCOUNTER — Encounter: Payer: Self-pay | Admitting: Internal Medicine

## 2023-03-23 DIAGNOSIS — C801 Malignant (primary) neoplasm, unspecified: Secondary | ICD-10-CM | POA: Insufficient documentation

## 2023-03-23 NOTE — Progress Notes (Unsigned)
      Subjective:    Patient ID: Peter [name], male    DOB: [birth year], 57 y.o.   MRN: [id number]     HPI Peter [name] is here for follow up of his chronic medical problems. He is here to establish with a new pcp.         Medications and allergies reviewed with patient and updated if appropriate.  Current Outpatient Medications on File Prior to Visit  Medication Sig Dispense Refill   famotidine (PEPCID) 20 MG tablet Take 20 mg by mouth as needed for heartburn or indigestion.     ibuprofen (ADVIL) 400 MG tablet Take 400 mg by mouth every 6 (six) hours as needed.     No current facility-administered medications on file prior to visit.     Review of Systems     Objective:  There were no vitals filed for this visit. BP Readings from Last 3 Encounters:  03/11/23 119/83  05/22/21 117/66  01/14/21 125/82   Wt Readings from Last 3 Encounters:  05/22/21 168 lb (76.2 kg)  05/08/21 168 lb (76.2 kg)  04/04/21 164 lb (74.4 kg)   There is no height or weight on file to calculate BMI.    Physical Exam     Lab Results  Component Value Date   WBC 7.5 05/13/2016   HGB 14.6 05/13/2016   HCT 43.8 05/13/2016   PLT 203.0 05/13/2016   GLUCOSE 84 05/25/2019   CHOL 112 05/25/2019   TRIG 187 (H) 05/25/2019   HDL 33 (L) 05/25/2019   LDLCALC 48 05/25/2019   ALT 23 05/25/2019   AST 17 05/25/2019   NA 141 05/25/2019   K 3.7 05/25/2019   CL 102 05/25/2019   CREATININE 0.99 05/25/2019   BUN 13 05/25/2019   CO2 27 05/25/2019   HGBA1C 5.6 02/27/2016     Assessment & Plan:    See Problem List for Assessment and Plan of chronic medical problems.

## 2023-03-23 NOTE — Patient Instructions (Incomplete)
      Blood work was ordered.   The lab is on the first floor.    Medications changes include :       A referral was ordered and someone will call you to schedule an appointment.     No follow-ups on file.  

## 2023-03-24 ENCOUNTER — Encounter: Payer: Self-pay | Admitting: Internal Medicine

## 2023-03-24 ENCOUNTER — Ambulatory Visit (INDEPENDENT_AMBULATORY_CARE_PROVIDER_SITE_OTHER): Payer: BC Managed Care – PPO | Admitting: Internal Medicine

## 2023-03-24 ENCOUNTER — Telehealth: Payer: Self-pay | Admitting: Radiation Oncology

## 2023-03-24 ENCOUNTER — Encounter: Payer: Self-pay | Admitting: *Deleted

## 2023-03-24 VITALS — BP 118/76 | HR 80 | Temp 98.6°F | Ht 68.0 in | Wt 165.0 lb

## 2023-03-24 DIAGNOSIS — R739 Hyperglycemia, unspecified: Secondary | ICD-10-CM | POA: Diagnosis not present

## 2023-03-24 DIAGNOSIS — Z0000 Encounter for general adult medical examination without abnormal findings: Secondary | ICD-10-CM

## 2023-03-24 DIAGNOSIS — C779 Secondary and unspecified malignant neoplasm of lymph node, unspecified: Secondary | ICD-10-CM

## 2023-03-24 DIAGNOSIS — K219 Gastro-esophageal reflux disease without esophagitis: Secondary | ICD-10-CM | POA: Diagnosis not present

## 2023-03-24 DIAGNOSIS — E785 Hyperlipidemia, unspecified: Secondary | ICD-10-CM | POA: Diagnosis not present

## 2023-03-24 DIAGNOSIS — R931 Abnormal findings on diagnostic imaging of heart and coronary circulation: Secondary | ICD-10-CM | POA: Diagnosis not present

## 2023-03-24 DIAGNOSIS — C801 Malignant (primary) neoplasm, unspecified: Secondary | ICD-10-CM

## 2023-03-24 NOTE — Progress Notes (Signed)
Received referral to Medical Oncology and Radiation Oncology.  Discussed patient with Dr. Basilio Cairo.  Patient referred to [facility] ENT/H&N Surgery to evaluate for possible TORS.  I called patient to inform him that we have received his referrals and that Dr. Basilio Cairo requested the referral to [facility].  I informed him of his appointment on 03/26/23 at 2:30 PM at [facility] and explained the purpose for the consult.  Patient agreeable with referral to [facility] and the upcoming appointment.  I also informed him that we are in the process of scheduling his appointments with MedOnc and RadOnc.  I gave him my contact information and for [facility], H&N Navigator.  Patient denied any questions.  I encouraged him to call if he has any questions or needs.  I also gave him the phone number for [facility] if needed to call about upcoming appointment.

## 2023-03-24 NOTE — Assessment & Plan Note (Signed)
LDL at goal without medication Encouraged regular exercise which she is not doing Blood pressure well-controlled Plans on seeing Dr. Antoine Poche again Will check lipid panel Discussed possible repeating coronary artery calcium score CT and getting an LPA

## 2023-03-24 NOTE — Telephone Encounter (Signed)
Left message for patient to call back to schedule consult per 7/30 referral.

## 2023-03-24 NOTE — Assessment & Plan Note (Signed)
Noticed knot in left neck and may 2024-saw ENT-head CT scan, biopsy which showed squamous cell carcinoma PET scan 03/18/2023-no primary or other metastatic disease noted Has been referred to oncology

## 2023-03-24 NOTE — Assessment & Plan Note (Signed)
Chronic Low HDL LDL at goal and well-controlled Elevated CAC of 46 in 2017 Stressed regular exercise Recheck lipid panel, CMP today

## 2023-03-24 NOTE — Assessment & Plan Note (Signed)
Chronic GERD controlled Continue pepcid 20 mg prn

## 2023-03-24 NOTE — Assessment & Plan Note (Signed)
Chronic Check a1c, lipid panel, TSH, CMP, CBC Low sugar / carb diet Stressed regular exercise

## 2023-03-25 ENCOUNTER — Telehealth: Payer: Self-pay | Admitting: Radiation Oncology

## 2023-03-25 LAB — COMPREHENSIVE METABOLIC PANEL
ALT: 22 U/L (ref 0–53)
AST: 22 U/L (ref 0–37)
Albumin: 4.2 g/dL (ref 3.5–5.2)
Alkaline Phosphatase: 68 U/L (ref 39–117)
BUN: 14 mg/dL (ref 6–23)
CO2: 29 mEq/L (ref 19–32)
Calcium: 9.2 mg/dL (ref 8.4–10.5)
Chloride: 102 mEq/L (ref 96–112)
Creatinine, Ser: 0.99 mg/dL (ref 0.40–1.50)
GFR: 84.82 mL/min (ref >60.00)
Glucose, Bld: 91 mg/dL (ref 70–99)
Potassium: 3.7 mEq/L (ref 3.5–5.1)
Sodium: 137 mEq/L (ref 135–145)
Total Bilirubin: 0.5 mg/dL (ref 0.2–1.2)
Total Protein: 7.5 g/dL (ref 6.0–8.3)

## 2023-03-25 LAB — CBC WITH DIFFERENTIAL/PLATELET
Basophils Absolute: 0 10*3/uL (ref 0.0–0.1)
Basophils Relative: 0.4 % (ref 0.0–3.0)
Eosinophils Absolute: 0.1 10*3/uL (ref 0.0–0.7)
Eosinophils Relative: 1.2 % (ref 0.0–5.0)
HCT: 46 % (ref 39.0–52.0)
Hemoglobin: 15 g/dL (ref 13.0–17.0)
Lymphocytes Relative: 28.9 % (ref 12.0–46.0)
Lymphs Abs: 2 10*3/uL (ref 0.7–4.0)
MCHC: 32.7 g/dL (ref 30.0–36.0)
MCV: 95.2 fl (ref 78.0–100.0)
Monocytes Absolute: 0.8 10*3/uL (ref 0.1–1.0)
Monocytes Relative: 10.9 % (ref 3.0–12.0)
Neutro Abs: 4.1 10*3/uL (ref 1.4–7.7)
Neutrophils Relative %: 58.6 % (ref 43.0–77.0)
Platelets: 221 10*3/uL (ref 150.0–400.0)
RBC: 4.84 Mil/uL (ref 4.22–5.81)
RDW: 13.2 % (ref 11.5–15.5)
WBC: 7 10*3/uL (ref 4.0–10.5)

## 2023-03-25 LAB — HEMOGLOBIN A1C: Hgb A1c MFr Bld: 6 % (ref 4.6–6.5)

## 2023-03-25 LAB — LIPID PANEL
Cholesterol: 139 mg/dL (ref 0–200)
HDL: 38.3 mg/dL — ABNORMAL LOW (ref >39.00)
LDL Cholesterol: 69 mg/dL (ref 0–99)
NonHDL: 100.45
Total CHOL/HDL Ratio: 4
Triglycerides: 156 mg/dL — ABNORMAL HIGH (ref 0.0–149.0)
VLDL: 31.2 mg/dL (ref 0.0–40.0)

## 2023-03-25 LAB — TSH: TSH: 2.87 u[IU]/mL (ref 0.35–5.50)

## 2023-03-25 NOTE — Telephone Encounter (Signed)
Left message for patient to call back to schedule consult per 7/30 referral.

## 2023-03-25 NOTE — Progress Notes (Signed)
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 3.1 cm cystic/necrotic left level IIA lymph node highly suspicious for metastatic disease. Recommend ENT referral and sampling. 2. No definite primary malignancy is identified in the neck.  03-11-23  IMPRESSION: Technically successful ultrasound-guided left cervical lymph node biopsy.   Peter Coots, MD  NM PET Image Initial (PI) Skull Base To Thigh 03/18/2023  IMPRESSION: 1. Hypermetabolic left level IIA cervical lymph node consistent with metastatic squamous cell carcinoma. 2. No primary head and neck malignancy identified. Symmetric hypermetabolic activity in both tonsillar regions is likely physiologic or inflammatory. 3. No evidence of metastatic disease in the chest, abdomen or pelvis.   Patient presented with symptoms of: Patient states he has a lump on left side of neck area x 1 week,  Biopsies revealed:    Nutrition Status Yes No Comments  Weight changes? []  [x]  Weight has been stable  Swallowing concerns? []  [x]  No concerns with swallowing prior to procedure on 04-05-23 (pain with tonsillectomy)  PEG? []  [x]     Referrals Yes No Comments  Social Work? []  [x]    Dentistry? [x]  []  Saw at the end of May,  everything looked good, plan to start on fluoride treatment  Swallowing therapy? [x]  []    Nutrition? [x]  []    Med/Onc? []  [x]     Safety Issues Yes No Comments  Prior radiation? []  [x]    Pacemaker/ICD? []  [x]    Possible current pregnancy? []  [x]    Is the patient on methotrexate? []  [x]     Tobacco/Marijuana/Snuff/ETOH use: non-smoker  Past/Anticipated interventions by otolaryngology, if any:  Peter Ingegerd Jodelle Red, PA-C - 01/28/2023  HPI:   Chief Complaint  Patient presents with  Otitis Media  Patient states he has a lump on left side of neck area x 1  week, no other sx noted.   Peter [name] is pleasant 57 y.o. male who presents as an established patient for left neck mass. This is a new problem. Course is stable. He noticed it one week ago. It does not bother him in any way. No recent illness, sick exporsures or trauma. States he saw his dentist about three weeks ago for routine cleaning and nothing was mentioned to him after palpation of the neck. No abnormal swelling or masses noted elsewhere. No fluctuance associated with eating/drinking.   Denies fever, chills, night sweats, unintentional weight loss, sore throat, dysphagia, odynophagia, cough, hemoptysis, hoarseness, difficulty breathing, shortness of breath, otalgia, tinnitus, changes in hearing or cervicalgia. No facial weakness or numbness/tingling.   No recent head/neck imaging.   Patient last seen for left sided otalgia in May of 2022. History and exam was consistent with referred pain from the jaw; patient states he eliminated chewing gum and the problem resolved.   Impression & Plans:  Peter [name] is a 57 y.o. male with one week history of left level II 1cm well-circumscribed neck mass. Patient otherwise is asymptomatic. Differential diagnosis includes parotid tail mass, branchial cleft cyst, reactive lymphadenopathy versus malignant neoplasia. Recommend additional work up with CT neck with contrast. Patient is agreeable and will follow up accordingly with ENT.   Laryngoscopy Flexible Diagnostic  Date/Time: 02/18/2023 8:58 PM  Performed by: Peter Hines, MD Authorized by: Peter Hines, MD Consent: Consent obtained: Verbal Consent given by: Patient Risks discussed: Bleeding and pain Procedure details: Indications: direct visualization of the upper aerodigestive  tract Meds administered: Afrin with lidocaine. Instrument: flexible fiberoptic laryngoscope Scope location: left nare Nasal cavity: Left inferior turbinates: normal Sinus: Left  nasopharynx: normal Mouth: Oropharynx: normal Vallecula: normal Base of tongue: normal Epiglottis: normal Throat: Pyriform sinus: normal False vocal cords: normal True vocal cords: normal Post-procedure details: Patient tolerance of procedure: Tolerated well, no immediate complications   Electronically signed by Peter Hines, MD at 02/18/2023 8:59 PM EDT   Procedure NamePriorityDate/TimeAssociated DiagnosisCommentsLARYNGOSCOPY FLEXIBLE DIAGNOSTICRoutine06/27/2024 8:58 PM EDT Neck mass  Results for this procedure are in the results section.  Peter [name] FINE NEEDLE ASPIRATION W/O IMAGE GUIDANCERoutine06/27/2024 8:57 PM EDT Neck mass  Results for this procedure are in the results section.  FINE NEEDLE ASPIRATERoutine06/27/2024 3:33 PM EDT Neck mass  Results for this procedure are in the results section.   Past/Anticipated interventions by medical oncology, if any: none in EMR at this time  Current Complaints / other details: Wife reports he felt lump under chin at home after visit to dentist in May. Pt had biopsy (multiple areas) and tonsillectomy on 04-05-23 at [facility]. His wife stated ENT told them the results would be in, in about 5 days. Pt wife had no major concerns or questions at this time.

## 2023-03-26 DIAGNOSIS — C779 Secondary and unspecified malignant neoplasm of lymph node, unspecified: Secondary | ICD-10-CM | POA: Diagnosis not present

## 2023-03-26 DIAGNOSIS — C801 Malignant (primary) neoplasm, unspecified: Secondary | ICD-10-CM | POA: Diagnosis not present

## 2023-03-30 NOTE — Progress Notes (Signed)
Oncology Nurse Navigator Documentation   Placed introductory call to new referral patient Peter [name]. I spoke with his wife, Peter [name]. Introduced myself as the H&N oncology nurse navigator that works with Dr. Basilio Cairo and Dr. Truett Perna to whom he has been referred by Dr. Jenne Pane. She confirmed understanding of referral. Briefly explained my role as his navigator, provided my contact information.  Confirmed understanding of upcoming appts and [facility] location, explained arrival and registration process. I explained the purpose of a dental evaluation prior to starting RT, indicated he would be contacted by [facility] to arrange an appt. He has been evaluated by his primary dentist and no extractions will be needed prior to radiation. He will be referred for SPDs.  I encouraged them to call with questions/concerns as he moves forward with appts and procedures.   She verbalized understanding of information provided, expressed appreciation for my call.   Navigator Initial Assessment Employment Status: he is working Currently on [employer] / STD: no Living Situation: he lives with his wife Support System: wife, family PCP: Cheryll Cockayne MD PCD: unknown Financial Concerns: not at this time Transportation Needs: no Sensory Deficits: no Chiropodist Needed:  no Ambulation Needs: no Psychosocial Needs:  no Concerns/Needs Understanding Cancer:  addressed/answered by navigator to best of ability Self-Expressed Needs: no   Tourist information centre manager, BSN, OCN Head & Neck Oncology Nurse Navigator [facility] Phone # [phone]  Fax # [phone]

## 2023-04-05 ENCOUNTER — Telehealth: Payer: Self-pay

## 2023-04-05 ENCOUNTER — Encounter: Payer: Self-pay | Admitting: Radiation Oncology

## 2023-04-05 DIAGNOSIS — R490 Dysphonia: Secondary | ICD-10-CM | POA: Diagnosis not present

## 2023-04-05 DIAGNOSIS — C801 Malignant (primary) neoplasm, unspecified: Secondary | ICD-10-CM | POA: Diagnosis not present

## 2023-04-05 DIAGNOSIS — C779 Secondary and unspecified malignant neoplasm of lymph node, unspecified: Secondary | ICD-10-CM | POA: Diagnosis not present

## 2023-04-05 DIAGNOSIS — J351 Hypertrophy of tonsils: Secondary | ICD-10-CM | POA: Diagnosis not present

## 2023-04-05 DIAGNOSIS — J392 Other diseases of pharynx: Secondary | ICD-10-CM | POA: Diagnosis not present

## 2023-04-05 NOTE — Progress Notes (Signed)
Radiation Oncology         [phone] ________________________________  Initial Outpatient Consultation  Name: Peter [name] MRN: [id number]  Date: 04/06/2023  DOB: 01/06/1966  VF:IEPPI, Peter Mo, MD  Christia Reading, MD   REFERRING PHYSICIAN: Christia Reading, MD  DIAGNOSIS: No diagnosis found.   Cancer Staging  No matching staging information was found for the patient.  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 male who presented to Dr. Clovis Cao at [facility] on 01/28/23 with a left sided neck lump x 1 week. He denied any associated symptoms. Physical exam performed during this visit revealed a 1 cm firm left level II somewhat mobile mass. No other palpable masses or enlarged lymph nodes were noted in the neck. (Of note: the patient was already established with Dr. Clovis Cao for a history of TMJ (endorsed left sided ear pain and left sided jaw pain upon presentation in May 2022). His TMJ pain stopped after he stopped chewing gum on a regular basis).   For further evaluation, a soft tissue neck CT with contrast was performed on 02/03/23 and demonstrated a 3.1 cm cystic/necrotic left level IIA lymph nodes suspicious for metastatic disease. No definite primary malignancy was identified in the neck.   FNA of the left neck mass collected on 02/18/23 by Dr. Jenne Pane showed findings consistent with atypical dyskeratotic squamous cells suspicious for malignancy; p16 negative. Given the patient's age and presence of dyskeratotic cells, the features were noted as suspicious for squamous cell carcinoma. Of note: clear yellow fluid was aspirated from the left neck mass during FNA.   Since his FNA specimen did not definitely confirm malignancy, his insurance  carrier would not approve a PET scan. Dr. Clovis Cao subsequently ordered a core needle biopsy to obtain more tissue to submit for pathology : core biopsy of the left cervical lymph node / mass collected on 03/11/23 showed findings consistent with metastatic squamous cell carcinoma; p16 positive.    PET scan on 03/18/23 demonstrated hypermetabolism associated with the left level IIA cervical lymph node, consistent with metastatic squamous cell carcinoma. PET otherwise showed no primary head and neck malignancy identified, and no evidence of metastatic disease in the chest, abdomen, or pelvis. Symmetric hypermetabolic activity was appreciated in both tonsillar regions, however this is likely physiologic or inflammatory in etiology.   Accordingly, the patient was referred to Dr. Lou Cal, [facility] surgical ENT, on 03/26/23 for further management. Laryngoscopy performed during this visit was unremarkable.   Dr. Lou Cal ultimately recommended obtaining a DL biopsy under anesthesia to help assess for a primary. His procedure was performed yesterday. Results are pending at this time.   Swallowing issues, if any: none  Weight Changes: none  Pain status: none  Other symptoms: none  Tobacco history, if any: never smoker   ETOH abuse, if any: does not currently consume alcohol   Prior cancers, if any: none  PREVIOUS RADIATION THERAPY: No  PAST MEDICAL HISTORY:  has a past medical history of COVID (04/04/2021), Degenerative disc disease, lumbar, Dyslipidemia, GERD (gastroesophageal reflux disease), and Helicobacter pylori antibody positive (02/21/2005).    PAST SURGICAL HISTORY: Past Surgical History:  Procedure Laterality Date   COLONOSCOPY  2013   normal   ESOPHAGOGASTRODUODENOSCOPY  2007   Dr. Evette Cristal   NASAL  SINUS SURGERY  1986    FAMILY HISTORY: family history includes Colon polyps in his brother, brother, and mother; Diabetes in his brother and father; Heart attack (age of onset: 35) in  his brother; Heart disease in his brother; Heart disease (age of onset: 70) in his brother; Heart disease (age of onset: 37) in his father; Prostate cancer in his brother.  SOCIAL HISTORY:  reports that he has never smoked. He has never used smokeless tobacco. He reports that he does not currently use alcohol. He reports that he does not use drugs.  ALLERGIES: Patient has no known allergies.  MEDICATIONS:  Current Outpatient Medications  Medication Sig Dispense Refill   famotidine (PEPCID) 20 MG tablet Take 20 mg by mouth as needed for heartburn or indigestion.     ibuprofen (ADVIL) 400 MG tablet Take 400 mg by mouth every 6 (six) hours as needed.     oxyCODONE (OXY IR/ROXICODONE) 5 MG immediate release tablet Take 5 mg by mouth 4 (four) times daily as needed.     prednisoLONE (PRELONE) 15 MG/5ML SOLN Take 6.75 mg by mouth daily. For surgery recovery     No current facility-administered medications for this encounter.    REVIEW OF SYSTEMS:  Notable for that above.   PHYSICAL EXAM:  vitals were not taken for this visit.   General: Alert and oriented, in no acute distress HEENT: Head is normocephalic. Extraocular movements are intact. Oropharynx is notable for ***. Neck: Neck is notable for *** Heart: Regular in rate and rhythm with no murmurs, rubs, or gallops. Chest: Clear to auscultation bilaterally, with no rhonchi, wheezes, or rales. Abdomen: Soft, nontender, nondistended, with no rigidity or guarding. Extremities: No cyanosis or edema. Lymphatics: see Neck Exam Skin: No concerning lesions. Musculoskeletal: symmetric strength and muscle tone throughout. Neurologic: Cranial nerves II through XII are grossly intact. No obvious focalities. Speech is fluent. Coordination is intact. Psychiatric: Judgment and insight are intact. Affect is appropriate.   ECOG = ***  0 - Asymptomatic (Fully active, able to carry on all predisease activities without restriction)  1 - Symptomatic but  completely ambulatory (Restricted in physically strenuous activity but ambulatory and able to carry out work of a light or sedentary nature. For example, light housework, office work)  2 - Symptomatic, <50% in bed during the day (Ambulatory and capable of all self care but unable to carry out any work activities. Up and about more than 50% of waking hours)  3 - Symptomatic, >50% in bed, but not bedbound (Capable of only limited self-care, confined to bed or chair 50% or more of waking hours)  4 - Bedbound (Completely disabled. Cannot carry on any self-care. Totally confined to bed or chair)  5 - Death   Santiago Glad MM, Creech RH, Tormey DC, et al. [phone]). "Toxicity and response criteria of the [facility] Group". Am. Evlyn Clines. Oncol. 5 (6): 649-55   LABORATORY DATA:  Lab Results  Component Value Date   WBC 7.0 03/25/2023   HGB 15.0 03/25/2023   HCT 46.0 03/25/2023   MCV 95.2 03/25/2023   PLT 221.0 03/25/2023   CMP     Component Value Date/Time   NA 137 03/25/2023 0853   NA 141 05/25/2019 0829   K 3.7 03/25/2023 0853   CL 102 03/25/2023 0853   CO2 29 03/25/2023 0853   GLUCOSE 91 03/25/2023 0853   BUN 14 03/25/2023 0853   BUN 13 05/25/2019 0829   CREATININE 0.99 03/25/2023 0853  CALCIUM 9.2 03/25/2023 0853   PROT 7.5 03/25/2023 0853   PROT 6.8 05/25/2019 0832   ALBUMIN 4.2 03/25/2023 0853   ALBUMIN 4.1 05/25/2019 0832   AST 22 03/25/2023 0853   ALT 22 03/25/2023 0853   ALKPHOS 68 03/25/2023 0853   BILITOT 0.5 03/25/2023 0853   BILITOT 0.4 05/25/2019 0832   GFRNONAA 87 05/25/2019 0829   GFRAA 100 05/25/2019 0829      Lab Results  Component Value Date   TSH 2.87 03/25/2023     RADIOGRAPHY: NM PET Image Initial (PI) Skull Base To Thigh (F-18 FDG)  Result Date: 03/22/2023 CLINICAL DATA:  Initial treatment strategy for squamous cell carcinoma of the head and neck. Recent core biopsy of necrotic left cervical lymph node. EXAM: NUCLEAR MEDICINE PET SKULL BASE  TO THIGH TECHNIQUE: 8.16 mCi F-18 FDG was injected intravenously. Full-ring PET imaging was performed from the skull base to thigh after the radiotracer. CT data was obtained and used for attenuation correction and anatomic localization. Fasting blood glucose: 97 mg/dl COMPARISON:  CT neck 30/86/5784 FINDINGS: Mediastinal blood pool activity: SUV max 2.1 NECK: The previously demonstrated level IIA left cervical node measures 2.3 x 1.9 cm on image 41/202 and is hypermetabolic with an SUV max of 5.5. No other hypermetabolic cervical lymph nodes are identified.There is symmetric hypermetabolic activity in both tonsillar regions (SUV max 6.4 on the right and 6.1 on the left), likely physiologic or inflammatory. No asymmetric or otherwise suspicious activity identified within the pharyngeal mucosal space. The nasopharynx appears unremarkable. Incidental CT findings: none CHEST: There are no hypermetabolic mediastinal, hilar or axillary lymph nodes. No hypermetabolic pulmonary activity or suspicious nodularity. Incidental CT findings: Mild coronary artery atherosclerosis. ABDOMEN/PELVIS: There is no hypermetabolic activity within the liver, adrenal glands, spleen or pancreas. There is no hypermetabolic nodal activity in the abdomen or pelvis. Incidental CT findings: Minimal aortic atherosclerosis. Mildly prominent stool throughout the colon. SKELETON: There is no hypermetabolic activity to suggest osseous metastatic disease. Incidental CT findings: Mild lower lumbar spondylosis. IMPRESSION: 1. Hypermetabolic left level IIA cervical lymph node consistent with metastatic squamous cell carcinoma. 2. No primary head and neck malignancy identified. Symmetric hypermetabolic activity in both tonsillar regions is likely physiologic or inflammatory. 3. No evidence of metastatic disease in the chest, abdomen or pelvis. Electronically Signed   By: Carey Bullocks M.D.   On: 03/22/2023 13:52   [place] CORE BIOPSY (LYMPH NODES)  Result  Date: 03/11/2023 INDICATION: 57 year old male with indeterminate left neck mass. EXAM: Ultrasound-guided left cervical lymph node biopsy. MEDICATIONS: None. ANESTHESIA/SEDATION: None. FLUOROSCOPY TIME:  None. COMPLICATIONS: None immediate. PROCEDURE: Informed written consent was obtained from the patient after a thorough discussion of the procedural risks, benefits and alternatives. All questions were addressed. Maximal Sterile Barrier Technique was utilized including caps, mask, sterile gowns, sterile gloves, sterile drape, hand hygiene and skin antiseptic. A timeout was performed prior to the initiation of the procedure. Preprocedure ultrasound evaluation demonstrated a heterogeneously hypoechoic mass in the left inframandibular region measuring up to 2.9 cm. The procedure was planned. Subdermal Local anesthesia was provided 1% lidocaine. Deeper local anesthetic was administered to the periphery of the lymph node under direct ultrasound visualization. A small skin nick was made. Under direct ultrasound visualization, a 17 gauge coaxial introducer needle was directed to the periphery of the lymph node. Next, a total of 3, 18 gauge core biopsies were obtained and placed in formalin. The introducer needle was removed. Postprocedure ultrasound evaluation demonstrated no evidence of surrounding  hematoma or other complicating features. A sterile bandage was applied. The patient tolerated the procedure well was discharged home in good condition. IMPRESSION: Technically successful ultrasound-guided left cervical lymph node biopsy. [name], MD [facility] Electronically Signed   By: [name] M.D.   On: 03/11/2023 15:23      IMPRESSION/PLAN:  This is a delightful patient with head and neck cancer. I *** recommend radiotherapy for this patient.  We discussed the potential risks, benefits, and side effects of radiotherapy. We talked in detail about  acute and late effects. We discussed that some of the most bothersome acute effects may be mucositis, dysgeusia, salivary changes, skin irritation, hair loss, dehydration, weight loss and fatigue. We talked about late effects which include but are not necessarily limited to dysphagia, hypothyroidism, nerve injury, vascular injury, spinal cord injury, xerostomia, trismus, neck edema, dental issues, non-healing wound, and potentially fatal injury to any of the tissues in the head and neck region. No guarantees of treatment were given. A consent form was signed and placed in the patient's medical record. The patient is enthusiastic about proceeding with treatment. I look forward to participating in the patient's care.    Simulation (treatment planning) will take place ***  We also discussed that the treatment of head and neck cancer is a multidisciplinary process to maximize treatment outcomes and quality of life. For this reason the following referrals have been or will be made:  *** Medical oncology to discuss chemotherapy   *** Dentistry for dental evaluation, possible extractions in the radiation fields, and /or advice on reducing risk of cavities, osteoradionecrosis, or other oral issues.  *** Nutritionist for nutrition support during and after treatment.  *** Speech language pathology for swallowing and/or speech therapy.  *** Social work for social support.   *** Physical therapy due to risk of lymphedema in neck and deconditioning.  *** Baseline labs including TSH.  On date of service, in total, I spent *** minutes on this encounter. Patient was seen in person.  __________________________________________   Lonie Peak, MD  This document serves as a record of services personally performed by Lonie Peak, MD. It was created on her behalf by Neena Rhymes, a trained medical scribe. The creation of this record is based on the scribe's personal observations and the provider's statements  to them. This document has been checked and approved by the attending provider.

## 2023-04-05 NOTE — Telephone Encounter (Signed)
Rn called pt wife for meaningful use and nurse evaluation information. Consult note completed and routed to Dr. Basilio Cairo and Quitman Livings PA-C.

## 2023-04-05 NOTE — Telephone Encounter (Signed)
RN called pt in an attempt to get meaningful use and nurse evaluation information. Pt wife answered the phone and stated that the pt had his biopsy done today (several areas) as well complete tonsillectomy. Pt wife did know know if pt would feel like coming in for consult on Tuesday. Rn will reach out to Dr. Basilio Cairo to see if consult plan will need to be changed and then let pt wife know.

## 2023-04-06 ENCOUNTER — Ambulatory Visit
Admission: RE | Admit: 2023-04-06 | Discharge: 2023-04-06 | Disposition: A | Discharge status: Home / Self Care | Payer: BC Managed Care – PPO | Source: Ambulatory Visit | Attending: Radiation Oncology | Admitting: Radiation Oncology

## 2023-04-06 ENCOUNTER — Other Ambulatory Visit: Payer: Self-pay

## 2023-04-06 VITALS — BP 118/91 | HR 85 | Temp 97.6°F | Resp 18 | Ht 68.0 in | Wt 161.8 lb

## 2023-04-06 DIAGNOSIS — Z79899 Other long term (current) drug therapy: Secondary | ICD-10-CM | POA: Diagnosis not present

## 2023-04-06 DIAGNOSIS — C801 Malignant (primary) neoplasm, unspecified: Secondary | ICD-10-CM

## 2023-04-06 DIAGNOSIS — K219 Gastro-esophageal reflux disease without esophagitis: Secondary | ICD-10-CM | POA: Diagnosis not present

## 2023-04-06 DIAGNOSIS — E785 Hyperlipidemia, unspecified: Secondary | ICD-10-CM | POA: Diagnosis not present

## 2023-04-06 DIAGNOSIS — M47816 Spondylosis without myelopathy or radiculopathy, lumbar region: Secondary | ICD-10-CM | POA: Diagnosis not present

## 2023-04-06 DIAGNOSIS — C778 Secondary and unspecified malignant neoplasm of lymph nodes of multiple regions: Secondary | ICD-10-CM | POA: Insufficient documentation

## 2023-04-06 DIAGNOSIS — C770 Secondary and unspecified malignant neoplasm of lymph nodes of head, face and neck: Secondary | ICD-10-CM | POA: Diagnosis not present

## 2023-04-06 DIAGNOSIS — I2510 Atherosclerotic heart disease of native coronary artery without angina pectoris: Secondary | ICD-10-CM | POA: Diagnosis not present

## 2023-04-06 DIAGNOSIS — I700 Atherosclerosis of aorta: Secondary | ICD-10-CM | POA: Insufficient documentation

## 2023-04-06 DIAGNOSIS — Z8042 Family history of malignant neoplasm of prostate: Secondary | ICD-10-CM | POA: Diagnosis not present

## 2023-04-06 DIAGNOSIS — Z8616 Personal history of COVID-19: Secondary | ICD-10-CM | POA: Diagnosis not present

## 2023-04-06 DIAGNOSIS — C760 Malignant neoplasm of head, face and neck: Secondary | ICD-10-CM

## 2023-04-06 NOTE — Progress Notes (Signed)
Oncology Nurse Navigator Documentation   At the request of Dr. Basilio Cairo I have called [facility] and asked for EBV testing to be added to Mr. Deahl cervical node biopsy completed on 03/11/23. I was told that it would be requested. I will follow for results.   Hedda Slade RN, BSN, OCN Head & Neck Oncology Nurse Navigator [facility] Phone # [phone]  Fax # [phone]

## 2023-04-06 NOTE — Progress Notes (Signed)
Oncology Nurse Navigator Documentation   Met with patient during initial consult with Dr. Basilio Cairo. He was accompanied by his wife, Consuella Lose.  Further introduced myself as his/their Navigator, explained my role as a member of the Care Team. Provided New Patient resource guide binder: Contact information for physicians, this navigator, other members of the Care Team Advance Directive information; provided [facility] AD booklet at their request,  Fall Prevention Patient Safety Plan Financial Assistance Information sheet Symptom Management Clinic information [facility] campus map with highlight of [facility] SLP Information sheet Head and Neck cancer basics Nutrition information Patient and family support information including Spiritual care/Chaplain information, Peer mentor program, health and wellness classes, and the survivorship program Community resources  Provided and discussed educational handouts for PEG and PAC. Assisted with post-consult appt scheduling. They are aware that they will receive a phone call to set up an appointment with [facility] to have Scatter Protective Devices made before his CT simulation.  I toured him to the [facility] treatment area, explained procedures for lobby registration, arrival to Radiation Waiting, and arrival to treatment area.    They verbalized understanding of information provided. I encouraged them to call with questions/concerns moving forward.  Hedda Slade, RN, BSN, OCN Head & Neck Oncology Nurse Navigator [facility] [place] [phone]

## 2023-04-07 ENCOUNTER — Inpatient Hospital Stay: Payer: BC Managed Care – PPO | Admitting: Oncology

## 2023-04-07 ENCOUNTER — Encounter: Payer: Self-pay | Admitting: Radiation Oncology

## 2023-04-07 DIAGNOSIS — C760 Malignant neoplasm of head, face and neck: Secondary | ICD-10-CM | POA: Insufficient documentation

## 2023-04-07 DIAGNOSIS — C770 Secondary and unspecified malignant neoplasm of lymph nodes of head, face and neck: Secondary | ICD-10-CM | POA: Insufficient documentation

## 2023-04-09 ENCOUNTER — Telehealth: Payer: Self-pay | Admitting: *Deleted

## 2023-04-15 ENCOUNTER — Inpatient Hospital Stay: Payer: BC Managed Care – PPO | Attending: Oncology | Admitting: Oncology

## 2023-04-15 VITALS — BP 121/72 | HR 73 | Temp 98.1°F | Resp 18 | Ht 68.0 in | Wt 160.2 lb

## 2023-04-15 DIAGNOSIS — C801 Malignant (primary) neoplasm, unspecified: Secondary | ICD-10-CM

## 2023-04-15 DIAGNOSIS — C779 Secondary and unspecified malignant neoplasm of lymph node, unspecified: Secondary | ICD-10-CM | POA: Diagnosis not present

## 2023-04-15 NOTE — Progress Notes (Signed)
[facility] New Patient Consult   Requesting MD: Pincus Sanes, Md [address] [place],  [place] [address]   Peter [name] 57 y.o.  1965-12-23    Reason for Consult: Squamous cell carcinoma involving a left cervical lymph node   HPI: Mr. Peter [name] reports feeling completely well.  He noted an enlarged lymph node in the left neck on 01/21/2023.  He was referred to ENT on 01/28/2023.  A 1 cm mobile left level 2 node was palpated.  A CT of the neck 02/03/2023 revealed a 3.1 cm cystic/necrotic left level 2 lymph node.  No primary malignancy was seen. Dr. Jenne [name] performed an FNA biopsy on 02/18/2023.  Clear yellow fluid was aspirated with the FNA.  The cytology revealed atypical dyskeratotic squamous cells suspicious for malignancy.  An ultrasound core biopsy on 03/11/2023 revealed metastatic p16 positive squamous cell carcinoma.  An EBV stain was negative.  A staging PET scan 03/19/2023 revealed a 2.3 x 1.9 cm left level 2 cervical node with an SUV of 5.5.  No other hypermetabolic cervical nodes.  Symmetric hypermetabolic activity was noted in the tonsils.  No primary thanks the neck malignancy was identified.  No evidence of metastatic disease in the chest, abdomen, or pelvis.  He was referred to Dr.Slijepcevic and underwent bilateral tonsillectomy and direct laryngoscopy with biopsies from the base of the tongue, piriform sinus, nasopharynx and epiglottis were obtained.  The pathology returned negative for malignancy.  He saw Dr. Basilio [name] and is a candidate for definitive radiation.  His case was presented at parsed tumor board.  He is also a candidate for a lingual tonsillectomy and left neck dissection.  Mr. Peter [name] has not yet decided on surgery versus radiation.  Past Medical History:  Diagnosis Date   COVID 04/04/2021   states mild sx   Degenerative disc disease, lumbar    Dr. Shelle Iron   Dyslipidemia    GERD (gastroesophageal reflux disease)    Helicobacter pylori antibody  positive 02/21/2005   treated with antibiotics    Past Surgical History:  Procedure Laterality Date   COLONOSCOPY  2013   normal   ESOPHAGOGASTRODUODENOSCOPY  2007   Dr. Evette Cristal   NASAL SINUS SURGERY  1986    Medications: Reviewed  Allergies: No Known Allergies  Family history: No family history of cancer  Social History:   He lives with his wife in [place].  He has 2 sons.  He works in [employer], political party.  Has not used cigarettes.  Rare alcohol use.  No transfusion history.  No risk factor for HIV or hepatitis.  ROS:   Positives include: Throat soreness following the bilateral tonsillectomy and oropharyngeal biopsies  A complete ROS was otherwise negative.  Physical Exam:  Blood pressure 121/72, pulse 73, temperature 98.1 F (36.7 C), temperature source Oral, resp. rate 18, height 5\' 8"  (1.727 m), weight 160 lb 3.2 oz (72.7 kg), SpO2 99%.  HEENT: Erythema/inflammation at the tonsillar fossa bilaterally and oral cavity biopsy sites, no mass Lungs: Clear bilaterally Cardiac: Regular rate and rhythm Abdomen: No hepatosplenomegaly Vascular: No leg edema Lymph nodes: Approximate 1.5 cm high left cervical node anterior to the jugular musculature, no other palpable cervical, supraclavicular, axillary, or inguinal nodes Neurologic: Alert and oriented, the motor exam appears intact in the upper and lower extremities bilaterally Skin: No rash, no suspicious lesion over the neck or scalp Musculoskeletal: No spine tenderness   LAB:  CBC  Lab Results  Component Value Date   WBC  7.0 03/25/2023   HGB 15.0 03/25/2023   HCT 46.0 03/25/2023   MCV 95.2 03/25/2023   PLT 221.0 03/25/2023   NEUTROABS 4.1 03/25/2023        CMP  Lab Results  Component Value Date   NA 137 03/25/2023   K 3.7 03/25/2023   CL 102 03/25/2023   CO2 29 03/25/2023   GLUCOSE 91 03/25/2023   BUN 14 03/25/2023   CREATININE 0.99 03/25/2023   CALCIUM 9.2 03/25/2023   PROT 7.5 03/25/2023   ALBUMIN 4.2  03/25/2023   AST 22 03/25/2023   ALT 22 03/25/2023   ALKPHOS 68 03/25/2023   BILITOT 0.5 03/25/2023   GFRNONAA 87 05/25/2019   GFRAA 100 05/25/2019     No results found for: "CEA1", "ZOX096", "CA125"  Imaging: As per HPI, CT and pet images reviewed with Mr. Peter [name] and his wife  Assessment/Plan:   Squamous cell carcinoma involving a level 2 cervical lymph node, HPV positive, no primary tumor site identified Clinical N1 disease, ([id number]) CT neck 02/03/2023 -3.1 cm cystic/necrotic left level 2A lymph node FNA biopsy 02/18/2023-atypical dyskeratotic squamous cells suspicious for malignancy Ultrasound core biopsy 03/11/2023-lymph node with metastatic p16 positive squamous cell carcinoma, EBV negative PET 03/18/2023-hypermetabolic left level 2A cervical node (2.3 x 1.9 cm), no primary head neck malignancy identified, no evidence of metastatic disease in the chest, abdomen, or pelvis Bilateral tonsillectomy and biopsies of the base of the tongue, epiglottis, piriform sinus, and nasopharynx 04/05/2023-negative for malignancy    Disposition:   Mr. Peter [name] has been diagnosed with metastatic squamous cell carcinoma involving a left level 2 cervical lymph node.  There is no evidence of a primary tumor site after multiple head and neck examinations and a staging PET scan.  He most likely has an occult head and neck primary.  He has been evaluated by ENT and Dr. Basilio [name].  Dr. Basilio [name] feels he is a candidate for definitive radiation.  [facility] E[facility] team feels surgery with a lingual tonsillectomy left neck dissection is also an option.  He understands the chance of relapse with the need for radiation or chemotherapy/radiation if he opts for the surgical approach.  There is no clear role for chemotherapy at present.  Mr. Peter [name] will consult with Dr. Basilio [name] and the ENT service prior to making decision on treatment.  He will follow-up with radiation oncology and ENT for posttreatment surveillance.  He  is not scheduled for a follow-up appointment in the oncology clinic.  I am available to see him in the future as needed.  Thornton Papas, MD  04/15/2023, 1:45 PM

## 2023-04-16 ENCOUNTER — Inpatient Hospital Stay: Payer: BC Managed Care – PPO | Admitting: Oncology

## 2023-04-19 DIAGNOSIS — Z91843 Risk for dental caries, high: Secondary | ICD-10-CM | POA: Diagnosis not present

## 2023-04-19 DIAGNOSIS — C801 Malignant (primary) neoplasm, unspecified: Secondary | ICD-10-CM | POA: Diagnosis not present

## 2023-04-19 DIAGNOSIS — C779 Secondary and unspecified malignant neoplasm of lymph node, unspecified: Secondary | ICD-10-CM | POA: Diagnosis not present

## 2023-04-20 ENCOUNTER — Ambulatory Visit: Payer: BC Managed Care – PPO | Admitting: Radiation Oncology

## 2023-04-22 DIAGNOSIS — C779 Secondary and unspecified malignant neoplasm of lymph node, unspecified: Secondary | ICD-10-CM | POA: Diagnosis not present

## 2023-04-22 DIAGNOSIS — C801 Malignant (primary) neoplasm, unspecified: Secondary | ICD-10-CM | POA: Diagnosis not present

## 2023-04-22 DIAGNOSIS — C770 Secondary and unspecified malignant neoplasm of lymph nodes of head, face and neck: Secondary | ICD-10-CM | POA: Diagnosis not present

## 2023-04-23 ENCOUNTER — Ambulatory Visit: Payer: BC Managed Care – PPO | Admitting: Radiation Oncology

## 2023-04-27 ENCOUNTER — Ambulatory Visit: Payer: BC Managed Care – PPO | Admitting: Radiation Oncology

## 2023-04-27 ENCOUNTER — Encounter: Payer: Self-pay | Admitting: Internal Medicine

## 2023-04-27 ENCOUNTER — Telehealth (INDEPENDENT_AMBULATORY_CARE_PROVIDER_SITE_OTHER): Payer: BC Managed Care – PPO | Admitting: Family Medicine

## 2023-04-27 VITALS — HR 70 | Temp 102.5°F

## 2023-04-27 DIAGNOSIS — U071 COVID-19: Secondary | ICD-10-CM

## 2023-04-27 DIAGNOSIS — C801 Malignant (primary) neoplasm, unspecified: Secondary | ICD-10-CM

## 2023-04-27 DIAGNOSIS — C779 Secondary and unspecified malignant neoplasm of lymph node, unspecified: Secondary | ICD-10-CM

## 2023-04-27 MED ORDER — NIRMATRELVIR/RITONAVIR (PAXLOVID)TABLET: 3.0000 | ORAL_TABLET | Freq: Two times a day (BID) | ORAL | 0 refills | Status: AC

## 2023-04-27 NOTE — Progress Notes (Signed)
Virtual Visit via Video Note  I connected with Peter [name] on 04/27/2023 at  4:00 PM EDT by a video enabled telemedicine application and verified that I am speaking with the correct person using two identifiers.  Location: Patient: home Provider: office   I discussed the limitations of evaluation and management by telemedicine and the availability of in person appointments. The patient expressed understanding and agreed to proceed.  History of Present Illness:  Chief Complaint: Positive COVID-19 test result, concerns about symptoms and management.  History of Present Illness:  COVID-19: Peter [name] tested positive for COVID-19. He reports his usual pulse oximetry (SpO2) is around 95%, and his heart rate is around 70 bpm with some fluctuation. He denies having any chest pain or shortness of breath but acknowledges having nasal congestion, cough, and fever up to 102.4F, however he is defervesced with antipyretics. 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 Recommend over-the-counter medications for symptomatic relief: Flonase for nasal congestion. Mucinex or dextromethorphan for cough. Tylenol or ibuprofen  for fever management. Advise hydration and monitoring for any worsening symptoms such as chest pain or shortness of breath. Review quarantine recommendations: Isolate for five days from symptom onset, followed by wearing a mask for an additional five days. Encourage getting COVID-19 booster three months post-recovery and annual flu shots.  Follow Up Instructions: No follow-ups on file.    I discussed the assessment and treatment plan with the patient. The patient was provided an opportunity to ask questions and all were answered. The patient agreed with the plan and demonstrated an understanding of the instructions.   The patient was advised to call back or seek an in-person evaluation if the symptoms worsen or if the condition fails to improve as anticipated.  Garnette Gunner, MD

## 2023-04-28 ENCOUNTER — Telehealth: Payer: BC Managed Care – PPO | Admitting: Internal Medicine

## 2023-05-05 ENCOUNTER — Ambulatory Visit
Admission: RE | Admit: 2023-05-05 | Discharge: 2023-05-05 | Disposition: A | Discharge status: Home / Self Care | Payer: BC Managed Care – PPO | Source: Ambulatory Visit | Attending: Radiation Oncology | Admitting: Radiation Oncology

## 2023-05-05 ENCOUNTER — Other Ambulatory Visit: Payer: Self-pay

## 2023-05-05 ENCOUNTER — Ambulatory Visit
Admission: RE | Admit: 2023-05-05 | Discharge: 2023-05-05 | Disposition: A | Discharge status: Home / Self Care | Payer: BC Managed Care – PPO | Source: Ambulatory Visit | Attending: Radiation Oncology

## 2023-05-05 ENCOUNTER — Ambulatory Visit: Payer: BC Managed Care – PPO | Admitting: Radiation Oncology

## 2023-05-05 ENCOUNTER — Telehealth: Payer: Self-pay

## 2023-05-05 DIAGNOSIS — C801 Malignant (primary) neoplasm, unspecified: Secondary | ICD-10-CM

## 2023-05-05 DIAGNOSIS — C770 Secondary and unspecified malignant neoplasm of lymph nodes of head, face and neck: Secondary | ICD-10-CM | POA: Insufficient documentation

## 2023-05-05 DIAGNOSIS — Z510 Encounter for antineoplastic radiation therapy: Secondary | ICD-10-CM | POA: Insufficient documentation

## 2023-05-05 LAB — BUN & CREATININE (CHCC)
BUN: 15 mg/dL (ref 6–20)
Creatinine: 0.88 mg/dL (ref 0.61–1.24)
GFR, Estimated: >60 mL/min (ref >60)

## 2023-05-05 MED ORDER — SODIUM CHLORIDE FLUSH 0.9 % IV SOLN
3.0000 mL | Freq: Once | INTRAVENOUS | Status: AC
  Administered 2023-05-05: 3 mL via INTRAVENOUS
  Filled 2023-05-05: qty 3

## 2023-05-05 NOTE — Progress Notes (Signed)
Oncology Nurse Navigator Documentation   To provide support, encouragement and care continuity, met with Peter [name] after his CT SIM. He tolerated procedure without difficulty, denied questions/concerns.   I explained procedures for lobby registration, arrival to Radiation Waiting, and arrival to treatment area.  He voiced understanding.   I encouraged him to call me prior to 05/13/23 New Start.   Hedda Slade RN, BSN, OCN Head & Neck Oncology Nurse Navigator [facility] at [facility] Phone # [phone]  Fax # [phone]

## 2023-05-05 NOTE — Progress Notes (Signed)
Bun and crt ordered for ct sim today

## 2023-05-05 NOTE — Progress Notes (Signed)
Has armband been applied?  Yes.    Does patient have an allergy to IV contrast dye?: No.   Has patient ever received premedication for IV contrast dye?: No.   Does patient take metformin?: No.  If patient does take metformin when was the last dose: not applicable  Date of lab work: 05-05-23 BUN: 15 CR: 0.88 eGfr: greater than 60  IV site: forearm left, condition patent and no redness  Has IV site been added to flowsheet?  Yes.    There were no vitals taken for this visit.

## 2023-05-05 NOTE — Telephone Encounter (Signed)
Rn called pt to let him know of plan for CT sim today that will now include an iv to be started. Pt will also need labs. Rn informed pt of what time to  here to get labs done. Pt understood this plan and had no questions.

## 2023-05-10 ENCOUNTER — Ambulatory Visit: Payer: BC Managed Care – PPO | Admitting: Radiation Oncology

## 2023-05-11 ENCOUNTER — Ambulatory Visit: Payer: BC Managed Care – PPO

## 2023-05-12 ENCOUNTER — Ambulatory Visit: Payer: BC Managed Care – PPO

## 2023-05-12 DIAGNOSIS — C770 Secondary and unspecified malignant neoplasm of lymph nodes of head, face and neck: Secondary | ICD-10-CM | POA: Diagnosis not present

## 2023-05-12 DIAGNOSIS — Z510 Encounter for antineoplastic radiation therapy: Secondary | ICD-10-CM | POA: Diagnosis not present

## 2023-05-12 DIAGNOSIS — C801 Malignant (primary) neoplasm, unspecified: Secondary | ICD-10-CM | POA: Diagnosis not present

## 2023-05-13 ENCOUNTER — Ambulatory Visit: Payer: BC Managed Care – PPO

## 2023-05-13 ENCOUNTER — Other Ambulatory Visit: Payer: Self-pay

## 2023-05-13 ENCOUNTER — Ambulatory Visit
Admission: RE | Admit: 2023-05-13 | Discharge: 2023-05-13 | Disposition: A | Discharge status: Home / Self Care | Payer: BC Managed Care – PPO | Source: Ambulatory Visit | Attending: Radiation Oncology | Admitting: Radiation Oncology

## 2023-05-13 DIAGNOSIS — C801 Malignant (primary) neoplasm, unspecified: Secondary | ICD-10-CM | POA: Diagnosis not present

## 2023-05-13 DIAGNOSIS — Z510 Encounter for antineoplastic radiation therapy: Secondary | ICD-10-CM | POA: Diagnosis not present

## 2023-05-13 DIAGNOSIS — C770 Secondary and unspecified malignant neoplasm of lymph nodes of head, face and neck: Secondary | ICD-10-CM | POA: Diagnosis not present

## 2023-05-13 LAB — RAD ONC ARIA SESSION SUMMARY
Course Elapsed Days: 0
Plan Fractions Treated to Date: 1
Plan Prescribed Dose Per Fraction: 2 Gy
Plan Total Fractions Prescribed: 35
Plan Total Prescribed Dose: 70 Gy
Reference Point Dosage Given to Date: 2 Gy
Reference Point Session Dosage Given: 2 Gy
Session Number: 1

## 2023-05-14 ENCOUNTER — Ambulatory Visit: Payer: BC Managed Care – PPO

## 2023-05-14 ENCOUNTER — Other Ambulatory Visit: Payer: Self-pay

## 2023-05-14 ENCOUNTER — Ambulatory Visit
Admission: RE | Admit: 2023-05-14 | Discharge: 2023-05-14 | Disposition: A | Discharge status: Home / Self Care | Payer: BC Managed Care – PPO | Source: Ambulatory Visit | Attending: Radiation Oncology

## 2023-05-14 DIAGNOSIS — C770 Secondary and unspecified malignant neoplasm of lymph nodes of head, face and neck: Secondary | ICD-10-CM | POA: Diagnosis not present

## 2023-05-14 DIAGNOSIS — C801 Malignant (primary) neoplasm, unspecified: Secondary | ICD-10-CM | POA: Diagnosis not present

## 2023-05-14 DIAGNOSIS — Z510 Encounter for antineoplastic radiation therapy: Secondary | ICD-10-CM | POA: Diagnosis not present

## 2023-05-14 LAB — RAD ONC ARIA SESSION SUMMARY
Course Elapsed Days: 1
Plan Fractions Treated to Date: 2
Plan Prescribed Dose Per Fraction: 2 Gy
Plan Total Fractions Prescribed: 35
Plan Total Prescribed Dose: 70 Gy
Reference Point Dosage Given to Date: 4 Gy
Reference Point Session Dosage Given: 2 Gy
Session Number: 2

## 2023-05-14 NOTE — Progress Notes (Signed)
Oncology Nurse Navigator Documentation   To provide support, encouragement and care continuity, met with Peter [name] after his second radiation treatment. He was accompanied by his wife. I reviewed the 2-step treatment process, answered questions.  [name] completed treatment without difficulty, denied questions/concerns. I reviewed the registration/arrival procedure for subsequent treatments. I encouraged them to call me with questions/concerns as treatments proceed.   Hedda Slade RN, BSN, OCN Head & Neck Oncology Nurse Navigator [facility] at [facility] Phone # [phone]  Fax # [phone]

## 2023-05-17 ENCOUNTER — Ambulatory Visit: Payer: BC Managed Care – PPO

## 2023-05-17 ENCOUNTER — Other Ambulatory Visit: Payer: Self-pay

## 2023-05-17 ENCOUNTER — Ambulatory Visit
Admission: RE | Admit: 2023-05-17 | Discharge: 2023-05-17 | Disposition: A | Discharge status: Home / Self Care | Payer: BC Managed Care – PPO | Source: Ambulatory Visit | Attending: Radiation Oncology

## 2023-05-17 DIAGNOSIS — C801 Malignant (primary) neoplasm, unspecified: Secondary | ICD-10-CM | POA: Diagnosis not present

## 2023-05-17 DIAGNOSIS — C779 Secondary and unspecified malignant neoplasm of lymph node, unspecified: Secondary | ICD-10-CM

## 2023-05-17 DIAGNOSIS — Z510 Encounter for antineoplastic radiation therapy: Secondary | ICD-10-CM | POA: Diagnosis not present

## 2023-05-17 DIAGNOSIS — C770 Secondary and unspecified malignant neoplasm of lymph nodes of head, face and neck: Secondary | ICD-10-CM | POA: Diagnosis not present

## 2023-05-17 DIAGNOSIS — C760 Malignant neoplasm of head, face and neck: Secondary | ICD-10-CM

## 2023-05-17 LAB — RAD ONC ARIA SESSION SUMMARY
Course Elapsed Days: 4
Plan Fractions Treated to Date: 3
Plan Prescribed Dose Per Fraction: 2 Gy
Plan Total Fractions Prescribed: 35
Plan Total Prescribed Dose: 70 Gy
Reference Point Dosage Given to Date: 6 Gy
Reference Point Session Dosage Given: 2 Gy
Session Number: 3

## 2023-05-17 MED ORDER — SONAFINE EX EMUL
1.0000 | Freq: Two times a day (BID) | CUTANEOUS | Status: DC
  Administered 2023-05-17: 1 via TOPICAL

## 2023-05-18 ENCOUNTER — Ambulatory Visit: Payer: BC Managed Care – PPO

## 2023-05-18 ENCOUNTER — Ambulatory Visit
Admission: RE | Admit: 2023-05-18 | Discharge: 2023-05-18 | Disposition: A | Discharge status: Home / Self Care | Payer: BC Managed Care – PPO | Source: Ambulatory Visit | Attending: Radiation Oncology

## 2023-05-18 ENCOUNTER — Inpatient Hospital Stay: Payer: BC Managed Care – PPO | Attending: Oncology | Admitting: Dietician

## 2023-05-18 ENCOUNTER — Other Ambulatory Visit: Payer: Self-pay

## 2023-05-18 DIAGNOSIS — C801 Malignant (primary) neoplasm, unspecified: Secondary | ICD-10-CM | POA: Diagnosis not present

## 2023-05-18 DIAGNOSIS — Z510 Encounter for antineoplastic radiation therapy: Secondary | ICD-10-CM | POA: Diagnosis not present

## 2023-05-18 DIAGNOSIS — C770 Secondary and unspecified malignant neoplasm of lymph nodes of head, face and neck: Secondary | ICD-10-CM | POA: Diagnosis not present

## 2023-05-18 LAB — RAD ONC ARIA SESSION SUMMARY
Course Elapsed Days: 5
Plan Fractions Treated to Date: 4
Plan Prescribed Dose Per Fraction: 2 Gy
Plan Total Fractions Prescribed: 35
Plan Total Prescribed Dose: 70 Gy
Reference Point Dosage Given to Date: 8 Gy
Reference Point Session Dosage Given: 2 Gy
Session Number: 4

## 2023-05-18 NOTE — Progress Notes (Signed)
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 S/p bilateral tonsillectomy (8/12 [facility]). Patient is receiving adjuvant radiation therapy under the care of Dr. Basilio Cairo. He has completed 4 of 35 planned fractions.   No pertinent past medical history  Met with patient and wife prior to radiation treatment. Patient reports tolerating therapy well so far. He has noticed some dryness at the back of throat. Patient reports having a high metabolism and good appetite at baseline. He has a light breakfast (boiled egg, oatmeal, cereal). Recalls sandwich with chips for lunch. Had chicken, roasted red potatoes, green peas for dinner. Slice of pumpkin pie for dessert. Patient has Boost at home s/p recent tonsillectomy. He is drinking them as needed. Reports drinking one today after breakfast. Patient is drinking 80-96 oz of water. He is doing baking soda salt water rinses 3 times/day.    Nutrition Focused Physical Exam: deferred    Medications: reviewed    Labs: 8/1 labs reviewed    Anthropometrics:   Height: 5'8" Weight: 162.8 lb (9/23 [facility]) UBW: 165-170 lb  BMI: 24.8   Estimated Energy Needs  Kcals: 2250-2600 Protein: 105-128 Fluid: >2.3 L   NUTRITION DIAGNOSIS: Predicted suboptimal intake related to [facility] as evidenced by likely side effects of treatment (sore throat, altered taste, dry mouth/thick saliva) affecting ability to tolerate a regular diet   INTERVENTION:  Educated on importance of adequate calories and protein to maintain strength/weights during treatment Discussed strategies for adding calories/protein to foods (using milk vs water in oatmeal/soups, adding Boost to coffee/ice cream, adding gravy/butter/cheese) Educated on soft moist foods for ease of intake Continue baking soda salt water rinses Samples of Ensure, CIB + handouts provided   MONITORING, EVALUATION, GOAL: Pt will  tolerate adequate calories and protein to minimize wt loss during treatment    Next Visit: Tuesday October 1 after RT

## 2023-05-19 ENCOUNTER — Ambulatory Visit: Payer: BC Managed Care – PPO

## 2023-05-19 ENCOUNTER — Other Ambulatory Visit: Payer: Self-pay

## 2023-05-19 ENCOUNTER — Ambulatory Visit: Payer: BC Managed Care – PPO | Admitting: Physical Therapy

## 2023-05-19 ENCOUNTER — Ambulatory Visit
Admission: RE | Admit: 2023-05-19 | Discharge: 2023-05-19 | Disposition: A | Discharge status: Home / Self Care | Payer: BC Managed Care – PPO | Source: Ambulatory Visit | Attending: Radiation Oncology

## 2023-05-19 DIAGNOSIS — Z510 Encounter for antineoplastic radiation therapy: Secondary | ICD-10-CM | POA: Diagnosis not present

## 2023-05-19 DIAGNOSIS — C801 Malignant (primary) neoplasm, unspecified: Secondary | ICD-10-CM | POA: Diagnosis not present

## 2023-05-19 DIAGNOSIS — C770 Secondary and unspecified malignant neoplasm of lymph nodes of head, face and neck: Secondary | ICD-10-CM | POA: Diagnosis not present

## 2023-05-19 LAB — RAD ONC ARIA SESSION SUMMARY
Course Elapsed Days: 6
Plan Fractions Treated to Date: 5
Plan Prescribed Dose Per Fraction: 2 Gy
Plan Total Fractions Prescribed: 35
Plan Total Prescribed Dose: 70 Gy
Reference Point Dosage Given to Date: 10 Gy
Reference Point Session Dosage Given: 2 Gy
Session Number: 5

## 2023-05-20 ENCOUNTER — Other Ambulatory Visit: Payer: Self-pay

## 2023-05-20 ENCOUNTER — Ambulatory Visit
Admission: RE | Admit: 2023-05-20 | Discharge: 2023-05-20 | Disposition: A | Discharge status: Home / Self Care | Payer: BC Managed Care – PPO | Source: Ambulatory Visit | Attending: Radiation Oncology | Admitting: Radiation Oncology

## 2023-05-20 ENCOUNTER — Ambulatory Visit: Payer: BC Managed Care – PPO

## 2023-05-20 ENCOUNTER — Inpatient Hospital Stay: Payer: BC Managed Care – PPO

## 2023-05-20 DIAGNOSIS — C770 Secondary and unspecified malignant neoplasm of lymph nodes of head, face and neck: Secondary | ICD-10-CM | POA: Diagnosis not present

## 2023-05-20 DIAGNOSIS — Z510 Encounter for antineoplastic radiation therapy: Secondary | ICD-10-CM | POA: Diagnosis not present

## 2023-05-20 DIAGNOSIS — C801 Malignant (primary) neoplasm, unspecified: Secondary | ICD-10-CM | POA: Diagnosis not present

## 2023-05-20 LAB — RAD ONC ARIA SESSION SUMMARY
Course Elapsed Days: 7
Plan Fractions Treated to Date: 6
Plan Prescribed Dose Per Fraction: 2 Gy
Plan Total Fractions Prescribed: 35
Plan Total Prescribed Dose: 70 Gy
Reference Point Dosage Given to Date: 12 Gy
Reference Point Session Dosage Given: 2 Gy
Session Number: 6

## 2023-05-20 NOTE — Progress Notes (Signed)
[facility] Clinical Social Work  Clinical Social Work was referred by new patient protocol for assessment of psychosocial needs.  Clinical Social Worker contacted patient by phone to offer support and assess for needs. CSW introduced herself and support services available to patient. CSW reviewed SDOH screening, no concerns at this time.  No follow up at this time.  Marguerita Merles, LCSWA Clinical Social Worker [facility]

## 2023-05-21 ENCOUNTER — Other Ambulatory Visit: Payer: Self-pay

## 2023-05-21 ENCOUNTER — Ambulatory Visit
Admission: RE | Admit: 2023-05-21 | Discharge: 2023-05-21 | Disposition: A | Discharge status: Home / Self Care | Payer: BC Managed Care – PPO | Source: Ambulatory Visit | Attending: Radiation Oncology

## 2023-05-21 ENCOUNTER — Ambulatory Visit: Payer: BC Managed Care – PPO

## 2023-05-21 DIAGNOSIS — Z510 Encounter for antineoplastic radiation therapy: Secondary | ICD-10-CM | POA: Diagnosis not present

## 2023-05-21 DIAGNOSIS — C801 Malignant (primary) neoplasm, unspecified: Secondary | ICD-10-CM | POA: Diagnosis not present

## 2023-05-21 DIAGNOSIS — C770 Secondary and unspecified malignant neoplasm of lymph nodes of head, face and neck: Secondary | ICD-10-CM | POA: Diagnosis not present

## 2023-05-21 LAB — RAD ONC ARIA SESSION SUMMARY
Course Elapsed Days: 8
Plan Fractions Treated to Date: 7
Plan Prescribed Dose Per Fraction: 2 Gy
Plan Total Fractions Prescribed: 35
Plan Total Prescribed Dose: 70 Gy
Reference Point Dosage Given to Date: 14 Gy
Reference Point Session Dosage Given: 2 Gy
Session Number: 7

## 2023-05-24 ENCOUNTER — Other Ambulatory Visit: Payer: Self-pay | Admitting: Radiation Oncology

## 2023-05-24 ENCOUNTER — Ambulatory Visit: Payer: BC Managed Care – PPO

## 2023-05-24 ENCOUNTER — Ambulatory Visit
Admission: RE | Admit: 2023-05-24 | Discharge: 2023-05-24 | Disposition: A | Discharge status: Home / Self Care | Payer: BC Managed Care – PPO | Source: Ambulatory Visit | Attending: Radiation Oncology

## 2023-05-24 ENCOUNTER — Ambulatory Visit
Admission: RE | Admit: 2023-05-24 | Discharge: 2023-05-24 | Disposition: A | Discharge status: Home / Self Care | Payer: BC Managed Care – PPO | Source: Ambulatory Visit | Attending: Radiation Oncology | Admitting: Radiation Oncology

## 2023-05-24 ENCOUNTER — Other Ambulatory Visit: Payer: Self-pay

## 2023-05-24 DIAGNOSIS — C801 Malignant (primary) neoplasm, unspecified: Secondary | ICD-10-CM | POA: Diagnosis not present

## 2023-05-24 DIAGNOSIS — C770 Secondary and unspecified malignant neoplasm of lymph nodes of head, face and neck: Secondary | ICD-10-CM | POA: Diagnosis not present

## 2023-05-24 DIAGNOSIS — Z510 Encounter for antineoplastic radiation therapy: Secondary | ICD-10-CM | POA: Diagnosis not present

## 2023-05-24 DIAGNOSIS — C760 Malignant neoplasm of head, face and neck: Secondary | ICD-10-CM

## 2023-05-24 LAB — RAD ONC ARIA SESSION SUMMARY
Course Elapsed Days: 11
Plan Fractions Treated to Date: 8
Plan Prescribed Dose Per Fraction: 2 Gy
Plan Total Fractions Prescribed: 35
Plan Total Prescribed Dose: 70 Gy
Reference Point Dosage Given to Date: 16 Gy
Reference Point Session Dosage Given: 2 Gy
Session Number: 8

## 2023-05-24 MED ORDER — ONDANSETRON HCL 8 MG PO TABS
8.0000 mg | ORAL_TABLET | Freq: Three times a day (TID) | ORAL | 2 refills | Status: DC | PRN
Start: 2023-05-24 — End: 2024-01-13

## 2023-05-24 MED ORDER — LIDOCAINE VISCOUS HCL 2 % MT SOLN
OROMUCOSAL | 3 refills | Status: DC
Start: 2023-05-24 — End: 2023-08-03

## 2023-05-25 ENCOUNTER — Ambulatory Visit
Admission: RE | Admit: 2023-05-25 | Discharge: 2023-05-25 | Disposition: A | Discharge status: Home / Self Care | Payer: BC Managed Care – PPO | Source: Ambulatory Visit | Attending: Radiation Oncology | Admitting: Radiation Oncology

## 2023-05-25 ENCOUNTER — Other Ambulatory Visit: Payer: Self-pay

## 2023-05-25 ENCOUNTER — Inpatient Hospital Stay: Payer: BC Managed Care – PPO | Attending: Oncology | Admitting: Dietician

## 2023-05-25 ENCOUNTER — Ambulatory Visit: Payer: BC Managed Care – PPO

## 2023-05-25 DIAGNOSIS — Z510 Encounter for antineoplastic radiation therapy: Secondary | ICD-10-CM | POA: Insufficient documentation

## 2023-05-25 DIAGNOSIS — C770 Secondary and unspecified malignant neoplasm of lymph nodes of head, face and neck: Secondary | ICD-10-CM | POA: Diagnosis not present

## 2023-05-25 DIAGNOSIS — C801 Malignant (primary) neoplasm, unspecified: Secondary | ICD-10-CM | POA: Insufficient documentation

## 2023-05-25 LAB — RAD ONC ARIA SESSION SUMMARY
Course Elapsed Days: 12
Plan Fractions Treated to Date: 9
Plan Prescribed Dose Per Fraction: 2 Gy
Plan Total Fractions Prescribed: 35
Plan Total Prescribed Dose: 70 Gy
Reference Point Dosage Given to Date: 18 Gy
Reference Point Session Dosage Given: 2 Gy
Session Number: 9

## 2023-05-25 NOTE — Progress Notes (Signed)
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 S/p bilateral tonsillectomy (8/12 [facility]). Patient is receiving adjuvant radiation therapy under the care of Dr. Basilio Cairo. He has completed 9 of 35 planned fractions.   Met with pt and wife in office following therapy. He reports tolerating treatment well overall. Pt has dry mouth and states his taste buds "are gone" as of yesterday. Reports everything tasting like a combination of flowers, baking soda, and salt. He continues pushing nutrition, eating several small meals and has added a bedtime snack. Had a bowl of vanilla ice cream with walnuts last night. Recalls 2 hard boiled eggs with bowl of grits for breakfast. Patient drinking 80-96 oz of water. He reports mild intermittent "queasiness" that does not interfere with oral intake.    Medications: zofran, lidocaine (9/30)  Labs: no new labs  Anthropometrics: Wt 163.4 lb Peter [name]) on 9/30 - slightly increased  9/23 - 162.8 lb   Estimated Energy Needs  Kcals: 2250-2600 Protein: 105-128 Fluid: >2.3L  NUTRITION DIAGNOSIS: Predicted suboptimal intake continues    INTERVENTION:  Encouraged high calorie high protein foods in soft moist textures Continue baking soda salt water rinses several times daily  Continue bedtime snack Encouraged pt to drink Boost if oral intake declines     MONITORING, EVALUATION, GOAL: wt trends, intake   NEXT VISIT: Tuesday October 8 after RT with Peter [name]

## 2023-05-26 ENCOUNTER — Ambulatory Visit: Payer: BC Managed Care – PPO

## 2023-05-26 ENCOUNTER — Ambulatory Visit
Admission: RE | Admit: 2023-05-26 | Discharge: 2023-05-26 | Disposition: A | Discharge status: Home / Self Care | Payer: BC Managed Care – PPO | Source: Ambulatory Visit | Attending: Radiation Oncology

## 2023-05-26 ENCOUNTER — Other Ambulatory Visit: Payer: Self-pay

## 2023-05-26 DIAGNOSIS — C770 Secondary and unspecified malignant neoplasm of lymph nodes of head, face and neck: Secondary | ICD-10-CM | POA: Diagnosis not present

## 2023-05-26 DIAGNOSIS — C801 Malignant (primary) neoplasm, unspecified: Secondary | ICD-10-CM | POA: Diagnosis not present

## 2023-05-26 DIAGNOSIS — Z510 Encounter for antineoplastic radiation therapy: Secondary | ICD-10-CM | POA: Diagnosis not present

## 2023-05-26 LAB — RAD ONC ARIA SESSION SUMMARY
Course Elapsed Days: 13
Plan Fractions Treated to Date: 10
Plan Prescribed Dose Per Fraction: 2 Gy
Plan Total Fractions Prescribed: 35
Plan Total Prescribed Dose: 70 Gy
Reference Point Dosage Given to Date: 20 Gy
Reference Point Session Dosage Given: 2 Gy
Session Number: 10

## 2023-05-27 ENCOUNTER — Ambulatory Visit
Admission: RE | Admit: 2023-05-27 | Discharge: 2023-05-27 | Disposition: A | Discharge status: Home / Self Care | Payer: BC Managed Care – PPO | Source: Ambulatory Visit | Attending: Radiation Oncology | Admitting: Radiation Oncology

## 2023-05-27 ENCOUNTER — Other Ambulatory Visit: Payer: Self-pay

## 2023-05-27 ENCOUNTER — Ambulatory Visit: Payer: BC Managed Care – PPO | Attending: Radiation Oncology

## 2023-05-27 ENCOUNTER — Ambulatory Visit: Payer: BC Managed Care – PPO

## 2023-05-27 ENCOUNTER — Ambulatory Visit: Payer: BC Managed Care – PPO | Attending: Radiation Oncology | Admitting: Physical Therapy

## 2023-05-27 ENCOUNTER — Encounter: Payer: Self-pay | Admitting: Physical Therapy

## 2023-05-27 DIAGNOSIS — C801 Malignant (primary) neoplasm, unspecified: Secondary | ICD-10-CM | POA: Insufficient documentation

## 2023-05-27 DIAGNOSIS — C779 Secondary and unspecified malignant neoplasm of lymph node, unspecified: Secondary | ICD-10-CM | POA: Insufficient documentation

## 2023-05-27 DIAGNOSIS — Z510 Encounter for antineoplastic radiation therapy: Secondary | ICD-10-CM | POA: Diagnosis not present

## 2023-05-27 DIAGNOSIS — C770 Secondary and unspecified malignant neoplasm of lymph nodes of head, face and neck: Secondary | ICD-10-CM | POA: Diagnosis not present

## 2023-05-27 DIAGNOSIS — R1310 Dysphagia, unspecified: Secondary | ICD-10-CM | POA: Insufficient documentation

## 2023-05-27 DIAGNOSIS — R293 Abnormal posture: Secondary | ICD-10-CM | POA: Diagnosis not present

## 2023-05-27 LAB — RAD ONC ARIA SESSION SUMMARY
Course Elapsed Days: 14
Plan Fractions Treated to Date: 11
Plan Prescribed Dose Per Fraction: 2 Gy
Plan Total Fractions Prescribed: 35
Plan Total Prescribed Dose: 70 Gy
Reference Point Dosage Given to Date: 22 Gy
Reference Point Session Dosage Given: 2 Gy
Session Number: 11

## 2023-05-27 NOTE — Therapy (Signed)
OUTPATIENT PHYSICAL THERAPY HEAD AND NECK BASELINE EVALUATION   Patient Name: Peter [name] MRN: [id number] DOB:December 07, 1965, 57 y.o., male Today's Date: 05/27/2023  END OF SESSION:  PT End of Session - 05/27/23 1236     Visit Number 1    Number of Visits 2    Date for PT Re-Evaluation 08/05/23    PT Start Time 1200    PT Stop Time 1225    PT Time Calculation (min) 25 min    Activity Tolerance Patient tolerated treatment well    Behavior During Therapy [facility] for tasks assessed/performed             Past Medical History:  Diagnosis Date   COVID 04/04/2021   states mild sx   Degenerative disc disease, lumbar    Dr. Shelle Iron   Dyslipidemia    GERD (gastroesophageal reflux disease)    Helicobacter pylori antibody positive 02/21/2005   treated with antibiotics   Past Surgical History:  Procedure Laterality Date   COLONOSCOPY  2013   normal   ESOPHAGOGASTRODUODENOSCOPY  2007   Dr. 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 He presented to Dr. Clovis Cao at [facility] on 01/28/23 with a left sided neck lump X 1 week. Physical exam revealed a 1 cm left level II somewhat mobile mass. No other palpable masses or enlarged were noted in the neck. 02/03/23 CT neck demonstrated a 3.1 cystic/necrotic left level IIA lymph node suspicious for metastatic disease. No definite primary was identified in the neck. 02/18/23 FNA collected by Dr. Jenne Pane showed findings consistent with atypical dyskeratotic squamous cells suspicious for malignancy; p16 negative. Given the patient's age and presence of dyskeratotic cells, the features were noted as suspicious for squamous cell carcinoma. Of note: clear yellow fluid was aspirated from the left neck mass during FNA. 03/11/23 core biopsy of left cervical node showed findings consistent with metastatic SCC, p 16 +. 03/18/23 PET demonstrated hypermetabolism associated with the left level IIA cervical lymph node, consistent with metastatic squamous cell carcinoma. PET otherwise showed no primary head and neck malignancy identified, and no evidence of metastatic disease in the chest, abdomen, or pelvis. Symmetric hypermetabolic activity was appreciated in both tonsillar regions, however this is likely physiologic or inflammatory in etiology. 03/26/23 Consult with Dr. Lou Cal, Laryngoscopy unremarkable. She recommended another biopsy under anesthesia. 04/05/23 Biopsies of throat (tonsil, base of tongue, epiglottis) all negative.  He will receive 35 fractions of radiation to his  Oropharynx and bilateral neck.  Treatment started 9/19 and will complete 11/6.  PATIENT GOALS:   to be educated about the signs and symptoms of lymphedema and learn post op HEP.   PAIN:  Are you having pain? No reports the top part of his throat is starting to get sore when he swallow  PRECAUTIONS: Active CA  RED  FLAGS: None   WEIGHT BEARING RESTRICTIONS: No  FALLS:  Has patient fallen in last 6 months? No Does the patient have a fear of falling that limits activity? No Is the patient reluctant to leave the house due to a fear of falling?No  LIVING ENVIRONMENT: Patient lives with: Peter [name] Lives in: House/apartment Has following equipment at home: None  OCCUPATION: full time job doing sells real estate  LEISURE: pt does not exercise  PRIOR LEVEL OF FUNCTION: Independent   OBJECTIVE: Note: Objective measures were completed at Evaluation unless otherwise noted.  COGNITION: Overall cognitive status: Within functional limits for tasks assessed                  POSTURE:  Forward head and rounded shoulders posture  30 SEC SIT TO STAND: 21 reps in 30 sec without use of UEs which is  Excellent for patient's age  SHOULDER AROM:   WFL   CERVICAL AROM:   Percent limited  Flexion WFL  Extension WFL  Right lateral flexion WFL  Left lateral flexion WFL  Right rotation WFL  Left rotation WFL    (Blank rows=not tested)  GAIT: WFL  PATIENT EDUCATION:  Education details: Neck ROM, importance of posture when sitting, standing and lying down, deep breathing, walking program and importance of staying active throughout treatment, CURE article on staying active, "Why exercise?" flyer, lymphedema and PT info Person educated: Patient Education method: Explanation, Demonstration, Handout Education comprehension: Patient verbalized understanding and returned demonstration  HOME EXERCISE PROGRAM: Patient was instructed today in a home exercise program today for head and neck range of motion exercises. These included active cervical flexion, active cervical extension, active cervical rotation to each direction, upper trap stretch, and shoulder retraction. Patient was encouraged to do these 2-3 times a day, holding for 5 sec each and completing for 5 reps. Pt was educated that once this  becomes easier then hold the stretches for 30-60 seconds.    ASSESSMENT:  CLINICAL IMPRESSION: Pt arrives to PT with recently diagnosed metastatic squamous cell carcinoma of lymph nodes with no known primary. He will receive 35 fractions of radiation to his Oropharynx and bilateral neck.  Treatment started 9/19 and will complete 11/6. Pt's cervical ROM was [facility]. Educated pt about signs and symptoms of lymphedema as well as anatomy and physiology of lymphatic system. Educated pt in importance of staying as active as possible throughout treatment to decrease fatigue as well as head and neck ROM exercises to decrease loss of ROM. Will see pt after completion of radiation to reassess ROM and assess for lymphedema and to determine therapy needs at that time.  Pt will benefit from skilled therapeutic intervention to improve on the following deficits: Decreased knowledge of precautions and postural dysfunction.   PT treatment/interventions: ADL/self-care home management, pt/family education, therapeutic exercise.   REHAB POTENTIAL: Good  CLINICAL DECISION MAKING: Stable/uncomplicated  EVALUATION COMPLEXITY: Low   GOALS: Goals reviewed with patient? YES  LONG TERM GOALS: (STG=LTG)   Name Target Date  Goal status  1 Patient will be able to verbalize understanding of a home exercise program for cervical range  of motion, posture, and walking.   Baseline:  No knowledge 05/27/2023 Achieved at eval  2 Patient will be able to verbalize understanding of proper sitting and standing posture. Baseline:  No knowledge 05/27/2023 Achieved at eval  3 Patient will be able to verbalize understanding of lymphedema risk and availability of treatment for this condition Baseline:  No knowledge 05/27/2023 Achieved at eval  4 Pt will demonstrate a return to full cervical ROM and function post operatively compared to baselines and not demonstrate any signs or symptoms of lymphedema.  Baseline: See objective  measurements taken today. 08/05/23 New    PLAN:  PT FREQUENCY/DURATION: EVAL and 1 follow up appointment.   PLAN FOR NEXT SESSION: will reassess 2 weeks after completion of radiation to determine needs.  Patient will follow up at outpatient cancer rehab 2 weeks after completion of radiation.  If the patient requires physical therapy at that time, a specific plan will be dictated and sent to the referring physician for approval. The patient was educated today on appropriate basic range of motion exercises to begin now and continue throughout radiation and educated on the signs and symptoms of lymphedema. Patient verbalized good understanding.     Physical Therapy Information for During and After Head/Neck Cancer Treatment: Lymphedema is a swelling condition that you may be at risk for in your neck and/or face if you have radiation treatment to the area and/or if you have surgery that includes removing lymph nodes.  There is treatment available for this condition and it is not life-threatening.  Contact your physician or physical therapist with concerns. An excellent resource for those seeking information on lymphedema is the National Lymphedema Network's website.  It can be accessed at www.lymphnet.org If you notice swelling in your neck or face at any time following surgery (even if it is many years from now), please contact your doctor or physical therapist to discuss this.  Lymphedema can be treated at any time but it is easier for you if it is treated early on. If you have had surgery to your neck, please check with your surgeon about how soon to start doing neck range of motion exercises.  If you are not having surgery, I encourage you to start doing neck range of motion exercises today and continue these while undergoing treatment, UNLESS you have irritation of your skin or soft tissue that is aggravated by doing them.  These exercises are intended to help you prevent loss of range of motion  and/or to gain range of motion in your neck (which can be limited by tightening effects of radiation), and NOT to aggravate these tissues if they develop sensitivities from treatment. Neck range of motion exercises should be done to the point of feeling a GENTLE, TOLERABLE stretch only.  You are encouraged to start a walking or other exercise program tomorrow and continue this as much as you are able through and after treatment.  Please feel free to call me with any questions. Leonette Most, PT, CLT Physical Therapist and Certified Lymphedema Therapist [facility] [address], [place], [place] [address] [phone] [email or web address]  WALKING  Walking is a great form of exercise to increase your strength, endurance and overall fitness.  A walking program can help you start slowly and gradually build endurance as you go.  Everyone's ability is different, so each person's starting point will be different.  You do not have to follow them exactly.  The are just samples. You should  simply find out what's right for you and stick to that program.   In the beginning, you'll start off walking 2-3 times a day for short distances.  As you get stronger, you'll be walking further at just 1-2 times per day.  A. You Can Walk For A Certain Length Of Time Each Day    Walk 5 minutes 3 times per day.  Increase 2 minutes every 2 days (3 times per day).  Work up to 25-30 minutes (1-2 times per day).   Example:   Day 1-2 5 minutes 3 times per day   Day 7-8 12 minutes 2-3 times per day   Day 13-14 25 minutes 1-2 times per day  B. You Can Walk For a Certain Distance Each Day     Distance can be substituted for time.    Example:   3 trips to mailbox (at road)   3 trips to corner of block   3 trips around the block  C. Go to local high school and use the track.    Walk for distance ____ around track  Or time ____ minutes  D. Walk ____ Jog  ____ Run ___   Why exercise?  So many benefits! Here are SOME of them: Heart health, including raising your good cholesterol level and reducing heart rate and blood pressure Lung health, including improved lung capacity It burns fats, and most of [place] can stand to be leaner, whether or not we are overweight. It increases the body's natural painkillers and mood elevators, so makes you feel better. Not only makes you feel better, but look better too Improves sleep Takes a bite out of stress May decrease your risk of many types of cancer If you are currently undergoing cancer treatment, exercise may improve your ability to tolerate treatments including chemotherapy. For everybody, it can improve your energy level. Those with cancer-related fatigue report a 40-50% reduction in this symptom when exercising regularly. If you are a survivor of breast, colon, or prostate cancer, it may decrease your risk of a recurrence. (This may hold for other cancers too, but so far we have data just for these three types.)  How to exercise: Get your doctor's okay. Pick something you enjoy doing, like walking, Zumba, biking, swimming, or whatever. Start at low intensity and time, then gradually increase.  (See walking program handout.) Set a goal to achieve over time.  The American Cancer Society, American Heart Association, and U.S. Dept. of Health and Human Services recommend 150 minutes of moderate exercise, 75 minutes of vigorous exercise, or a combination of both per week. This should be done in episodes at least 10 minutes long, spread throughout the week.  Need help being motivated? Pick something you enjoy doing, because you'll be more inclined to stick with that activity than something that feels like a chore. Do it with a friend so that you are accountable to each other. Schedule it into your day. Place it on your calendar and keep that appointment just like you do any appointment that you make. Join  an exercise group that meets at a specific time.  That way, you have to show up on time, and that makes it harder to procrastinate about doing your workout.  It also keeps you accountable--people begin to expect you to be there. Join a gym where you feel comfortable and not intimidated, at the right cost. Sign up for something that you'll need to be in shape for on a specific date, like  a 1K or a 5K to walk or run, a 20 or 30 mile bike ride, a mud run or something like that. If the date is looming, you know you'll need to train to be ready for it.  An added benefit is that many of these are fundraisers for good causes. If you've already paid for a gym membership, group exercise class or event, you might as well work out, so you haven't wasted your money!    Milagros Loll Roy, PT 05/27/2023, 12:37 PM

## 2023-05-27 NOTE — Patient Instructions (Signed)
  SWALLOWING EXERCISES Do these until 6 months after your last day of radiation, then 2-3 times per week afterwards You can use a wet spoon to swallow something, if your mouth gets dry  Effortful Swallows - Press your tongue against the roof of your mouth for 3 seconds, then swallow your saliva as hard as you can  - Do at least 20 reps/day, in sets of 5-10  Masako Swallow - swallow with your tongue sticking out - Stick tongue out past your lips and gently bite tongue with your teeth - Swallow, while holding your tongue with your teeth - Do at least 20 reps/day, in sets of 5-10  Shaker Exercise - head lift -  Lie flat on your back in your bed, the floor, or a couch - Raise your head and look at your feet - KEEP YOUR SHOULDERS DOWN - HOLD FOR 45-60 SECONDS, then lower your head back down - Repeat 3 times, 2-3 times a day  [employer] - "squeeze swallow" exercise - Swallow, and squeeze tight to keep your Adam's Apple up - Hold the squeeze for 5-7 seconds - then relax - Do at least 20 reps/day, in sets of 5-10

## 2023-05-27 NOTE — Therapy (Signed)
OUTPATIENT SPEECH LANGUAGE PATHOLOGY ONCOLOGY EVALUATION   Patient Name: Peter [name] MRN: [id number] DOB:Oct 17, 1965, 57 y.o., male Today's Date: 05/27/2023  PCP: Cheryll Cockayne, MD REFERRING PROVIDER: Lonie Peak, MD  END OF SESSION:  End of Session - 05/27/23 1118     Visit Number 1    Number of Visits 7    Date for SLP Re-Evaluation 08/25/23    SLP Start Time 1112    SLP Stop Time  1150    SLP Time Calculation (min) 38 min    Activity Tolerance Patient tolerated treatment well             Past Medical History:  Diagnosis Date   COVID 04/04/2021   states mild sx   Degenerative disc disease, lumbar    Dr. Shelle Iron   Dyslipidemia    GERD (gastroesophageal reflux disease)    Helicobacter pylori antibody positive 02/21/2005   treated with antibiotics   Past Surgical History:  Procedure Laterality Date   COLONOSCOPY  2013   normal   ESOPHAGOGASTRODUODENOSCOPY  2007   Dr. 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 Hx: He presented to Dr. Clovis Cao at [facility] on 01/28/23 with  a left sided neck lump X 1 week. Physical exam revealed a 1 cm left level II somewhat mobile mass. No other palpable masses or enlarged were noted in the neck. 02/03/23 CT neck demonstrated a 3.1 cystic/necrotic left level IIA lymph node suspicious for metastatic disease. No definite primary was identified in the neck. 02/18/23 FNA collected by Dr. Jenne Pane showed findings consistent with atypical dyskeratotic squamous cells suspicious for malignancy; p16 negative. Given the patient's age and presence of dyskeratotic cells, the features were noted as suspicious for squamous cell carcinoma. Of note: clear yellow fluid was aspirated from the left neck mass during FNA. 03/11/23 core biopsy of left cervical node showed findings consistent with metastatic SCC, p 16 +. 03/18/23 PET demonstrated hypermetabolism associated with the left level IIA cervical lymph node, consistent with metastatic squamous cell carcinoma. PET otherwise showed no primary head and neck malignancy identified, and no evidence of metastatic disease in the chest, abdomen, or pelvis. Symmetric hypermetabolic activity was appreciated in both tonsillar regions, however this is likely physiologic or inflammatory in etiology. 03/26/23 Consult with Dr. Lou Cal, Laryngoscopy unremarkable. She recommended another biopsy under anesthesia. 04/05/23 Biopsies of throat (tonsil, base of tongue, epiglottis) all negative.  04/06/23 Consult with Dr. Basilio Cairo, 04/15/23 Consult with Dr. Truett Perna. He will receive radiation only. Treatment plan:  He will receive 35 fractions of radiation to his Oropharynx and bilateral neck.Treatment started 9/19 and will complete 11/6  PAIN:  Are you having pain?  No  FALLS: Has patient fallen in last 6 months?  No  LIVING ENVIRONMENT: Lives with: lives with their spouse Lives in: House/apartment  PLOF:  Level of assistance: Independent with ADLs, Independent with IADLs Employment: Self-employed  PATIENT GOALS: Maintain WNL  swallowing  OBJECTIVE:  Note: Objective measures were completed at Evaluation unless otherwise noted.   COGNITION: Overall cognitive status: Within functional limits for tasks assessed  LANGUAGE: Receptive and Expressive language appeared WNL.  ORAL MOTOR EXAMINATION: Overall status: WFL   MOTOR SPEECH: Overall motor speech: Appears intact   CLINICAL SWALLOW ASSESSMENT:   Current diet: regular and thin liquids Dentition: adequate natural dentition Patient directly observed with POs: Yes: dysphagia 3 (soft) and thin liquids  Feeding: able to feed self Liquids provided by: cup Oral phase signs and symptoms:  none noted Pharyngeal phase signs and symptoms:  none noted  TODAY'S TREATMENT:                                                                                                                                         DATE:   05/27/23 (eval): Research states the risk for dysphagia increases due to radiation and/or chemotherapy treatment due to a variety of factors, so SLP educated the pt about the possibility of reduced/limited ability for PO intake during rad tx. SLP also educated pt regarding possible changes to swallowing musculature after rad tx, and why adherence to dysphagia HEP provided today and PO consumption was necessary to inhibit muscle fibrosis following rad tx and to mitigate muscle disuse atrophy. SLP informed pt why this would be detrimental to their swallowing status and to their pulmonary health. Pt demonstrated understanding of these things to SLP. SLP encouraged pt to safely eat and drink as deep into their radiation/chemotherapy as possible to provide the best possible long-term swallowing outcome for pt.  SLP then developed an individualized HEP for pt involving oral and pharyngeal strengthening and ROM and pt was instructed how to perform these exercises, including SLP demonstration. After SLP demonstration, pt return demonstrated each exercise. SLP ensured pt  performance was correct prior to educating pt on next exercise. Pt required min cues faded to modified independent to perform HEP. Pt was instructed to complete this program 6-7 days/week, at least 2 times a day until 6 months after his or her last day of rad tx, and then x2 a week after that, indefinitely. Among other modifications for days when pt cannot functionally swallow, SLP also suggested pt to perform only non-swallowing tasks on the handout/HEP, and if necessary to cycle through the swallowing portion so the full program of exercises can be completed instead of fatiguing on one of the swallowing exercises and being unable to perform the other swallowing exercises. SLP instructed that swallowing exercises should then be added back into the regimen as pt is able to do so. Secondly, pt was told that former patients have  told SLP that during their course of radiation therapy, taking prescribed pain medication just prior to performing HEP (and eating/drinking) has proven helpful in completing HEP (and eating and drinking) more regularly when going through their course of radiation treatment.    PATIENT EDUCATION: Education details: late effects head/neck radiation on swallow function, HEP procedure, and modification to HEP when difficulty experienced with swallowing during and after radiation course Person educated: Patient and Spouse Education method: Explanation, Demonstration, Verbal cues, and Handouts Education comprehension: verbalized understanding, returned demonstration, verbal cues required, and needs further education   ASSESSMENT:  CLINICAL IMPRESSION: Patient is a 57 y.o. M who was seen today for assessment of swallowing as they undergo radiation/chemoradiation therapy. Today pt ate items from Dys III and drank thin liquids without overt s/s oral or pharyngeal difficulty. At this time pt swallowing is deemed WNL/WFL with these POs. No oral or overt s/sx pharyngeal deficits, including  aspiration were observed. There are no overt s/s aspiration PNA observed by SLP nor any reported by pt at this time. Data indicate that pt's swallow ability will likely decrease over the course of radiation/chemoradiation therapy and could very well decline over time following the conclusion of that therapy due to muscle disuse atrophy and/or muscle fibrosis. Pt will cont to need to be seen by SLP in order to assess safety of PO intake, assess the need for recommending any objective swallow assessment, and ensuring pt is correctly completing the individualized HEP.  OBJECTIVE IMPAIRMENTS: include voice disorder and dysphagia. These impairments are limiting patient from effectively communicating at home and in community and safety when swallowing. Factors affecting potential to achieve goals and functional outcome are  none noted . Patient will benefit from skilled SLP services to address above impairments and improve overall function.   REHAB POTENTIAL: Good   GOALS: Goals reviewed with patient? No   SHORT TERM GOALS: Target: 3rd total session   1. Pt will compelte HEP with modified independence in 2 sessions Baseline: Goal status: INITIAL   2.  pt will tell SLP why pt is completing HEP with modified independence Baseline:  Goal status: INITIAL   3.  pt will describe 3 overt s/s aspiration PNA with modified independence Baseline:  Goal status: INITIAL   4.  pt will tell SLP how a food journal could hasten return to a more normalized diet Baseline:  Goal status: INITIAL     LONG TERM GOALS: Target: 7th total session   1.  pt will complete HEP with independence over two visits Baseline:  Goal status: INITIAL   2.  pt will describe how to modify HEP over time, and the timeline associated with reduction in HEP frequency with modified independence over two sessions Baseline:  Goal status: INITIAL   PLAN:   SLP FREQUENCY:  once approx every 4 weeks   SLP DURATION:  7 sessions    PLANNED INTERVENTIONS: Aspiration precaution training, Pharyngeal strengthening exercises, Diet toleration management , Trials of upgraded texture/liquids, SLP instruction and feedback, Compensatory strategies, and Patient/family education     [facility], CCC-SLP 05/27/2023, 2:00 PM

## 2023-05-28 ENCOUNTER — Other Ambulatory Visit: Payer: Self-pay

## 2023-05-28 ENCOUNTER — Ambulatory Visit
Admission: RE | Admit: 2023-05-28 | Discharge: 2023-05-28 | Disposition: A | Discharge status: Home / Self Care | Payer: BC Managed Care – PPO | Source: Ambulatory Visit | Attending: Radiation Oncology | Admitting: Radiation Oncology

## 2023-05-28 ENCOUNTER — Ambulatory Visit: Payer: BC Managed Care – PPO

## 2023-05-28 DIAGNOSIS — Z510 Encounter for antineoplastic radiation therapy: Secondary | ICD-10-CM | POA: Diagnosis not present

## 2023-05-28 DIAGNOSIS — C770 Secondary and unspecified malignant neoplasm of lymph nodes of head, face and neck: Secondary | ICD-10-CM | POA: Diagnosis not present

## 2023-05-28 DIAGNOSIS — C801 Malignant (primary) neoplasm, unspecified: Secondary | ICD-10-CM | POA: Diagnosis not present

## 2023-05-28 LAB — RAD ONC ARIA SESSION SUMMARY
Course Elapsed Days: 15
Plan Fractions Treated to Date: 12
Plan Prescribed Dose Per Fraction: 2 Gy
Plan Total Fractions Prescribed: 35
Plan Total Prescribed Dose: 70 Gy
Reference Point Dosage Given to Date: 24 Gy
Reference Point Session Dosage Given: 2 Gy
Session Number: 12

## 2023-05-28 NOTE — Progress Notes (Signed)
Oncology Nurse Navigator Documentation   I met with Mr. Vossler and his wife during head and neck MDC yesterday. He is doing well and tolerating treatment at this time. He knows to call me if he has any concerns or questions as he proceeds with his cancer treatment.   Hedda Slade RN, BSN, OCN Head & Neck Oncology Nurse Navigator [facility] at [facility] Phone # [phone]  Fax # [phone]

## 2023-05-31 ENCOUNTER — Other Ambulatory Visit: Payer: Self-pay | Admitting: Radiation Oncology

## 2023-05-31 ENCOUNTER — Ambulatory Visit: Payer: BC Managed Care – PPO

## 2023-05-31 ENCOUNTER — Other Ambulatory Visit: Payer: Self-pay

## 2023-05-31 ENCOUNTER — Ambulatory Visit
Admission: RE | Admit: 2023-05-31 | Discharge: 2023-05-31 | Disposition: A | Discharge status: Home / Self Care | Payer: BC Managed Care – PPO | Source: Ambulatory Visit | Attending: Radiation Oncology

## 2023-05-31 ENCOUNTER — Telehealth: Payer: Self-pay

## 2023-05-31 DIAGNOSIS — C801 Malignant (primary) neoplasm, unspecified: Secondary | ICD-10-CM | POA: Diagnosis not present

## 2023-05-31 DIAGNOSIS — C770 Secondary and unspecified malignant neoplasm of lymph nodes of head, face and neck: Secondary | ICD-10-CM

## 2023-05-31 DIAGNOSIS — Z510 Encounter for antineoplastic radiation therapy: Secondary | ICD-10-CM | POA: Diagnosis not present

## 2023-05-31 LAB — RAD ONC ARIA SESSION SUMMARY
Course Elapsed Days: 18
Plan Fractions Treated to Date: 13
Plan Prescribed Dose Per Fraction: 2 Gy
Plan Total Fractions Prescribed: 35
Plan Total Prescribed Dose: 70 Gy
Reference Point Dosage Given to Date: 26 Gy
Reference Point Session Dosage Given: 2 Gy
Session Number: 13

## 2023-05-31 NOTE — Telephone Encounter (Signed)
RN left message for pt to let him know about the delay in Hycet being sent in for him due to a technical delay in our system. Rn will call back pt once this medication has been sent to pt pharmacy.

## 2023-06-01 ENCOUNTER — Telehealth: Payer: Self-pay

## 2023-06-01 ENCOUNTER — Other Ambulatory Visit: Payer: Self-pay | Admitting: Radiology

## 2023-06-01 ENCOUNTER — Other Ambulatory Visit: Payer: Self-pay | Admitting: Radiation Oncology

## 2023-06-01 ENCOUNTER — Inpatient Hospital Stay: Payer: BC Managed Care – PPO | Admitting: Nutrition

## 2023-06-01 ENCOUNTER — Ambulatory Visit: Payer: BC Managed Care – PPO

## 2023-06-01 ENCOUNTER — Ambulatory Visit
Admission: RE | Admit: 2023-06-01 | Discharge: 2023-06-01 | Disposition: A | Discharge status: Home / Self Care | Payer: BC Managed Care – PPO | Source: Ambulatory Visit | Attending: Radiation Oncology | Admitting: Radiation Oncology

## 2023-06-01 ENCOUNTER — Other Ambulatory Visit: Payer: Self-pay

## 2023-06-01 DIAGNOSIS — C760 Malignant neoplasm of head, face and neck: Secondary | ICD-10-CM

## 2023-06-01 DIAGNOSIS — Z510 Encounter for antineoplastic radiation therapy: Secondary | ICD-10-CM | POA: Diagnosis not present

## 2023-06-01 DIAGNOSIS — C770 Secondary and unspecified malignant neoplasm of lymph nodes of head, face and neck: Secondary | ICD-10-CM

## 2023-06-01 DIAGNOSIS — C801 Malignant (primary) neoplasm, unspecified: Secondary | ICD-10-CM | POA: Diagnosis not present

## 2023-06-01 LAB — RAD ONC ARIA SESSION SUMMARY
Course Elapsed Days: 19
Plan Fractions Treated to Date: 14
Plan Prescribed Dose Per Fraction: 2 Gy
Plan Total Fractions Prescribed: 35
Plan Total Prescribed Dose: 70 Gy
Reference Point Dosage Given to Date: 28 Gy
Reference Point Session Dosage Given: 2 Gy
Session Number: 14

## 2023-06-01 MED ORDER — HYDROCODONE-ACETAMINOPHEN 5-325 MG PO TABS
1.0000 | ORAL_TABLET | ORAL | 0 refills | Status: DC | PRN
Start: 2023-06-01 — End: 2023-06-14

## 2023-06-01 MED ORDER — HYDROCODONE-ACETAMINOPHEN 7.5-325 MG/15ML PO SOLN
15.0000 mL | ORAL | 0 refills | Status: DC | PRN
Start: 2023-06-01 — End: 2023-06-01

## 2023-06-01 NOTE — Telephone Encounter (Signed)
Rn called pt to let him know that his pain medication had been sent in to his pharmacy in [place]. Pt was grateful for the call.

## 2023-06-01 NOTE — Progress Notes (Signed)
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  He is also status post bilateral tonsillectomy on August 12.  He will receive 35 fractions.  Last radiation therapy is scheduled for November 6.  Patient weighed 166.4 pounds in RD office today.  This is increased from 163.4 pounds on September 30.  Patient reports mouth sores and dysphagia.  He is using baking soda and salt water gargles frequently.  He drinks approximately 84 ounces of fluids.  He was tolerating eggs up until yesterday.  He is still trying to eat regular foods but acidic foods cause mouth pain.  He had some difficulty swallowing a pill.  He has been drinking 2 cartons of boost plus daily.  Reports he has very little taste.  Nutrition diagnosis: Predicted sub-optimal energy intake continues.  Intervention: Educated to continue small frequent meals and snacks with higher calorie, higher protein foods. Provided samples of boost very high-calorie and [employer] 1.4.  Please have more calories and protein than boost plus.  Educated on Recruitment consultant. Reviewed strategies for adding moisture to foods.  Suggested patient try blenderizing foods and adding gravies and sauces as tolerated. Provided high-calorie high-protein recipes. Provided nutrition fact sheet on taste alterations. Pain medication per MD. Contact information given.  Monitoring, evaluation, goals: Patient will tolerate adequate calories and protein to minimize weight loss throughout treatment.  Next visit: Tuesday, October 15 after radiation therapy.  **Disclaimer: This note was dictated with voice recognition software. Similar sounding words can inadvertently be transcribed and this note may contain transcription errors which may not have been corrected upon publication of note.**

## 2023-06-01 NOTE — Telephone Encounter (Signed)
RN left message for pt concerning his pain medication. Rn had received a fax from the pharmacy that his insurance did cover the liquid pain medication. Dr. Basilio Cairo was informed and hydrocodone pills were sent to the pharmacy for this pt. RN left message with pt about this medication. Rn left call back details for pt if he had questions.

## 2023-06-02 ENCOUNTER — Ambulatory Visit: Payer: BC Managed Care – PPO

## 2023-06-02 ENCOUNTER — Ambulatory Visit
Admission: RE | Admit: 2023-06-02 | Discharge: 2023-06-02 | Disposition: A | Discharge status: Home / Self Care | Payer: BC Managed Care – PPO | Source: Ambulatory Visit | Attending: Radiation Oncology | Admitting: Radiation Oncology

## 2023-06-02 ENCOUNTER — Other Ambulatory Visit: Payer: Self-pay

## 2023-06-02 DIAGNOSIS — Z510 Encounter for antineoplastic radiation therapy: Secondary | ICD-10-CM | POA: Diagnosis not present

## 2023-06-02 DIAGNOSIS — C770 Secondary and unspecified malignant neoplasm of lymph nodes of head, face and neck: Secondary | ICD-10-CM | POA: Diagnosis not present

## 2023-06-02 DIAGNOSIS — C801 Malignant (primary) neoplasm, unspecified: Secondary | ICD-10-CM | POA: Diagnosis not present

## 2023-06-02 LAB — RAD ONC ARIA SESSION SUMMARY
Course Elapsed Days: 20
Plan Fractions Treated to Date: 1
Plan Prescribed Dose Per Fraction: 2 Gy
Plan Total Fractions Prescribed: 21
Plan Total Prescribed Dose: 42 Gy
Reference Point Dosage Given to Date: 30 Gy
Reference Point Session Dosage Given: 2 Gy
Session Number: 15

## 2023-06-03 ENCOUNTER — Ambulatory Visit: Payer: BC Managed Care – PPO

## 2023-06-03 ENCOUNTER — Other Ambulatory Visit: Payer: Self-pay

## 2023-06-03 ENCOUNTER — Ambulatory Visit
Admission: RE | Admit: 2023-06-03 | Discharge: 2023-06-03 | Disposition: A | Discharge status: Home / Self Care | Payer: BC Managed Care – PPO | Source: Ambulatory Visit | Attending: Radiation Oncology | Admitting: Radiation Oncology

## 2023-06-03 DIAGNOSIS — Z510 Encounter for antineoplastic radiation therapy: Secondary | ICD-10-CM | POA: Diagnosis not present

## 2023-06-03 DIAGNOSIS — C801 Malignant (primary) neoplasm, unspecified: Secondary | ICD-10-CM | POA: Diagnosis not present

## 2023-06-03 DIAGNOSIS — C770 Secondary and unspecified malignant neoplasm of lymph nodes of head, face and neck: Secondary | ICD-10-CM | POA: Diagnosis not present

## 2023-06-03 LAB — RAD ONC ARIA SESSION SUMMARY
Course Elapsed Days: 21
Plan Fractions Treated to Date: 2
Plan Prescribed Dose Per Fraction: 2 Gy
Plan Total Fractions Prescribed: 21
Plan Total Prescribed Dose: 42 Gy
Reference Point Dosage Given to Date: 32 Gy
Reference Point Session Dosage Given: 2 Gy
Session Number: 16

## 2023-06-04 ENCOUNTER — Ambulatory Visit: Payer: BC Managed Care – PPO

## 2023-06-04 ENCOUNTER — Ambulatory Visit
Admission: RE | Admit: 2023-06-04 | Discharge: 2023-06-04 | Disposition: A | Discharge status: Home / Self Care | Payer: BC Managed Care – PPO | Source: Ambulatory Visit | Attending: Radiation Oncology | Admitting: Radiation Oncology

## 2023-06-04 ENCOUNTER — Other Ambulatory Visit: Payer: Self-pay

## 2023-06-04 DIAGNOSIS — C770 Secondary and unspecified malignant neoplasm of lymph nodes of head, face and neck: Secondary | ICD-10-CM | POA: Diagnosis not present

## 2023-06-04 DIAGNOSIS — Z510 Encounter for antineoplastic radiation therapy: Secondary | ICD-10-CM | POA: Diagnosis not present

## 2023-06-04 DIAGNOSIS — C801 Malignant (primary) neoplasm, unspecified: Secondary | ICD-10-CM | POA: Diagnosis not present

## 2023-06-04 LAB — RAD ONC ARIA SESSION SUMMARY
Course Elapsed Days: 22
Plan Fractions Treated to Date: 3
Plan Prescribed Dose Per Fraction: 2 Gy
Plan Total Fractions Prescribed: 21
Plan Total Prescribed Dose: 42 Gy
Reference Point Dosage Given to Date: 34 Gy
Reference Point Session Dosage Given: 2 Gy
Session Number: 17

## 2023-06-07 ENCOUNTER — Ambulatory Visit: Payer: BC Managed Care – PPO

## 2023-06-07 ENCOUNTER — Other Ambulatory Visit: Payer: Self-pay

## 2023-06-07 ENCOUNTER — Ambulatory Visit
Admission: RE | Admit: 2023-06-07 | Discharge: 2023-06-07 | Disposition: A | Discharge status: Home / Self Care | Payer: BC Managed Care – PPO | Source: Ambulatory Visit | Attending: Radiation Oncology | Admitting: Radiation Oncology

## 2023-06-07 ENCOUNTER — Ambulatory Visit
Admission: RE | Admit: 2023-06-07 | Discharge: 2023-06-07 | Disposition: A | Discharge status: Home / Self Care | Payer: BC Managed Care – PPO | Source: Ambulatory Visit | Attending: Radiation Oncology

## 2023-06-07 DIAGNOSIS — C770 Secondary and unspecified malignant neoplasm of lymph nodes of head, face and neck: Secondary | ICD-10-CM | POA: Diagnosis not present

## 2023-06-07 DIAGNOSIS — Z510 Encounter for antineoplastic radiation therapy: Secondary | ICD-10-CM | POA: Diagnosis not present

## 2023-06-07 DIAGNOSIS — C801 Malignant (primary) neoplasm, unspecified: Secondary | ICD-10-CM | POA: Diagnosis not present

## 2023-06-07 LAB — RAD ONC ARIA SESSION SUMMARY
Course Elapsed Days: 25
Plan Fractions Treated to Date: 4
Plan Prescribed Dose Per Fraction: 2 Gy
Plan Total Fractions Prescribed: 21
Plan Total Prescribed Dose: 42 Gy
Reference Point Dosage Given to Date: 36 Gy
Reference Point Session Dosage Given: 2 Gy
Session Number: 18

## 2023-06-08 ENCOUNTER — Ambulatory Visit: Payer: BC Managed Care – PPO

## 2023-06-08 ENCOUNTER — Ambulatory Visit
Admission: RE | Admit: 2023-06-08 | Discharge: 2023-06-08 | Disposition: A | Discharge status: Home / Self Care | Payer: BC Managed Care – PPO | Source: Ambulatory Visit | Attending: Radiation Oncology

## 2023-06-08 ENCOUNTER — Inpatient Hospital Stay: Payer: BC Managed Care – PPO | Admitting: Nutrition

## 2023-06-08 ENCOUNTER — Other Ambulatory Visit: Payer: Self-pay

## 2023-06-08 DIAGNOSIS — Z510 Encounter for antineoplastic radiation therapy: Secondary | ICD-10-CM | POA: Diagnosis not present

## 2023-06-08 DIAGNOSIS — C770 Secondary and unspecified malignant neoplasm of lymph nodes of head, face and neck: Secondary | ICD-10-CM | POA: Diagnosis not present

## 2023-06-08 DIAGNOSIS — C801 Malignant (primary) neoplasm, unspecified: Secondary | ICD-10-CM | POA: Diagnosis not present

## 2023-06-08 LAB — RAD ONC ARIA SESSION SUMMARY
Course Elapsed Days: 26
Plan Fractions Treated to Date: 5
Plan Prescribed Dose Per Fraction: 2 Gy
Plan Total Fractions Prescribed: 21
Plan Total Prescribed Dose: 42 Gy
Reference Point Dosage Given to Date: 38 Gy
Reference Point Session Dosage Given: 2 Gy
Session Number: 19

## 2023-06-08 NOTE — Progress Notes (Signed)
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  He is scheduled for 35 fractions of radiation therapy and his final treatment should be November 6.  Weight decreased and was documented as 164.2 pounds today down from 166.4 pounds last Tuesday.  This is a 2.2 pound weight loss over 1 week.  Reports pain significantly increased over this past weekend.  Pain medication added.  He has severe mouth sores and dysphagia.  He uses baking soda and salt water gargles frequently.  He is drinking a lot of water which helps him to swallow foods.  Drinks a combination of boost plus and boost VHC.  He has averaged 2 cartons per week.  He has started to increase boost VHC 3 times daily.  He tolerated 3 boiled eggs with soy milk at breakfast along with 2-16 ounce bottles of water.  States this took him 1 hour to consume.  He is willing to try blenderized foods.  Nutrition diagnosis: Predicted sub-optimal energy intake has evolved into inadequate oral intake related to cancer and associated treatments as evidenced by 24-hour recall and 2.2 pound weight loss over 1 week.  Intervention: Educated to increase boost VHC to 4 cartons a day.  Thin down with milk or water to ease swallowing. Try pureing nonirritating foods and adding gravies and sauces. Tips provided on how to add extra calories.  Monitoring, evaluation, goals: Patient will tolerate increased calories and protein to minimize further weight loss.  Next visit: Tuesday, October 22 after radiation treatments.  **Disclaimer: This note was dictated with voice recognition software. Similar sounding words can inadvertently be transcribed and this note may contain transcription errors which may not have been corrected upon publication of note.**

## 2023-06-09 ENCOUNTER — Ambulatory Visit: Payer: BC Managed Care – PPO

## 2023-06-09 ENCOUNTER — Ambulatory Visit
Admission: RE | Admit: 2023-06-09 | Discharge: 2023-06-09 | Disposition: A | Discharge status: Home / Self Care | Payer: BC Managed Care – PPO | Source: Ambulatory Visit | Attending: Radiation Oncology

## 2023-06-09 ENCOUNTER — Other Ambulatory Visit: Payer: Self-pay

## 2023-06-09 DIAGNOSIS — C801 Malignant (primary) neoplasm, unspecified: Secondary | ICD-10-CM | POA: Diagnosis not present

## 2023-06-09 DIAGNOSIS — Z510 Encounter for antineoplastic radiation therapy: Secondary | ICD-10-CM | POA: Diagnosis not present

## 2023-06-09 DIAGNOSIS — C770 Secondary and unspecified malignant neoplasm of lymph nodes of head, face and neck: Secondary | ICD-10-CM | POA: Diagnosis not present

## 2023-06-09 LAB — RAD ONC ARIA SESSION SUMMARY
Course Elapsed Days: 27
Plan Fractions Treated to Date: 6
Plan Prescribed Dose Per Fraction: 2 Gy
Plan Total Fractions Prescribed: 21
Plan Total Prescribed Dose: 42 Gy
Reference Point Dosage Given to Date: 40 Gy
Reference Point Session Dosage Given: 2 Gy
Session Number: 20

## 2023-06-10 ENCOUNTER — Other Ambulatory Visit: Payer: Self-pay

## 2023-06-10 ENCOUNTER — Ambulatory Visit
Admission: RE | Admit: 2023-06-10 | Discharge: 2023-06-10 | Disposition: A | Discharge status: Home / Self Care | Payer: BC Managed Care – PPO | Source: Ambulatory Visit | Attending: Radiation Oncology | Admitting: Radiation Oncology

## 2023-06-10 ENCOUNTER — Ambulatory Visit: Payer: BC Managed Care – PPO

## 2023-06-10 DIAGNOSIS — C801 Malignant (primary) neoplasm, unspecified: Secondary | ICD-10-CM | POA: Diagnosis not present

## 2023-06-10 DIAGNOSIS — Z510 Encounter for antineoplastic radiation therapy: Secondary | ICD-10-CM | POA: Diagnosis not present

## 2023-06-10 DIAGNOSIS — C770 Secondary and unspecified malignant neoplasm of lymph nodes of head, face and neck: Secondary | ICD-10-CM | POA: Diagnosis not present

## 2023-06-10 LAB — RAD ONC ARIA SESSION SUMMARY
Course Elapsed Days: 28
Plan Fractions Treated to Date: 7
Plan Prescribed Dose Per Fraction: 2 Gy
Plan Total Fractions Prescribed: 21
Plan Total Prescribed Dose: 42 Gy
Reference Point Dosage Given to Date: 42 Gy
Reference Point Session Dosage Given: 2 Gy
Session Number: 21

## 2023-06-11 ENCOUNTER — Ambulatory Visit: Payer: BC Managed Care – PPO

## 2023-06-11 ENCOUNTER — Other Ambulatory Visit: Payer: Self-pay

## 2023-06-11 ENCOUNTER — Ambulatory Visit
Admission: RE | Admit: 2023-06-11 | Discharge: 2023-06-11 | Disposition: A | Discharge status: Home / Self Care | Payer: BC Managed Care – PPO | Source: Ambulatory Visit | Attending: Radiation Oncology | Admitting: Radiation Oncology

## 2023-06-11 DIAGNOSIS — C770 Secondary and unspecified malignant neoplasm of lymph nodes of head, face and neck: Secondary | ICD-10-CM | POA: Diagnosis not present

## 2023-06-11 DIAGNOSIS — Z510 Encounter for antineoplastic radiation therapy: Secondary | ICD-10-CM | POA: Diagnosis not present

## 2023-06-11 DIAGNOSIS — C801 Malignant (primary) neoplasm, unspecified: Secondary | ICD-10-CM | POA: Diagnosis not present

## 2023-06-11 LAB — RAD ONC ARIA SESSION SUMMARY
Course Elapsed Days: 29
Plan Fractions Treated to Date: 8
Plan Prescribed Dose Per Fraction: 2 Gy
Plan Total Fractions Prescribed: 21
Plan Total Prescribed Dose: 42 Gy
Reference Point Dosage Given to Date: 44 Gy
Reference Point Session Dosage Given: 2 Gy
Session Number: 22

## 2023-06-14 ENCOUNTER — Other Ambulatory Visit: Payer: Self-pay | Admitting: Radiation Oncology

## 2023-06-14 ENCOUNTER — Ambulatory Visit
Admission: RE | Admit: 2023-06-14 | Discharge: 2023-06-14 | Disposition: A | Discharge status: Home / Self Care | Payer: BC Managed Care – PPO | Source: Ambulatory Visit | Attending: Radiation Oncology

## 2023-06-14 ENCOUNTER — Other Ambulatory Visit: Payer: Self-pay

## 2023-06-14 ENCOUNTER — Ambulatory Visit
Admission: RE | Admit: 2023-06-14 | Discharge: 2023-06-14 | Disposition: A | Discharge status: Home / Self Care | Payer: BC Managed Care – PPO | Source: Ambulatory Visit | Attending: Radiation Oncology | Admitting: Radiation Oncology

## 2023-06-14 ENCOUNTER — Ambulatory Visit: Payer: BC Managed Care – PPO

## 2023-06-14 DIAGNOSIS — Z510 Encounter for antineoplastic radiation therapy: Secondary | ICD-10-CM | POA: Diagnosis not present

## 2023-06-14 DIAGNOSIS — C770 Secondary and unspecified malignant neoplasm of lymph nodes of head, face and neck: Secondary | ICD-10-CM | POA: Diagnosis not present

## 2023-06-14 DIAGNOSIS — C801 Malignant (primary) neoplasm, unspecified: Secondary | ICD-10-CM | POA: Diagnosis not present

## 2023-06-14 LAB — RAD ONC ARIA SESSION SUMMARY
Course Elapsed Days: 32
Plan Fractions Treated to Date: 9
Plan Prescribed Dose Per Fraction: 2 Gy
Plan Total Fractions Prescribed: 21
Plan Total Prescribed Dose: 42 Gy
Reference Point Dosage Given to Date: 46 Gy
Reference Point Session Dosage Given: 2 Gy
Session Number: 23

## 2023-06-14 MED ORDER — HYDROCODONE-ACETAMINOPHEN 5-325 MG PO TABS: 1.0000 | ORAL_TABLET | ORAL | 0 refills | Status: DC | PRN

## 2023-06-15 ENCOUNTER — Inpatient Hospital Stay: Payer: BC Managed Care – PPO | Admitting: Nutrition

## 2023-06-15 ENCOUNTER — Other Ambulatory Visit: Payer: Self-pay

## 2023-06-15 ENCOUNTER — Ambulatory Visit
Admission: RE | Admit: 2023-06-15 | Discharge: 2023-06-15 | Disposition: A | Discharge status: Home / Self Care | Payer: BC Managed Care – PPO | Source: Ambulatory Visit | Attending: Radiation Oncology

## 2023-06-15 ENCOUNTER — Ambulatory Visit: Payer: BC Managed Care – PPO

## 2023-06-15 DIAGNOSIS — C801 Malignant (primary) neoplasm, unspecified: Secondary | ICD-10-CM | POA: Diagnosis not present

## 2023-06-15 DIAGNOSIS — Z510 Encounter for antineoplastic radiation therapy: Secondary | ICD-10-CM | POA: Diagnosis not present

## 2023-06-15 DIAGNOSIS — C770 Secondary and unspecified malignant neoplasm of lymph nodes of head, face and neck: Secondary | ICD-10-CM | POA: Diagnosis not present

## 2023-06-15 LAB — RAD ONC ARIA SESSION SUMMARY
Course Elapsed Days: 33
Plan Fractions Treated to Date: 10
Plan Prescribed Dose Per Fraction: 2 Gy
Plan Total Fractions Prescribed: 21
Plan Total Prescribed Dose: 42 Gy
Reference Point Dosage Given to Date: 48 Gy
Reference Point Session Dosage Given: 2 Gy
Session Number: 24

## 2023-06-15 NOTE — Progress Notes (Signed)
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  He is receiving 35 fractions of radiation therapy with final treatment scheduled for November 6.  He does not have a feeding tube.  Weight decreased slightly to 163.8 pounds on October 21.  This is decreased from 164.2 pounds October 15.  Patient weighed 162.8 pounds September 23.  Patient reports pain is well-controlled at this time.  He continues to have some mouth sores and dysphagia.  He does not complain of thickened saliva but he does have dry mouth.  He is drinking 2 cartons of boost VHC and 2 cartons of original boost.  He has experimented with various foods and pureing this past week with some success.  Appears he tolerates oatmeal, eggs, chicken, potato soup, and pancakes.  He continues to drink increased fluids by mouth.  Nutrition diagnosis: Inadequate oral intake, continues.  Intervention: Increase boost VHC 3 times daily and 1 original boost daily.  Continue modifying thickness as needed. Continue pured foods and leave out spices as needed to tolerate.  Encouraged modifying textures for easier swallowing. Discussed additional ways to add calories and protein. Provided recipe booklet. If unable to tolerate pured foods, increase boost VHC to 5 cartons daily.  Monitoring, evaluation, goals: Patient will tolerate adequate calories and protein to minimize weight loss.  Next visit: Tuesday, October 29 after radiation therapy.  **Disclaimer: This note was dictated with voice recognition software. Similar sounding words can inadvertently be transcribed and this note may contain transcription errors which may not have been corrected upon publication of note.**

## 2023-06-16 ENCOUNTER — Other Ambulatory Visit: Payer: Self-pay

## 2023-06-16 ENCOUNTER — Ambulatory Visit: Payer: BC Managed Care – PPO

## 2023-06-16 ENCOUNTER — Ambulatory Visit
Admission: RE | Admit: 2023-06-16 | Discharge: 2023-06-16 | Disposition: A | Discharge status: Home / Self Care | Payer: BC Managed Care – PPO | Source: Ambulatory Visit | Attending: Radiation Oncology

## 2023-06-16 DIAGNOSIS — Z510 Encounter for antineoplastic radiation therapy: Secondary | ICD-10-CM | POA: Diagnosis not present

## 2023-06-16 DIAGNOSIS — C801 Malignant (primary) neoplasm, unspecified: Secondary | ICD-10-CM | POA: Diagnosis not present

## 2023-06-16 DIAGNOSIS — C770 Secondary and unspecified malignant neoplasm of lymph nodes of head, face and neck: Secondary | ICD-10-CM | POA: Diagnosis not present

## 2023-06-16 LAB — RAD ONC ARIA SESSION SUMMARY
Course Elapsed Days: 34
Plan Fractions Treated to Date: 11
Plan Prescribed Dose Per Fraction: 2 Gy
Plan Total Fractions Prescribed: 21
Plan Total Prescribed Dose: 42 Gy
Reference Point Dosage Given to Date: 50 Gy
Reference Point Session Dosage Given: 2 Gy
Session Number: 25

## 2023-06-17 ENCOUNTER — Other Ambulatory Visit: Payer: Self-pay

## 2023-06-17 ENCOUNTER — Ambulatory Visit
Admission: RE | Admit: 2023-06-17 | Discharge: 2023-06-17 | Disposition: A | Discharge status: Home / Self Care | Payer: BC Managed Care – PPO | Source: Ambulatory Visit | Attending: Radiation Oncology | Admitting: Radiation Oncology

## 2023-06-17 ENCOUNTER — Ambulatory Visit: Payer: BC Managed Care – PPO

## 2023-06-17 DIAGNOSIS — C801 Malignant (primary) neoplasm, unspecified: Secondary | ICD-10-CM | POA: Diagnosis not present

## 2023-06-17 DIAGNOSIS — Z510 Encounter for antineoplastic radiation therapy: Secondary | ICD-10-CM | POA: Diagnosis not present

## 2023-06-17 DIAGNOSIS — C770 Secondary and unspecified malignant neoplasm of lymph nodes of head, face and neck: Secondary | ICD-10-CM | POA: Diagnosis not present

## 2023-06-17 LAB — RAD ONC ARIA SESSION SUMMARY
Course Elapsed Days: 35
Plan Fractions Treated to Date: 12
Plan Prescribed Dose Per Fraction: 2 Gy
Plan Total Fractions Prescribed: 21
Plan Total Prescribed Dose: 42 Gy
Reference Point Dosage Given to Date: 52 Gy
Reference Point Session Dosage Given: 2 Gy
Session Number: 26

## 2023-06-18 ENCOUNTER — Ambulatory Visit
Admission: RE | Admit: 2023-06-18 | Discharge: 2023-06-18 | Disposition: A | Discharge status: Home / Self Care | Payer: BC Managed Care – PPO | Source: Ambulatory Visit | Attending: Radiation Oncology

## 2023-06-18 ENCOUNTER — Ambulatory Visit: Payer: BC Managed Care – PPO

## 2023-06-18 ENCOUNTER — Other Ambulatory Visit: Payer: Self-pay

## 2023-06-18 DIAGNOSIS — C770 Secondary and unspecified malignant neoplasm of lymph nodes of head, face and neck: Secondary | ICD-10-CM | POA: Diagnosis not present

## 2023-06-18 DIAGNOSIS — Z510 Encounter for antineoplastic radiation therapy: Secondary | ICD-10-CM | POA: Diagnosis not present

## 2023-06-18 DIAGNOSIS — C801 Malignant (primary) neoplasm, unspecified: Secondary | ICD-10-CM | POA: Diagnosis not present

## 2023-06-18 LAB — RAD ONC ARIA SESSION SUMMARY
Course Elapsed Days: 36
Plan Fractions Treated to Date: 13
Plan Prescribed Dose Per Fraction: 2 Gy
Plan Total Fractions Prescribed: 21
Plan Total Prescribed Dose: 42 Gy
Reference Point Dosage Given to Date: 54 Gy
Reference Point Session Dosage Given: 2 Gy
Session Number: 27

## 2023-06-21 ENCOUNTER — Ambulatory Visit
Admission: RE | Admit: 2023-06-21 | Discharge: 2023-06-21 | Disposition: A | Discharge status: Home / Self Care | Payer: BC Managed Care – PPO | Source: Ambulatory Visit | Attending: Radiation Oncology | Admitting: Radiation Oncology

## 2023-06-21 ENCOUNTER — Ambulatory Visit: Payer: BC Managed Care – PPO

## 2023-06-21 ENCOUNTER — Other Ambulatory Visit: Payer: Self-pay

## 2023-06-21 DIAGNOSIS — C770 Secondary and unspecified malignant neoplasm of lymph nodes of head, face and neck: Secondary | ICD-10-CM | POA: Diagnosis not present

## 2023-06-21 DIAGNOSIS — Z510 Encounter for antineoplastic radiation therapy: Secondary | ICD-10-CM | POA: Diagnosis not present

## 2023-06-21 DIAGNOSIS — C801 Malignant (primary) neoplasm, unspecified: Secondary | ICD-10-CM | POA: Diagnosis not present

## 2023-06-21 LAB — RAD ONC ARIA SESSION SUMMARY
Course Elapsed Days: 39
Plan Fractions Treated to Date: 14
Plan Prescribed Dose Per Fraction: 2 Gy
Plan Total Fractions Prescribed: 21
Plan Total Prescribed Dose: 42 Gy
Reference Point Dosage Given to Date: 56 Gy
Reference Point Session Dosage Given: 2 Gy
Session Number: 28

## 2023-06-22 ENCOUNTER — Ambulatory Visit: Payer: BC Managed Care – PPO

## 2023-06-22 ENCOUNTER — Other Ambulatory Visit: Payer: Self-pay

## 2023-06-22 ENCOUNTER — Inpatient Hospital Stay: Payer: BC Managed Care – PPO | Admitting: Nutrition

## 2023-06-22 ENCOUNTER — Ambulatory Visit
Admission: RE | Admit: 2023-06-22 | Discharge: 2023-06-22 | Disposition: A | Discharge status: Home / Self Care | Payer: BC Managed Care – PPO | Source: Ambulatory Visit | Attending: Radiation Oncology | Admitting: Radiation Oncology

## 2023-06-22 DIAGNOSIS — Z510 Encounter for antineoplastic radiation therapy: Secondary | ICD-10-CM | POA: Diagnosis not present

## 2023-06-22 DIAGNOSIS — C770 Secondary and unspecified malignant neoplasm of lymph nodes of head, face and neck: Secondary | ICD-10-CM | POA: Diagnosis not present

## 2023-06-22 DIAGNOSIS — C801 Malignant (primary) neoplasm, unspecified: Secondary | ICD-10-CM | POA: Diagnosis not present

## 2023-06-22 LAB — RAD ONC ARIA SESSION SUMMARY
Course Elapsed Days: 40
Plan Fractions Treated to Date: 15
Plan Prescribed Dose Per Fraction: 2 Gy
Plan Total Fractions Prescribed: 21
Plan Total Prescribed Dose: 42 Gy
Reference Point Dosage Given to Date: 58 Gy
Reference Point Session Dosage Given: 2 Gy
Session Number: 29

## 2023-06-22 NOTE — Progress Notes (Signed)
Nutrition follow-up completed with patient and wife after radiation therapy for nasopharyngeal cancer.  His final treatment is scheduled for November 6.  He does not have a feeding tube.  Weight stable and documented as 163.8 pounds on October 28.  He notes increased fatigue.  He has continued to consume some pured foods this past week but it is now harder to swallow.  He was able to force 3 eggs down this morning.  He is drinking a combination of 4-5 cartons of original boost and boost VHC.  He continues to have taste alterations making it very difficult to eat.  Patient is motivated to continue to maximize oral intake.  Nutrition diagnosis: Inadequate oral intake, stable.  Intervention: Consider increasing boost VHC to 4-5 cartons daily. Pured foods as tolerated. Continue increased fluids.  Monitoring, evaluation, goals: Patient will tolerate adequate calories and protein to minimize further weight loss.  Next visit: Tuesday, November 5 after radiation therapy.  **Disclaimer: This note was dictated with voice recognition software. Similar sounding words can inadvertently be transcribed and this note may contain transcription errors which may not have been corrected upon publication of note.**

## 2023-06-23 ENCOUNTER — Other Ambulatory Visit: Payer: Self-pay

## 2023-06-23 ENCOUNTER — Ambulatory Visit
Admission: RE | Admit: 2023-06-23 | Discharge: 2023-06-23 | Disposition: A | Discharge status: Home / Self Care | Payer: BC Managed Care – PPO | Source: Ambulatory Visit | Attending: Radiation Oncology

## 2023-06-23 ENCOUNTER — Ambulatory Visit: Payer: BC Managed Care – PPO

## 2023-06-23 DIAGNOSIS — Z510 Encounter for antineoplastic radiation therapy: Secondary | ICD-10-CM | POA: Diagnosis not present

## 2023-06-23 DIAGNOSIS — C770 Secondary and unspecified malignant neoplasm of lymph nodes of head, face and neck: Secondary | ICD-10-CM | POA: Diagnosis not present

## 2023-06-23 DIAGNOSIS — C801 Malignant (primary) neoplasm, unspecified: Secondary | ICD-10-CM | POA: Diagnosis not present

## 2023-06-23 LAB — RAD ONC ARIA SESSION SUMMARY
Course Elapsed Days: 41
Plan Fractions Treated to Date: 16
Plan Prescribed Dose Per Fraction: 2 Gy
Plan Total Fractions Prescribed: 21
Plan Total Prescribed Dose: 42 Gy
Reference Point Dosage Given to Date: 60 Gy
Reference Point Session Dosage Given: 2 Gy
Session Number: 30

## 2023-06-24 ENCOUNTER — Ambulatory Visit
Admission: RE | Admit: 2023-06-24 | Discharge: 2023-06-24 | Disposition: A | Discharge status: Home / Self Care | Payer: BC Managed Care – PPO | Source: Ambulatory Visit | Attending: Radiation Oncology | Admitting: Radiation Oncology

## 2023-06-24 ENCOUNTER — Other Ambulatory Visit: Payer: Self-pay

## 2023-06-24 ENCOUNTER — Ambulatory Visit: Payer: BC Managed Care – PPO

## 2023-06-24 DIAGNOSIS — C801 Malignant (primary) neoplasm, unspecified: Secondary | ICD-10-CM | POA: Diagnosis not present

## 2023-06-24 DIAGNOSIS — C770 Secondary and unspecified malignant neoplasm of lymph nodes of head, face and neck: Secondary | ICD-10-CM | POA: Diagnosis not present

## 2023-06-24 DIAGNOSIS — Z510 Encounter for antineoplastic radiation therapy: Secondary | ICD-10-CM | POA: Diagnosis not present

## 2023-06-24 LAB — RAD ONC ARIA SESSION SUMMARY
Course Elapsed Days: 42
Plan Fractions Treated to Date: 17
Plan Prescribed Dose Per Fraction: 2 Gy
Plan Total Fractions Prescribed: 21
Plan Total Prescribed Dose: 42 Gy
Reference Point Dosage Given to Date: 62 Gy
Reference Point Session Dosage Given: 2 Gy
Session Number: 31

## 2023-06-25 ENCOUNTER — Other Ambulatory Visit: Payer: Self-pay

## 2023-06-25 ENCOUNTER — Ambulatory Visit
Admission: RE | Admit: 2023-06-25 | Discharge: 2023-06-25 | Disposition: A | Discharge status: Home / Self Care | Payer: BC Managed Care – PPO | Source: Ambulatory Visit | Attending: Radiation Oncology | Admitting: Radiation Oncology

## 2023-06-25 ENCOUNTER — Ambulatory Visit: Payer: BC Managed Care – PPO

## 2023-06-25 DIAGNOSIS — Z510 Encounter for antineoplastic radiation therapy: Secondary | ICD-10-CM | POA: Insufficient documentation

## 2023-06-25 DIAGNOSIS — C770 Secondary and unspecified malignant neoplasm of lymph nodes of head, face and neck: Secondary | ICD-10-CM | POA: Insufficient documentation

## 2023-06-25 DIAGNOSIS — C801 Malignant (primary) neoplasm, unspecified: Secondary | ICD-10-CM | POA: Insufficient documentation

## 2023-06-25 LAB — RAD ONC ARIA SESSION SUMMARY
Course Elapsed Days: 43
Plan Fractions Treated to Date: 18
Plan Prescribed Dose Per Fraction: 2 Gy
Plan Total Fractions Prescribed: 21
Plan Total Prescribed Dose: 42 Gy
Reference Point Dosage Given to Date: 64 Gy
Reference Point Session Dosage Given: 2 Gy
Session Number: 32

## 2023-06-28 ENCOUNTER — Other Ambulatory Visit: Payer: Self-pay

## 2023-06-28 ENCOUNTER — Ambulatory Visit
Admission: RE | Admit: 2023-06-28 | Discharge: 2023-06-28 | Disposition: A | Discharge status: Home / Self Care | Payer: BC Managed Care – PPO | Source: Ambulatory Visit | Attending: Radiation Oncology

## 2023-06-28 ENCOUNTER — Other Ambulatory Visit: Payer: Self-pay | Admitting: Radiation Oncology

## 2023-06-28 ENCOUNTER — Ambulatory Visit: Payer: BC Managed Care – PPO

## 2023-06-28 DIAGNOSIS — Z510 Encounter for antineoplastic radiation therapy: Secondary | ICD-10-CM | POA: Diagnosis not present

## 2023-06-28 DIAGNOSIS — C770 Secondary and unspecified malignant neoplasm of lymph nodes of head, face and neck: Secondary | ICD-10-CM

## 2023-06-28 DIAGNOSIS — C801 Malignant (primary) neoplasm, unspecified: Secondary | ICD-10-CM | POA: Diagnosis not present

## 2023-06-28 LAB — RAD ONC ARIA SESSION SUMMARY
Course Elapsed Days: 46
Plan Fractions Treated to Date: 19
Plan Prescribed Dose Per Fraction: 2 Gy
Plan Total Fractions Prescribed: 21
Plan Total Prescribed Dose: 42 Gy
Reference Point Dosage Given to Date: 66 Gy
Reference Point Session Dosage Given: 2 Gy
Session Number: 33

## 2023-06-28 MED ORDER — HYDROCODONE-ACETAMINOPHEN 5-325 MG PO TABS: 1.0000 | ORAL_TABLET | ORAL | 0 refills | Status: DC | PRN

## 2023-06-29 ENCOUNTER — Other Ambulatory Visit: Payer: Self-pay

## 2023-06-29 ENCOUNTER — Ambulatory Visit
Admission: RE | Admit: 2023-06-29 | Discharge: 2023-06-29 | Disposition: A | Discharge status: Home / Self Care | Payer: BC Managed Care – PPO | Source: Ambulatory Visit | Attending: Radiation Oncology

## 2023-06-29 ENCOUNTER — Inpatient Hospital Stay: Payer: BC Managed Care – PPO | Attending: Oncology | Admitting: Nutrition

## 2023-06-29 DIAGNOSIS — C770 Secondary and unspecified malignant neoplasm of lymph nodes of head, face and neck: Secondary | ICD-10-CM | POA: Diagnosis not present

## 2023-06-29 DIAGNOSIS — Z510 Encounter for antineoplastic radiation therapy: Secondary | ICD-10-CM | POA: Diagnosis not present

## 2023-06-29 DIAGNOSIS — C801 Malignant (primary) neoplasm, unspecified: Secondary | ICD-10-CM | POA: Diagnosis not present

## 2023-06-29 LAB — RAD ONC ARIA SESSION SUMMARY
Course Elapsed Days: 47
Plan Fractions Treated to Date: 20
Plan Prescribed Dose Per Fraction: 2 Gy
Plan Total Fractions Prescribed: 21
Plan Total Prescribed Dose: 42 Gy
Reference Point Dosage Given to Date: 68 Gy
Reference Point Session Dosage Given: 2 Gy
Session Number: 34

## 2023-06-29 NOTE — Progress Notes (Signed)
Nutrition follow-up completed with patient and wife after radiation therapy for nasopharyngeal cancer.  He will complete his final treatment tomorrow, November 6.  Patient does not have a feeding tube.  Estimated nutrient needs: 2250-2600 cal, 105-128 g protein, greater than 2.3 L fluid.  Weight is stable at 164.8 pounds from 163.8 pounds October 28. Patient weighed 162.8 pounds on September 24.  Reports his throat is very sore and he has some mouth sores.  His throat is swollen and it is harder to swallow liquids.  He is drinking 5 cartons of boost VHC since Saturday.  Reports he does not tolerate even soft foods or other liquids at this time.  (Other than a few bites of ice cream) He notes it takes him about 25 minutes to drink 1 carton of boost VHC.  He uses lidocaine in order to tolerate doing swallowing exercises.  Nutrition diagnosis: Inadequate oral intake, stable.  Intervention: Continue boost VHC, 5 cartons daily.  This provides 2650 cal and 110 g protein.  This meets 100% estimated nutrition needs. Congratulated patient regarding his persistence with oral intake. Recommended patient begin other liquids as tolerated and gradually increase to softer foods. Try cool-mist humidifier in the bedroom overnight to thin overnight secretions.  Monitoring, evaluation, goals: Patient will tolerate adequate calories and protein to minimize weight loss and promote healing.  Next visit: Tuesday, December 3.  **Disclaimer: This note was dictated with voice recognition software. Similar sounding words can inadvertently be transcribed and this note may contain transcription errors which may not have been corrected upon publication of note.**

## 2023-06-30 ENCOUNTER — Other Ambulatory Visit: Payer: Self-pay

## 2023-06-30 ENCOUNTER — Ambulatory Visit
Admission: RE | Admit: 2023-06-30 | Discharge: 2023-06-30 | Disposition: A | Discharge status: Home / Self Care | Payer: BC Managed Care – PPO | Source: Ambulatory Visit | Attending: Radiation Oncology

## 2023-06-30 DIAGNOSIS — C801 Malignant (primary) neoplasm, unspecified: Secondary | ICD-10-CM | POA: Diagnosis not present

## 2023-06-30 DIAGNOSIS — C770 Secondary and unspecified malignant neoplasm of lymph nodes of head, face and neck: Secondary | ICD-10-CM | POA: Diagnosis not present

## 2023-06-30 DIAGNOSIS — Z510 Encounter for antineoplastic radiation therapy: Secondary | ICD-10-CM | POA: Diagnosis not present

## 2023-06-30 LAB — RAD ONC ARIA SESSION SUMMARY
Course Elapsed Days: 48
Plan Fractions Treated to Date: 21
Plan Prescribed Dose Per Fraction: 2 Gy
Plan Total Fractions Prescribed: 21
Plan Total Prescribed Dose: 42 Gy
Reference Point Dosage Given to Date: 70 Gy
Reference Point Session Dosage Given: 2 Gy
Session Number: 35

## 2023-06-30 NOTE — Progress Notes (Signed)
Oncology Nurse Navigator Documentation   Met with Peter [name] and his wife after final RT to offer support and to celebrate end of radiation treatment.   Provided verbal post-RT guidance: Importance of keeping all follow-up appts, especially those with Nutrition and SLP. Importance of protecting treatment area from sun. Continuation of Sonafine application 2-3 times daily, application of antibiotic ointment to areas of raw skin; when supply of Sonafine exhausted transition to OTC lotion with vitamin E.  Explained my role as navigator will continue for several more months, encouraged him to call me with needs/concerns.    Hedda Slade RN, BSN, OCN Head & Neck Oncology Nurse Navigator [facility] at [facility] Phone # [phone]  Fax # [phone]

## 2023-07-01 NOTE — Radiation Completion Notes (Signed)
Patient Name: Peter [name], Peter [name] MRN: [id number] Date of Birth: 08/18/1966 Referring Physician: Christia Reading, M.D. Date of Service: 2023-07-01 Radiation Oncologist: Lonie Peak, M.D. [facility] [place]                             RADIATION ONCOLOGY END OF TREATMENT NOTE     Diagnosis: C77.0 Secondary and unspecified malignant neoplasm of lymph nodes of head, face and neck Staging on 2023-04-07: Head and neck cancer (HCC) T=cT0, N=cN1, M=cM0 Intent: Curative     ==========DELIVERED PLANS==========  First Treatment Date: 2023-05-13 - Last Treatment Date: 2023-06-30   Plan Name: HN_Nasop Site: Oropharynx Technique: IMRT Mode: Photon Dose Per Fraction: 2 Gy Prescribed Dose (Delivered / Prescribed): 28 Gy / 28 Gy Prescribed Fxs (Delivered / Prescribed): 14 / 14   Plan Name: HN_Orop Site: Oropharynx Technique: IMRT Mode: Photon Dose Per Fraction: 2 Gy Prescribed Dose (Delivered / Prescribed): 42 Gy / 42 Gy Prescribed Fxs (Delivered / Prescribed): 21 / 21     ==========ON TREATMENT VISIT DATES========== 2023-05-17, 2023-05-24, 2023-05-31, 2023-06-07, 2023-06-14, 2023-06-21, 2023-06-28     ==========UPCOMING VISITS==========       ==========APPENDIX - ON TREATMENT VISIT NOTES==========   See weekly On Treatment Notes in Epic for details.

## 2023-07-04 ENCOUNTER — Encounter: Payer: Self-pay | Admitting: Radiation Oncology

## 2023-07-08 ENCOUNTER — Ambulatory Visit: Payer: BC Managed Care – PPO | Attending: Radiation Oncology

## 2023-07-08 DIAGNOSIS — R1310 Dysphagia, unspecified: Secondary | ICD-10-CM | POA: Diagnosis not present

## 2023-07-08 NOTE — Therapy (Signed)
OUTPATIENT SPEECH LANGUAGE PATHOLOGY ONCOLOGY EVALUATION   Patient Name: Peter [name] MRN: [id number] DOB:1966-07-03, 57 y.o., male Today's Date: 07/08/2023  PCP: Cheryll Cockayne, MD REFERRING PROVIDER: Lonie Peak, MD  END OF SESSION:  End of Session - 07/08/23 1445     Visit Number 2    Number of Visits 7    Date for SLP Re-Evaluation 08/25/23    SLP Start Time 1318    SLP Stop Time  1354    SLP Time Calculation (min) 36 min    Activity Tolerance Patient tolerated treatment well             Past Medical History:  Diagnosis Date   COVID 04/04/2021   states mild sx   Degenerative disc disease, lumbar    Dr. Shelle Iron   Dyslipidemia    GERD (gastroesophageal reflux disease)    Helicobacter pylori antibody positive 02/21/2005   treated with antibiotics   Past Surgical History:  Procedure Laterality Date   COLONOSCOPY  2013   normal   ESOPHAGOGASTRODUODENOSCOPY  2007   Dr. 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  Pt accompanied by: significant other  PERTINENT HISTORY:  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 Hx: He presented to Dr. Clovis Cao at [facility] on 01/28/23 with a left sided neck lump X 1 week. Physical exam revealed a 1 cm left level II somewhat mobile mass. No other palpable masses or enlarged were noted in the neck. 02/03/23 CT neck demonstrated a 3.1 cystic/necrotic left level IIA lymph node suspicious for metastatic disease. No definite primary was identified in the neck. 02/18/23 FNA collected by Dr. Jenne Pane showed findings consistent with atypical dyskeratotic squamous cells suspicious for malignancy; p16 negative. Given the patient's age and presence of dyskeratotic cells, the features were noted as suspicious for squamous cell carcinoma. Of note: clear yellow fluid was aspirated from the left neck mass during FNA. 03/11/23 core biopsy of left cervical node showed findings consistent with metastatic SCC, p 16 +. 03/18/23 PET demonstrated hypermetabolism associated with the left level IIA cervical lymph node, consistent with metastatic squamous cell carcinoma. PET otherwise showed no primary head and neck malignancy identified, and no evidence of metastatic disease in the chest, abdomen, or pelvis. Symmetric hypermetabolic activity was appreciated in both tonsillar regions, however this is likely physiologic or inflammatory in etiology. 03/26/23 Consult with Dr. Lou Cal, Laryngoscopy unremarkable. She recommended another biopsy under anesthesia. 04/05/23 Biopsies of throat (tonsil, base of tongue, epiglottis) all negative.  04/06/23 Consult with Dr. Basilio Cairo, 04/15/23 Consult with Dr. Truett Perna. He will receive radiation only. Treatment plan:  He will receive 35 fractions of radiation to his Oropharynx and bilateral neck.Treatment started 9/19 and will complete 11/6  PAIN:  Are you having pain? Yes: NPRS scale: >5/10 Pain location: throat Pain description: sore Aggravating factors: swallowing Relieving factors: meds  FALLS: Has patient fallen in last 6 months?  No  PATIENT GOALS: Maintain WNL swallowing  OBJECTIVE:  Note: Objective  measures were completed at Evaluation unless otherwise noted.   TODAY'S TREATMENT:                                                                                                                                         DATE:   07/08/23: Pt performed two reps of each exercise on HEP with initial cue to protrude tongue further. Independent by session end.  Pt is swallowing POs of Boost VHC, and water. Does not reports notable episodes of coughing at this time. He drank water today without any signs oral difficulty, nor any s/sx (overt) of pharyngeal deficits. Pt told SLP rationale for HEP and for food journal.   05/27/23 (eval): Research states the risk for dysphagia increases due to radiation and/or chemotherapy treatment due to a variety of factors, so SLP educated the pt about the possibility of reduced/limited ability for PO intake during rad tx. SLP also educated pt regarding possible changes to swallowing musculature after rad tx, and why adherence to dysphagia HEP provided today and PO consumption was necessary to inhibit muscle fibrosis following rad tx and to mitigate muscle disuse atrophy. SLP informed pt why this would be detrimental to their swallowing status and to their pulmonary health. Pt demonstrated understanding of these things to SLP. SLP encouraged pt to safely eat and drink as deep into their radiation/chemotherapy as possible to provide the best possible long-term swallowing outcome for pt.  SLP then developed an individualized HEP for pt involving oral and pharyngeal strengthening and ROM and pt was instructed how to perform these exercises, including SLP demonstration. After SLP demonstration, pt return demonstrated each exercise. SLP ensured pt performance was correct prior to educating pt on next exercise. Pt required min cues faded to modified independent to perform HEP. Pt was instructed to complete this program 6-7 days/week, at least 2 times a day until 6 months after his or  her last day of rad tx, and then x2 a week after that, indefinitely. Among other modifications for days when pt cannot functionally swallow, SLP also suggested pt to perform only non-swallowing tasks on the handout/HEP, and if necessary to cycle through the swallowing portion so the full program of exercises can be completed instead of fatiguing on one of the swallowing exercises and being unable to perform the other swallowing exercises. SLP instructed that swallowing exercises should then be added back into the regimen as pt is able to do so. Secondly, pt was told that former patients have told SLP that during their course of radiation therapy, taking prescribed pain medication just prior to performing HEP (and eating/drinking) has proven helpful in completing HEP (and eating  and drinking) more regularly when going through their course of radiation treatment.    PATIENT EDUCATION: Education details: HEP procedure Person educated: Patient and Spouse Education method: Explanation, Demonstration, and Verbal cues Education comprehension: verbalized understanding, returned demonstration, verbal cues required, and needs further education   ASSESSMENT:  CLINICAL IMPRESSION: Patient is a 57 y.o. M who was seen today for treatment of swallowing as they undergo radiation/chemoradiation therapy. Today pt ate items from  (nothing eaten today)  and drank thin liquids, and grimaced with swallowing each sip. At this time pt swallowing is deemed WNL/WFL with these POs. No oral or overt s/sx pharyngeal deficits, including aspiration were observed. There are no overt s/s aspiration PNA observed by SLP nor any reported by pt at this time. Data indicate that pt's swallow ability will likely decrease over the course of radiation/chemoradiation therapy and could very well decline over time following the conclusion of that therapy due to muscle disuse atrophy and/or muscle fibrosis. Pt will cont to need to be seen by SLP in  order to assess safety of PO intake, assess the need for recommending any objective swallow assessment, and ensuring pt is correctly completing the individualized HEP.  OBJECTIVE IMPAIRMENTS: include voice disorder and dysphagia. These impairments are limiting patient from effectively communicating at home and in community and safety when swallowing. Factors affecting potential to achieve goals and functional outcome are  none noted . Patient will benefit from skilled SLP services to address above impairments and improve overall function.   REHAB POTENTIAL: Good   GOALS: Goals reviewed with patient? No   SHORT TERM GOALS: Target: 3rd total session   1. Pt will complete HEP with modified independence in 2 sessions Baseline:07/08/23 Goal status: INITIAL   2.  pt will tell SLP why pt is completing HEP with modified independence Baseline:  Goal status: met   3.  pt will describe 3 overt s/s aspiration PNA with modified independence Baseline:  Goal status: INITIAL   4.  pt will tell SLP how a food journal could hasten return to a more normalized diet Baseline:  Goal status: met     LONG TERM GOALS: Target: 7th total session   1.  pt will complete HEP with independence over two visits Baseline:  Goal status: INITIAL   2.  pt will describe how to modify HEP over time, and the timeline associated with reduction in HEP frequency with modified independence over two sessions Baseline:  Goal status: INITIAL   PLAN:   SLP FREQUENCY:  once approx every 4 weeks   SLP DURATION:  7 sessions   PLANNED INTERVENTIONS: Aspiration precaution training, Pharyngeal strengthening exercises, Diet toleration management , Trials of upgraded texture/liquids, SLP instruction and feedback, Compensatory strategies, and Patient/family education     [facility], CCC-SLP 07/08/2023, 2:46 PM

## 2023-07-13 ENCOUNTER — Encounter: Payer: Self-pay | Admitting: Radiation Oncology

## 2023-07-13 NOTE — Progress Notes (Signed)
Patient identity verified x2  Patient Peter [name] is here for a follow-up appointment today for: Secondary and unspecified malignant neoplasm of lymph nodes of head, face and neck.   Treatment Date: First Treatment Date: 2023-05-13 - Last Treatment Date: 2023-06-30 Pain issues, if any: Sore throat 4/10 Using a feeding tube?: No Weight changes, if any: Roughly -5 lbs Swallowing issues, if any: Yes- Dysphagia Smoking or chewing tobacco? No- Never smoker Using fluoride toothpaste daily? yes Last ENT visit was on: 04/05/2023 Other notable issues, if any: Occasional burning sensation in the mouth/throat during speech and eating 4/10.  Vitals: ***  This concludes the interaction.  Ruel Favors, LPN

## 2023-07-14 ENCOUNTER — Ambulatory Visit: Payer: BC Managed Care – PPO | Attending: Radiation Oncology | Admitting: Physical Therapy

## 2023-07-14 ENCOUNTER — Other Ambulatory Visit: Payer: Self-pay

## 2023-07-14 ENCOUNTER — Ambulatory Visit
Admission: RE | Admit: 2023-07-14 | Discharge: 2023-07-14 | Disposition: A | Discharge status: Home / Self Care | Payer: BC Managed Care – PPO | Source: Ambulatory Visit | Attending: Radiation Oncology | Admitting: Radiation Oncology

## 2023-07-14 ENCOUNTER — Encounter: Payer: Self-pay | Admitting: Physical Therapy

## 2023-07-14 ENCOUNTER — Encounter: Payer: Self-pay | Admitting: Radiation Oncology

## 2023-07-14 VITALS — BP 113/71 | HR 76 | Temp 97.3°F | Resp 18 | Ht 68.0 in | Wt 160.4 lb

## 2023-07-14 DIAGNOSIS — C779 Secondary and unspecified malignant neoplasm of lymph node, unspecified: Secondary | ICD-10-CM | POA: Insufficient documentation

## 2023-07-14 DIAGNOSIS — R293 Abnormal posture: Secondary | ICD-10-CM | POA: Insufficient documentation

## 2023-07-14 DIAGNOSIS — I890 Lymphedema, not elsewhere classified: Secondary | ICD-10-CM | POA: Diagnosis not present

## 2023-07-14 DIAGNOSIS — C770 Secondary and unspecified malignant neoplasm of lymph nodes of head, face and neck: Secondary | ICD-10-CM

## 2023-07-14 DIAGNOSIS — C801 Malignant (primary) neoplasm, unspecified: Secondary | ICD-10-CM

## 2023-07-14 MED ORDER — HYDROCODONE-ACETAMINOPHEN 5-325 MG PO TABS: 1.0000 | ORAL_TABLET | Freq: Four times a day (QID) | ORAL | 0 refills | Status: DC | PRN

## 2023-07-14 NOTE — Telephone Encounter (Signed)
Telephone call  

## 2023-07-14 NOTE — Progress Notes (Signed)
Radiation Oncology         [phone] ________________________________  Name: Peter [name] MRN: [id number]  Date: 07/14/2023  DOB: 07/23/1966  Follow-Up Visit Note  CC: Pincus Sanes, MD  Christia Reading, MD  Diagnosis and Prior Radiotherapy:       ICD-10-CM   1. Secondary malignant neoplasm of cervical lymph node (HCC)  C77.0 HYDROcodone-acetaminophen (NORCO/VICODIN) 5-325 MG tablet      Cancer Staging  Head and neck cancer (HCC) Staging form: Pharynx - HPV-Mediated Oropharynx, AJCC 8th Edition - Clinical stage from 04/07/2023: Stage I (cT0, cN1, cM0, p16+) - Unsigned Stage prefix: Initial diagnosis  First Treatment Date: 2023-05-13 - Last Treatment Date: 2023-06-30   Plan Name: HN_Orop Site: Oropharynx Technique: IMRT Mode: Photon Dose Per Fraction: 2 Gy Prescribed Dose (Delivered / Prescribed): 70 Gy / 70 Gy Prescribed Fxs (Delivered / Prescribed): 35 / 35     CHIEF COMPLAINT:  Here for follow-up and surveillance of neck cancer  Narrative:  The patient returns today for routine follow-up.  Patient identity verified x2  Patient Peter [name] is here for a follow-up appointment today for: Secondary and unspecified malignant neoplasm of lymph nodes of head, face and neck.   Treatment Date: First Treatment Date: 2023-05-13 - Last Treatment Date: 2023-06-30 Pain issues, if any: Sore throat 4/10 Using a feeding tube?: No Weight changes, if any: Roughly -5 lbs Swallowing issues, if any: Yes- Dysphagia Smoking or chewing tobacco? No- Never smoker Using fluoride toothpaste daily? Yes Last ENT visit was on: 04/05/2023  Other notable issues, if any: Occasional burning sensation in the mouth/throat during speech and eating 4/10.          ALLERGIES:  has No Known Allergies.  Meds: Current Outpatient Medications  Medication Sig Dispense Refill   acetaminophen (TYLENOL) 500 MG tablet Take 1,000 mg by mouth every 6 (six) hours as needed.     famotidine (PEPCID) 20 MG  tablet Take 20 mg by mouth as needed for heartburn or indigestion.     HYDROcodone-acetaminophen (NORCO/VICODIN) 5-325 MG tablet Take 1 tablet by mouth every 6 (six) hours as needed for moderate pain (pain score 4-6). 30 tablet 0   ibuprofen (ADVIL) 400 MG tablet Take 400 mg by mouth every 6 (six) hours as needed.     lidocaine (XYLOCAINE) 2 % solution Patient: Mix 1part 2% viscous lidocaine, 1part H20. Swish & swallow 10mL of diluted mixture, before meals and at bedtime, up to QID 200 mL 3   ondansetron (ZOFRAN) 8 MG tablet Take 1 tablet (8 mg total) by mouth every 8 (eight) hours as needed for nausea or vomiting. 20 tablet 2   No current facility-administered medications for this encounter.    Physical Findings: The patient is in no acute distress. Patient is alert and oriented. Wt Readings from Last 3 Encounters:  07/14/23 160 lb 6 oz (72.7 kg)  06/08/23 164 lb 3.2 oz (74.5 kg)  06/01/23 166 lb 6.4 oz (75.5 kg)    height is 5\' 8"  (1.727 m) and weight is 160 lb 6 oz (72.7 kg). His temporal temperature is 97.3 F (36.3 C) (abnormal). His blood pressure is 113/71 and his pulse is 76. His respiration is 18 and oxygen saturation is 100%. .  General: Alert and oriented, in no acute distress HEENT: Head is normocephalic. Extraocular movements are intact. Oropharynx is notable for resolving mucositis, no thrush; thick saliva.  L tympanic membrane is clear. Neck: Neck is notable for no masses. Mild  edema at submental region Skin: Skin in treatment fields shows satisfactory healing over neck Extremities: No cyanosis or edema. Lymphatics: see Neck Exam Psychiatric: Judgment and insight are intact. Affect is appropriate.   Lab Findings: Lab Results  Component Value Date   WBC 7.0 03/25/2023   HGB 15.0 03/25/2023   HCT 46.0 03/25/2023   MCV 95.2 03/25/2023   PLT 221.0 03/25/2023    Lab Results  Component Value Date   TSH 2.87 03/25/2023    Radiographic Findings: No results  found.  Impression/Plan:    1) Head and Neck Cancer Status: healing well.    2) Nutritional Status: push po intake. Mild wt loss noted PEG tube: none  3) Risk Factors: The patient has been educated about risk factors including alcohol and tobacco abuse; they understand that avoidance of alcohol and tobacco is important to prevent recurrences as well as other cancers  4) Swallowing: continue SLP exercises  5) Dental: Encouraged to continue regular followup with dentistry, and dental hygiene including fluoride treatments as tolerated  6) Thyroid function:  check annually Lab Results  Component Value Date   TSH 2.87 03/25/2023    7) Other: L ear sensation of fullness - effusion?  Will try Sudafed and call ENT if persistent.   8) Follow-up in 2.5 months with PET scan for restaging. The patient was encouraged to call with any issues or questions before then.  On date of service, in total, I spent 30 minutes on this encounter. Patient was seen in person. _____________________________________   Lonie Peak, MD

## 2023-07-14 NOTE — Therapy (Signed)
OUTPATIENT PHYSICAL THERAPY HEAD AND NECK POST RADIATION FOLLOW UP   Patient Name: Peter [name] MRN: [id number] DOB:09-03-1965, 57 y.o., male Today's Date: 07/14/2023  END OF SESSION:  PT End of Session - 07/14/23 1405     Visit Number 2    Number of Visits 10    Date for PT Re-Evaluation 08/11/23    PT Start Time 1404    PT Stop Time 1436    PT Time Calculation (min) 32 min    Activity Tolerance Patient tolerated treatment well    Behavior During Therapy [facility] for tasks assessed/performed             Past Medical History:  Diagnosis Date   COVID 04/04/2021   states mild sx   Degenerative disc disease, lumbar    Dr. Shelle Iron   Dyslipidemia    GERD (gastroesophageal reflux disease)    Helicobacter pylori antibody positive 02/21/2005   treated with antibiotics   Past Surgical History:  Procedure Laterality Date   COLONOSCOPY  2013   normal   ESOPHAGOGASTRODUODENOSCOPY  2007   Dr. 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 I am just drinking boost. I feel like my energy is starting to come back.   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 He presented to Dr. Clovis Cao at [facility] on 01/28/23 with a left sided neck lump X 1 week. Physical exam revealed a 1 cm left level II somewhat mobile mass. No other palpable masses or enlarged were noted in the neck.  02/03/23 CT neck demonstrated a 3.1 cystic/necrotic left level IIA lymph node suspicious for metastatic disease. No definite primary was identified in the neck. 02/18/23 FNA collected by Dr. Jenne Pane showed findings consistent with atypical dyskeratotic squamous cells suspicious for malignancy; p16 negative. Given the patient's age and presence of dyskeratotic cells, the features were noted as suspicious for squamous cell carcinoma. Of note: clear yellow fluid was aspirated from the left neck mass during FNA. 03/11/23 core biopsy of left cervical node showed findings consistent with metastatic SCC, p 16 +. 03/18/23 PET demonstrated hypermetabolism associated with the left level IIA cervical lymph node, consistent with metastatic squamous cell carcinoma. PET otherwise showed no primary head and neck malignancy identified, and no evidence of metastatic disease in the chest, abdomen, or pelvis. Symmetric hypermetabolic activity was appreciated in both tonsillar regions, however this is likely physiologic or inflammatory in etiology. 03/26/23 Consult with Dr. Lou Cal, Laryngoscopy unremarkable. She recommended another biopsy under anesthesia. 04/05/23 Biopsies of throat (tonsil, base of tongue, epiglottis) all negative.  He will receive 35 fractions of radiation to his Oropharynx and bilateral neck.  Treatment started 9/19 and will complete 11/6.    PATIENT GOALS:  Reassess how my recovery is going related to neck ROM,  cervical pain, fatigue, and swelling.  PAIN:  Are you having pain? Yes NPRS scale: 4/10 Pain location: throat, top of mouth Pain orientation: Bilateral  PAIN TYPE: burning Pain description: intermittent  Aggravating factors: swallowing, talking Relieving factors: nothing  PRECAUTIONS: Recent radiation, Head and neck lymphedema risk,    OBJECTIVE:   POSTURE:  Forward head and rounded shoulders posture  30 SEC SIT TO STAND: 19 reps in 30 sec without use of UEs which is  Goodfor patient's age.  At eval: 21 reps in 30 sec without use of UEs which is  Excellent for patient's age   SHOULDER AROM:   [facility]  CERVICAL AROM:     Percent limited 07/14/23  Flexion WFL WFL  Extension [facility] WFL  Right lateral flexion [facility] WFL  Left lateral flexion WFL 25% limited  Right rotation [facility] WFL  Left rotation WFL WFL                          (Blank rows=not tested)  LYMPHEDEMA ASSESSMENT:    Circumference in cm  4 cm superior to sternal notch around neck 35.9  6 cm superior to sternal notch around neck 35.8  8 cm superior to sternal notch around neck 36.4  R lateral nostril from base of nose to medial tragus   L lateral nostril from base of nose to medial tragus   R corner of mouth to where ear lobe meets face   L corner of mouth to where ear lobe meets face   10 cm superior to sternal notch around neck 38.1     (Blank rows=not tested)  OTHER SYMPTOMS: Pain Yes Fibrosis No Pitting edema No Infections No Decreased scar mobility No  TREATMENT PERFORMED: 07/14/23: Created foam chip pack with thin stockinette and educated pt to use at least four hours/day Instructed pt in self MLD technique - stretching skin at anterior neck downwards, provided v/c and t/c for pressure and direction of stretch  PATIENT EDUCATION:  Education details: lymphedema and how it is managed through self MLD and a compression garment, chip pack at least 4 hours a day, staying active Person educated: Patient  and Spouse Education method: Explanation Education comprehension: verbalized understanding  HOME  EXERCISE PROGRAM: Reviewed previously given post op HEP. Wear chip pack at least 4 hours/day Basic MLD skin stretch technique at anterior neck  ASSESSMENT:  CLINICAL IMPRESSION: Pt returns to PT after completing radiation for treatment of metastatic squamous cell carcinoma to lymph node. He has almost returned to baseline cervical ROM but is slightly limited with L lateral neck flexion. He has also developed minimal lymphedema at anterior neck. Created foam chip pack for pt to begin wearing and educated him in basic self MLD technique. Pt would benefit from skilled PT services to decrease anterior neck lymphedema and instruct pt in independent management of lymphedema.   Pt will benefit from skilled therapeutic intervention to improve on the following deficits: decreased knowledge of condition, decreased knowledge of use of DME, increased edema, increased fascial restrictions, and postural dysfunction  PT treatment/interventions: ADL/Self care home management, [phone]- PT Re-evaluation, 97110-Therapeutic exercises, 97530- Therapeutic activity, 97535- Self Care, [id number]- Manual therapy, 97760- Orthotic Fit/training, Manual lymph drainage, and Compression bandaging    GOALS: Goals reviewed with patient? Yes  LONG TERM GOALS:  (STG=LTG) GOALS Name Target Date  Goal status  1 Pt will demonstrate a return to baseline cervical ROM measurements and not demonstrate any signs or symptoms of lymphedema. Baseline: 08/11/23 NEW  2 Pt will be independent in self MLD technique for long term management of lymphedema. 08/11/23 NEW  3 Pt will obtain a compression garment for long term management of lymphedema. 08/11/23 NEW        PLAN:  PT FREQUENCY/DURATION: 2x/wk for 4 wks  PLAN FOR NEXT SESSION: instruct in norton anterior approach MLD technique, did they order garment?, how is  swelling?   [facility]  [address], [address]  [place] [address]  [phone]   Scar massage You can begin gentle scar massage to you incision sites. Gently place one hand on the incision and move the skin (without sliding on the skin) in various directions. Do this for a few minutes and then you can gently massage either coconut oil or vitamin E cream into the scars.  Home exercise Program Continue doing the exercises you were given until you feel like you can do them without feeling any tightness at the end. It is best to do them for several months after completion of radiation since the effects of radiation continue past completion.   Walking Program Studies show that 30 minutes of walking per day (fast enough to elevate your heart rate) can significantly reduce the risk of a cancer recurrence. If you can't walk due to other medical reasons, we encourage you to find another activity you could do (like a stationary bike or water exercise).  Posture After treatment for head and neck cancer, people frequently sit with rounded shoulders and forward head posture because the front of the neck has become tight and it feels better. If you sit like this, you can become very tight and have pain in sitting or standing with good posture. Try to be aware of your posture and sit and stand up tall to heal properly.  Follow up PT: Please let you doctor know as soon as possible if you develop any swelling in your face or neck in the future. Lymphedema (swelling) can occur months after completion of radiation. The sooner you can let the doctor know, the sooner they can refer you back to PT. It is much easier to treat the swelling early on.   Milagros Loll [place], PT 07/14/2023, 2:37 PM

## 2023-07-14 NOTE — Progress Notes (Signed)
Oncology Nurse Navigator Documentation   I met with Peter [name] and his wife before and during his appointment with Dr. Basilio Cairo today. He is improving slowly after completing radiation for his head and neck cancer. He will return in about 2 1/2 months for a post treatment PET scan and follow up with Dr. Basilio Cairo to receive results. He knows to call me with any questions or concerns as needed.   Hedda Slade RN, BSN, OCN Head & Neck Oncology Nurse Navigator [facility] at [facility] Phone # [phone]  Fax # [phone]

## 2023-07-26 ENCOUNTER — Encounter: Payer: Self-pay | Admitting: Nutrition

## 2023-07-26 ENCOUNTER — Encounter: Payer: Self-pay | Admitting: Radiation Oncology

## 2023-07-26 ENCOUNTER — Other Ambulatory Visit: Payer: Self-pay | Admitting: Radiation Oncology

## 2023-07-26 DIAGNOSIS — C770 Secondary and unspecified malignant neoplasm of lymph nodes of head, face and neck: Secondary | ICD-10-CM

## 2023-07-26 DIAGNOSIS — C779 Secondary and unspecified malignant neoplasm of lymph node, unspecified: Secondary | ICD-10-CM

## 2023-07-26 MED ORDER — HYDROCODONE-ACETAMINOPHEN 5-325 MG PO TABS
1.0000 | ORAL_TABLET | Freq: Four times a day (QID) | ORAL | 0 refills | Status: DC | PRN
Start: 2023-07-26 — End: 2023-08-03

## 2023-07-26 MED ORDER — SUCRALFATE 1 G PO TABS: ORAL_TABLET | ORAL | 3 refills | Status: DC

## 2023-07-26 MED ORDER — FLUCONAZOLE 100 MG PO TABS: ORAL_TABLET | ORAL | 0 refills | Status: DC

## 2023-07-26 NOTE — Progress Notes (Signed)
Patient sent me an email requesting to cancel his nutrition appointment tomorrow. Reports he was feeling better, but has not seen any improvement over the past 2 weeks. He is experiencing painful swallowing and it is taking 4-5 hours every day to drink 4-5 Boost [facility]. He has requested additional pain medication and hopes this will help. I will follow up with patient in 2-3 weeks.

## 2023-07-26 NOTE — Progress Notes (Signed)
I spoke with the patient and his wife today as he has noted worsening soreness in his throat and sores in his throat that developed since his follow-up with me.  This is only present when swallowing or talking for long periods of time.  It is not present at rest.  I recommend that he start sucralfate to coat his throat before swallowing, and also to take 2 tablets of his Vicodin if needed at a time for severe pain.  We will also try 1 week of fluconazole in case he has a yeast infection exacerbating his pain.  He will call in 2 weeks if he does not notice any improvement.  He and his wife are pleased with this plan. -----------------------------------  Lonie Peak, MD

## 2023-07-27 ENCOUNTER — Inpatient Hospital Stay: Payer: BC Managed Care – PPO | Admitting: Nutrition

## 2023-07-28 ENCOUNTER — Ambulatory Visit: Payer: BC Managed Care – PPO | Attending: Radiation Oncology | Admitting: Physical Therapy

## 2023-07-28 ENCOUNTER — Encounter: Payer: Self-pay | Admitting: Physical Therapy

## 2023-07-28 DIAGNOSIS — R293 Abnormal posture: Secondary | ICD-10-CM | POA: Diagnosis not present

## 2023-07-28 DIAGNOSIS — C779 Secondary and unspecified malignant neoplasm of lymph node, unspecified: Secondary | ICD-10-CM | POA: Diagnosis not present

## 2023-07-28 DIAGNOSIS — I890 Lymphedema, not elsewhere classified: Secondary | ICD-10-CM | POA: Insufficient documentation

## 2023-07-28 NOTE — Therapy (Signed)
OUTPATIENT PHYSICAL THERAPY HEAD AND NECK POST RADIATION FOLLOW UP   Patient Name: Peter [name] MRN: [id number] DOB:11/03/1965, 57 y.o., male Today's Date: 07/28/2023  END OF SESSION:  PT End of Session - 07/28/23 1602     Visit Number 3    Number of Visits 10    Date for PT Re-Evaluation 08/11/23    PT Start Time 1602    PT Stop Time 1655    PT Time Calculation (min) 53 min    Activity Tolerance Patient tolerated treatment well    Behavior During Therapy [facility] for tasks assessed/performed             Past Medical History:  Diagnosis Date   COVID 04/04/2021   states mild sx   Degenerative disc disease, lumbar    Dr. Shelle Iron   Dyslipidemia    GERD (gastroesophageal reflux disease)    Helicobacter pylori antibody positive 02/21/2005   treated with antibiotics   Past Surgical History:  Procedure Laterality Date   COLONOSCOPY  2013   normal   ESOPHAGOGASTRODUODENOSCOPY  2007   Dr. 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 I have been having a lot of pain.   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 He presented to Dr. Clovis Cao at [facility] on 01/28/23 with a left sided neck lump X 1 week. Physical exam revealed a 1 cm left level II somewhat mobile mass. No other palpable masses or enlarged were  noted in the neck. 02/03/23 CT neck demonstrated a 3.1 cystic/necrotic left level IIA lymph node suspicious for metastatic disease. No definite primary was identified in the neck. 02/18/23 FNA collected by Dr. Jenne Pane showed findings consistent with atypical dyskeratotic squamous cells suspicious for malignancy; p16 negative. Given the patient's age and presence of dyskeratotic cells, the features were noted as suspicious for squamous cell carcinoma. Of note: clear yellow fluid was aspirated from the left neck mass during FNA. 03/11/23 core biopsy of left cervical node showed findings consistent with metastatic SCC, p 16 +. 03/18/23 PET demonstrated hypermetabolism associated with the left level IIA cervical lymph node, consistent with metastatic squamous cell carcinoma. PET otherwise showed no primary head and neck malignancy identified, and no evidence of metastatic disease in the chest, abdomen, or pelvis. Symmetric hypermetabolic activity was appreciated in both tonsillar regions, however this is likely physiologic or inflammatory in etiology. 03/26/23 Consult with Dr. Lou Cal, Laryngoscopy unremarkable. She recommended another biopsy under anesthesia. 04/05/23 Biopsies of throat (tonsil, base of tongue, epiglottis) all negative.  He will receive 35 fractions of radiation to his Oropharynx and bilateral neck.  Treatment started 9/19 and will complete 11/6.    PATIENT GOALS:  Reassess how my recovery is going related to neck  ROM, cervical pain, fatigue, and swelling.  PAIN:  Are you having pain? Yes NPRS scale: 1/10 Pain location: throat, top of mouth Pain orientation: Bilateral  PAIN TYPE: burning Pain description: intermittent  Aggravating factors: swallowing, talking Relieving factors: nothing  PRECAUTIONS: Recent radiation, Head and neck lymphedema risk,    OBJECTIVE:   POSTURE:  Forward head and rounded shoulders posture  30 SEC SIT TO STAND: 19 reps in 30 sec without use of UEs which is  Goodfor patient's age.  At eval: 21 reps in 30 sec without use of UEs which is  Excellent for patient's age   SHOULDER AROM:   [facility]  CERVICAL AROM:     Percent limited 07/14/23  Flexion WFL WFL  Extension [facility] WFL  Right lateral flexion [facility] WFL  Left lateral flexion WFL 25% limited  Right rotation [facility] WFL  Left rotation WFL WFL                          (Blank rows=not tested)  LYMPHEDEMA ASSESSMENT:    Circumference in cm  4 cm superior to sternal notch around neck 35.9  6 cm superior to sternal notch around neck 35.8  8 cm superior to sternal notch around neck 36.4  R lateral nostril from base of nose to medial tragus   L lateral nostril from base of nose to medial tragus   R corner of mouth to where ear lobe meets face   L corner of mouth to where ear lobe meets face   10 cm superior to sternal notch around neck 38.1     (Blank rows=not tested)  OTHER SYMPTOMS: Pain Yes Fibrosis No Pitting edema No Infections No Decreased scar mobility No  TREATMENT PERFORMED: 07/28/23: Assessed reynolds pack for correct fit and educated pt in proper donning Instructed pt and spouse using Norton anterior approach handout as follows: short neck, 5 diaphragmatic breaths, bilateral axillary nodes, bilateral pectoral nodes, anterior chest, short neck, posterior neck moving fluid towards pathway aimed at lateral neck, then lateral and anterior neck moving fluid towards pathway aimed at lateral neck then retro  and pre auricular nodes, face and jaw moving fluid towards pathway just anterior to ear  then retracing all steps had pt return demonstrate each step and provided appropriate v/c and t/c for correct pressure and direction of skin stretch with pt demonstrating a good skin stretch by end of session  07/14/23: Created foam chip pack with thin stockinette and educated pt to use at least four hours/day Instructed pt in self MLD technique - stretching skin at anterior neck downwards, provided v/c and t/c for pressure and direction of stretch  PATIENT EDUCATION:  Education details: lymphedema and how it is managed through self MLD and a compression garment, chip pack at least 4 hours a day, staying active Person educated: Patient and Spouse Education method: Explanation Education comprehension: verbalized understanding  HOME EXERCISE PROGRAM: Reviewed previously given post op HEP. Wear chip pack at least 4 hours/day Basic MLD skin stretch technique at anterior neck  ASSESSMENT:  CLINICAL IMPRESSION: Began instructing pt in self MLD using Norton anterior approach. Issued handout and instructed pt in each step and had him return demonstrate each step. He demonstrated a good skin stretch without sliding. He required occasional cueing for direction of stretch, sequence, and pressure.   Pt will benefit from skilled therapeutic intervention to improve on the following deficits: decreased knowledge of condition, decreased knowledge of use of DME, increased edema, increased fascial restrictions, and postural dysfunction  PT treatment/interventions: ADL/Self care home management, [phone]- PT Re-evaluation, 97110-Therapeutic exercises, 97530- Therapeutic activity, 97535- Self Care, [id number]- Manual therapy, 97760- Orthotic Fit/training, Manual lymph drainage, and Compression bandaging    GOALS: Goals reviewed with patient? Yes  LONG TERM GOALS:  (STG=LTG) GOALS Name Target Date  Goal status  1 Pt will  demonstrate a return to baseline cervical ROM measurements and not demonstrate any signs or symptoms of lymphedema. Baseline: 08/11/23 NEW  2 Pt will be independent in self MLD technique for long term management of lymphedema. 08/11/23 NEW  3 Pt will obtain a compression garment for long term management of lymphedema. 08/11/23 NEW        PLAN:  PT FREQUENCY/DURATION: 2x/wk for 4 wks  PLAN FOR NEXT SESSION: continue instruct in norton anterior approach MLD technique,  how is face swelling?   [facility]  [address]  [place] [address]  [phone]   Scar massage You can begin gentle scar massage to you incision sites. Gently place one hand on the incision and move the skin (without sliding on the skin) in various directions. Do this for a few minutes and then you can gently massage either coconut oil or vitamin E cream into the scars.  Home exercise Program Continue doing the exercises you were given until you feel like you can do them without feeling any tightness at the end. It is best to do them for several months after completion of radiation since the effects of radiation continue past completion.   Walking Program Studies show that 30 minutes of walking per day (fast enough to elevate your heart rate) can significantly reduce the risk of a cancer recurrence. If you can't walk due to other medical reasons, we encourage you to find another activity you could do (like a stationary bike or water exercise).  Posture After treatment for head and neck cancer, people frequently sit with rounded shoulders and forward head posture because the front of the neck has become tight and it feels better. If you sit like this, you can become very tight and have pain in sitting or standing with good posture. Try to be aware of  your posture and sit and stand up tall to heal properly.  Follow up PT: Please let you doctor know as soon as possible if you  develop any swelling in your face or neck in the future. Lymphedema (swelling) can occur months after completion of radiation. The sooner you can let the doctor know, the sooner they can refer you back to PT. It is much easier to treat the swelling early on.   [facility] [place], PT 07/28/2023, 5:01 PM

## 2023-08-03 ENCOUNTER — Encounter: Payer: Self-pay | Admitting: Physical Therapy

## 2023-08-03 ENCOUNTER — Ambulatory Visit: Payer: BC Managed Care – PPO | Admitting: Physical Therapy

## 2023-08-03 ENCOUNTER — Other Ambulatory Visit: Payer: Self-pay | Admitting: Radiation Oncology

## 2023-08-03 DIAGNOSIS — C770 Secondary and unspecified malignant neoplasm of lymph nodes of head, face and neck: Secondary | ICD-10-CM

## 2023-08-03 DIAGNOSIS — C779 Secondary and unspecified malignant neoplasm of lymph node, unspecified: Secondary | ICD-10-CM | POA: Diagnosis not present

## 2023-08-03 DIAGNOSIS — I890 Lymphedema, not elsewhere classified: Secondary | ICD-10-CM | POA: Diagnosis not present

## 2023-08-03 DIAGNOSIS — R293 Abnormal posture: Secondary | ICD-10-CM | POA: Diagnosis not present

## 2023-08-03 DIAGNOSIS — C760 Malignant neoplasm of head, face and neck: Secondary | ICD-10-CM

## 2023-08-03 MED ORDER — LIDOCAINE VISCOUS HCL 2 % MT SOLN: OROMUCOSAL | 3 refills | Status: DC

## 2023-08-03 MED ORDER — HYDROCODONE-ACETAMINOPHEN 5-325 MG PO TABS
1.0000 | ORAL_TABLET | Freq: Four times a day (QID) | ORAL | 0 refills | Status: DC | PRN
Start: 2023-08-03 — End: 2023-08-16

## 2023-08-03 NOTE — Therapy (Signed)
OUTPATIENT PHYSICAL THERAPY HEAD AND NECK POST RADIATION FOLLOW UP   Patient Name: Peter [name] MRN: [id number] DOB:1966-05-25, 57 y.o., male Today's Date: 08/03/2023  END OF SESSION:  PT End of Session - 08/03/23 1453     Visit Number 4    Number of Visits 10    Date for PT Re-Evaluation 08/11/23    PT Start Time 1453    PT Stop Time 1548    PT Time Calculation (min) 55 min    Activity Tolerance Patient tolerated treatment well    Behavior During Therapy [facility] for tasks assessed/performed             Past Medical History:  Diagnosis Date   COVID 04/04/2021   states mild sx   Degenerative disc disease, lumbar    Dr. Shelle Iron   Dyslipidemia    GERD (gastroesophageal reflux disease)    Helicobacter pylori antibody positive 02/21/2005   treated with antibiotics   Past Surgical History:  Procedure Laterality Date   COLONOSCOPY  2013   normal   ESOPHAGOGASTRODUODENOSCOPY  2007   Dr. 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 I feel like the neck is going down but the cheeks are the same.   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 He presented to Dr. Clovis Cao at [facility] on 01/28/23 with a left sided neck lump X 1 week. Physical exam revealed a 1 cm left level II somewhat mobile mass. No  other palpable masses or enlarged were noted in the neck. 02/03/23 CT neck demonstrated a 3.1 cystic/necrotic left level IIA lymph node suspicious for metastatic disease. No definite primary was identified in the neck. 02/18/23 FNA collected by Dr. Jenne Pane showed findings consistent with atypical dyskeratotic squamous cells suspicious for malignancy; p16 negative. Given the patient's age and presence of dyskeratotic cells, the features were noted as suspicious for squamous cell carcinoma. Of note: clear yellow fluid was aspirated from the left neck mass during FNA. 03/11/23 core biopsy of left cervical node showed findings consistent with metastatic SCC, p 16 +. 03/18/23 PET demonstrated hypermetabolism associated with the left level IIA cervical lymph node, consistent with metastatic squamous cell carcinoma. PET otherwise showed no primary head and neck malignancy identified, and no evidence of metastatic disease in the chest, abdomen, or pelvis. Symmetric hypermetabolic activity was appreciated in both tonsillar regions, however this is likely physiologic or inflammatory in etiology. 03/26/23 Consult with Dr. Lou Cal, Laryngoscopy unremarkable. She recommended another biopsy under anesthesia. 04/05/23 Biopsies of throat (tonsil, base of tongue, epiglottis) all negative.  He will receive 35 fractions of radiation to his Oropharynx and bilateral neck.  Treatment started 9/19 and will complete 11/6.    PATIENT GOALS:  Reassess how  my recovery is going related to neck ROM, cervical pain, fatigue, and swelling.  PAIN:  Are you having pain? Yes NPRS scale: 3/10 Pain location: throat, top of mouth Pain orientation: Bilateral  PAIN TYPE: raw Pain description: intermittent  Aggravating factors: swallowing, talking Relieving factors: nothing  PRECAUTIONS: Recent radiation, Head and neck lymphedema risk,    OBJECTIVE:   POSTURE:  Forward head and rounded shoulders posture  30 SEC SIT TO STAND: 19 reps in 30 sec without use of UEs which is  Goodfor patient's age.  At eval: 21 reps in 30 sec without use of UEs which is  Excellent for patient's age   SHOULDER AROM:   [facility]  CERVICAL AROM:     Percent limited 07/14/23  Flexion WFL WFL  [facility] WFL  Right lateral flexion [facility] WFL  Left lateral flexion WFL 25% limited  Right rotation [facility] WFL  Left rotation WFL WFL                          (Blank rows=not tested)  LYMPHEDEMA ASSESSMENT:    Circumference in cm  4 cm superior to sternal notch around neck 35.9  6 cm superior to sternal notch around neck 35.8  8 cm superior to sternal notch around neck 36.4  R lateral nostril from base of nose to medial tragus   L lateral nostril from base of nose to medial tragus   R corner of mouth to where ear lobe meets face   L corner of mouth to where ear lobe meets face   10 cm superior to sternal notch around neck 38.1     (Blank rows=not tested)  OTHER SYMPTOMS: Pain Yes Fibrosis No Pitting edema No Infections No Decreased scar mobility No  TREATMENT PERFORMED: 08/03/23: Sent pt instructions for ordering Tribute head and neck garment since he is having edema in his cheeks Had pt demonstrate entire Norton anterior approach and provided appropriate v/c for hand placement and skin stretch as well as pressure: Instructed pt using Norton anterior approach handout as follows: short neck, 5 diaphragmatic breaths, bilateral axillary nodes, bilateral pectoral  nodes, anterior chest, short neck, posterior neck moving fluid towards pathway aimed at lateral  neck, then lateral and anterior neck moving fluid towards pathway, pre and retro auricular nodes then bilateral face and jaw all moving towards pathway in front of ear then therapist took over and worked on face and anterior neck and retraced all steps   07/28/23: Assessed reynolds pack for correct fit and educated pt in proper donning Instructed pt and spouse using Norton anterior approach handout as follows: short neck, 5 diaphragmatic breaths, bilateral axillary nodes, bilateral pectoral nodes, anterior chest, short neck, posterior neck moving fluid towards pathway aimed at lateral neck, then lateral and anterior neck moving fluid towards pathway aimed at lateral neck then retro and pre auricular nodes, face and jaw moving fluid towards pathway just anterior to ear then retracing all steps had pt return demonstrate each step and provided appropriate v/c and t/c for correct pressure and direction of skin stretch with pt demonstrating a good skin stretch by end of session  07/14/23: Created foam chip pack with thin stockinette and educated pt to use at least four hours/day Instructed pt in self MLD technique - stretching skin at anterior neck downwards, provided v/c and t/c for pressure and direction of stretch  PATIENT EDUCATION:  Education details: lymphedema and how it is managed through self MLD and a compression garment, chip pack at least 4 hours a day, staying active Person educated: Patient and Spouse Education method: Explanation Education comprehension: verbalized understanding  HOME EXERCISE PROGRAM: Reviewed previously given post op HEP. Wear chip pack at least 4 hours/day Basic MLD skin stretch technique at anterior neck  ASSESSMENT:  CLINICAL IMPRESSION: Had pt demonstrate entire sequence using handout. He was very familiar with the sequence and did not require much cueing for the  sequence and only occasional cues for skin stretch and hand placement. He was given info to obtain a tribute head and neck garment for coverage at his cheeks and jaw since he has edema in that area.   Pt will benefit from skilled therapeutic intervention to improve on the following deficits: decreased knowledge of condition, decreased knowledge of use of DME, increased edema, increased fascial restrictions, and postural dysfunction  PT treatment/interventions: ADL/Self care home management, [phone]- PT Re-evaluation, 97110-Therapeutic exercises, 97530- Therapeutic activity, 97535- Self Care, [id number]- Manual therapy, 97760- Orthotic Fit/training, Manual lymph drainage, and Compression bandaging    GOALS: Goals reviewed with patient? Yes  LONG TERM GOALS:  (STG=LTG) GOALS Name Target Date  Goal status  1 Pt will demonstrate a return to baseline cervical ROM measurements and not demonstrate any signs or symptoms of lymphedema. Baseline: 08/11/23 NEW  2 Pt will be independent in self MLD technique for long term management of lymphedema. 08/11/23 NEW  3 Pt will obtain a compression garment for long term management of lymphedema. 08/11/23 NEW        PLAN:  PT FREQUENCY/DURATION: 2x/wk for 4 wks  PLAN FOR NEXT SESSION: continue instruct in norton anterior approach MLD technique,  how is face swelling?   [facility]  [address], [address]  [place] [address]  [phone]   Scar massage You can begin gentle scar massage to you incision sites. Gently place one hand on the incision and move the skin (without sliding on the skin) in various directions. Do this for a few minutes and then you can gently massage either coconut oil or vitamin E cream into the scars.  Home exercise Program Continue doing the exercises you were given until you feel like you can do them without  feeling any tightness at the end. It is best to do them for several months after completion of  radiation since the effects of radiation continue past completion.   Walking Program Studies show that 30 minutes of walking per day (fast enough to elevate your heart rate) can significantly reduce the risk of a cancer recurrence. If you can't walk due to other medical reasons, we encourage you to find another activity you could do (like a stationary bike or water exercise).  Posture After treatment for head and neck cancer, people frequently sit with rounded shoulders and forward head posture because the front of the neck has become tight and it feels better. If you sit like this, you can become very tight and have pain in sitting or standing with good posture. Try to be aware of your posture and sit and stand up tall to heal properly.  Follow up PT: Please let you doctor know as soon as possible if you develop any swelling in your face or neck in the future. Lymphedema (swelling) can occur months after completion of radiation. The sooner you can let the doctor know, the sooner they can refer you back to PT. It is much easier to treat the swelling early on.   Milagros Loll [place], PT 08/03/2023, 3:51 PM

## 2023-08-05 ENCOUNTER — Encounter: Payer: Self-pay | Admitting: Physical Therapy

## 2023-08-05 ENCOUNTER — Ambulatory Visit: Payer: BC Managed Care – PPO | Admitting: Physical Therapy

## 2023-08-05 DIAGNOSIS — R293 Abnormal posture: Secondary | ICD-10-CM | POA: Diagnosis not present

## 2023-08-05 DIAGNOSIS — C779 Secondary and unspecified malignant neoplasm of lymph node, unspecified: Secondary | ICD-10-CM | POA: Diagnosis not present

## 2023-08-05 DIAGNOSIS — I890 Lymphedema, not elsewhere classified: Secondary | ICD-10-CM

## 2023-08-05 NOTE — Therapy (Signed)
OUTPATIENT PHYSICAL THERAPY HEAD AND NECK POST RADIATION FOLLOW UP   Patient Name: Peter [name] MRN: [id number] DOB:1965-10-16, 57 y.o., male Today's Date: 08/05/2023  END OF SESSION:  PT End of Session - 08/05/23 1503     Visit Number 5    Number of Visits 10    Date for PT Re-Evaluation 08/11/23    PT Start Time 1502    PT Stop Time 1555    PT Time Calculation (min) 53 min    Activity Tolerance Patient tolerated treatment well    Behavior During Therapy [facility] for tasks assessed/performed             Past Medical History:  Diagnosis Date   COVID 04/04/2021   states mild sx   Degenerative disc disease, lumbar    Dr. Shelle Iron   Dyslipidemia    GERD (gastroesophageal reflux disease)    Helicobacter pylori antibody positive 02/21/2005   treated with antibiotics   Past Surgical History:  Procedure Laterality Date   COLONOSCOPY  2013   normal   ESOPHAGOGASTRODUODENOSCOPY  2007   Dr. 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 It was the first time in 6 weeks. It was a sweet potato. I think the jaw swelling was a little better today.   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 He presented to Dr. Clovis Cao at [facility] on 01/28/23 with a left sided neck lump X 1 week. Physical exam revealed a 1 cm left level II somewhat mobile mass.  No other palpable masses or enlarged were noted in the neck. 02/03/23 CT neck demonstrated a 3.1 cystic/necrotic left level IIA lymph node suspicious for metastatic disease. No definite primary was identified in the neck. 02/18/23 FNA collected by Dr. Jenne Pane showed findings consistent with atypical dyskeratotic squamous cells suspicious for malignancy; p16 negative. Given the patient's age and presence of dyskeratotic cells, the features were noted as suspicious for squamous cell carcinoma. Of note: clear yellow fluid was aspirated from the left neck mass during FNA. 03/11/23 core biopsy of left cervical node showed findings consistent with metastatic SCC, p 16 +. 03/18/23 PET demonstrated hypermetabolism associated with the left level IIA cervical lymph node, consistent with metastatic squamous cell carcinoma. PET otherwise showed no primary head and neck malignancy identified, and no evidence of metastatic disease in the chest, abdomen, or pelvis. Symmetric hypermetabolic activity was appreciated in both tonsillar regions, however this is likely physiologic or inflammatory in etiology. 03/26/23 Consult with Dr. Lou Cal, Laryngoscopy unremarkable. She recommended another biopsy under anesthesia. 04/05/23 Biopsies of throat (tonsil, base of tongue, epiglottis) all negative.  He will receive 35 fractions of radiation to his Oropharynx and bilateral neck.  Treatment started 9/19 and will complete 11/6.    PATIENT GOALS:  Reassess  how my recovery is going related to neck ROM, cervical pain, fatigue, and swelling.  PAIN:  Are you having pain? Yes NPRS scale: 3/10 Pain location: throat Pain orientation: Bilateral  PAIN TYPE: raw Pain description: intermittent  Aggravating factors: swallowing Relieving factors: nothing  PRECAUTIONS: Recent radiation, Head and neck lymphedema risk,    OBJECTIVE:   POSTURE:  Forward head and rounded shoulders posture  30 SEC SIT TO STAND: 19 reps in 30 sec without use of UEs which is  Goodfor patient's age.  At eval: 21 reps in 30 sec without use of UEs which is  Excellent for patient's age   SHOULDER AROM:   [facility]  CERVICAL AROM:     Percent limited 07/14/23  Flexion WFL WFL  [facility] WFL  Right lateral flexion [facility] WFL  Left lateral flexion WFL 25% limited  Right rotation [facility] WFL  Left rotation WFL WFL                          (Blank rows=not tested)  LYMPHEDEMA ASSESSMENT:    Circumference in cm  4 cm superior to sternal notch around neck 35.9  6 cm superior to sternal notch around neck 35.8  8 cm superior to sternal notch around neck 36.4  R lateral nostril from base of nose to medial tragus   L lateral nostril from base of nose to medial tragus   R corner of mouth to where ear lobe meets face   L corner of mouth to where ear lobe meets face   10 cm superior to sternal notch around neck 38.1     (Blank rows=not tested)  OTHER SYMPTOMS: Pain Yes Fibrosis No Pitting edema No Infections No Decreased scar mobility No  TREATMENT PERFORMED: 08/05/23: Had pt demonstrate entire Norton anterior approach and provided appropriate v/c for hand placement and skin stretch as well as pressure: Instructed pt using Norton anterior approach handout as follows: short neck, 5 diaphragmatic breaths, bilateral axillary nodes, bilateral pectoral nodes, anterior chest, short neck, posterior neck moving fluid towards pathway aimed at lateral neck, then lateral (educated pt  on hand placement slightly closer to pathway for 9B) and anterior neck moving fluid towards pathway,  pre and retro auricular nodes then bilateral face and jaw all moving towards pathway in front of ear then therapist took over and worked on face and anterior neck and retraced all steps   08/03/23: Sent pt instructions for ordering Tribute head and neck garment since he is having edema in his cheeks Had pt demonstrate entire Norton anterior approach and provided appropriate v/c for hand placement and skin stretch as well as pressure: Instructed pt using Norton anterior approach handout as follows: short neck, 5 diaphragmatic breaths, bilateral axillary nodes, bilateral pectoral nodes, anterior chest, short neck, posterior neck moving fluid towards pathway aimed at lateral neck, then lateral and anterior neck moving fluid towards pathway, pre and retro auricular nodes then bilateral face and jaw all moving towards pathway in front of ear then therapist took over and worked on face and anterior neck and retraced all steps   07/28/23: Assessed reynolds pack for correct fit and educated pt in proper donning Instructed pt and spouse using Norton anterior approach handout as follows: short neck, 5 diaphragmatic breaths, bilateral axillary nodes, bilateral pectoral nodes, anterior chest, short neck, posterior neck moving fluid towards pathway aimed at lateral neck, then lateral and anterior neck moving fluid towards pathway aimed at lateral neck then retro and pre auricular nodes, face and jaw moving fluid towards pathway just anterior to ear then retracing all steps had pt return demonstrate each step and provided appropriate v/c and t/c for correct pressure and direction of skin stretch with pt demonstrating a good skin stretch by end of session  07/14/23: Created foam chip pack with thin stockinette and educated pt to use at least four hours/day Instructed pt in self MLD technique - stretching skin at anterior  neck downwards, provided v/c and t/c for pressure and direction of stretch  PATIENT EDUCATION:  Education details: lymphedema and how it is managed through self MLD and a compression garment, chip pack at least 4 hours a day, staying active Person educated: Patient and Spouse Education method: Explanation Education comprehension: verbalized understanding  HOME EXERCISE PROGRAM: Reviewed previously given post op HEP. Wear chip pack at least 4 hours/day Basic MLD skin stretch technique at anterior neck  ASSESSMENT:  CLINICAL IMPRESSION: Continued to have pt demonstrate entire sequence using handout. Pt continues to demonstrate good skin stretch with minimal cues on certain steps for hand placement and pressure. Overall pt is progressing towards independence with this. He has ordered the tribute head and neck garment and is waiting for it to arrive.   Pt will benefit from skilled therapeutic intervention to improve on the following deficits: decreased knowledge of condition, decreased knowledge of use of DME, increased edema, increased fascial restrictions, and postural dysfunction  PT treatment/interventions: ADL/Self care home management, [phone]- PT Re-evaluation, 97110-Therapeutic exercises, 97530- Therapeutic activity, 97535- Self Care, [id number]- Manual therapy, 97760- Orthotic Fit/training, Manual lymph drainage, and Compression bandaging    GOALS: Goals reviewed with patient? Yes  LONG TERM GOALS:  (STG=LTG) GOALS Name Target Date  Goal status  1 Pt will demonstrate a return to baseline cervical ROM measurements and not demonstrate any signs or symptoms of lymphedema. Baseline: 08/11/23 NEW  2 Pt will be independent in self MLD technique for long term management of lymphedema. 08/11/23 NEW  3 Pt will obtain a compression garment for long term management of lymphedema. 08/11/23 NEW        PLAN:  PT FREQUENCY/DURATION: 2x/wk for 4 wks  PLAN FOR NEXT SESSION: continue instruct  in norton anterior  approach MLD technique,  how is face swelling?   [facility]  [address], [address]  [place] [address]  [phone]   Scar massage You can begin gentle scar massage to you incision sites. Gently place one hand on the incision and move the skin (without sliding on the skin) in various directions. Do this for a few minutes and then you can gently massage either coconut oil or vitamin E cream into the scars.  Home exercise Program Continue doing the exercises you were given until you feel like you can do them without feeling any tightness at the end. It is best to do them for several months after completion of radiation since the effects of radiation continue past completion.   Walking Program Studies show that 30 minutes of walking per day (fast enough to elevate your heart rate) can significantly reduce the risk of a cancer recurrence. If you can't walk due to other medical reasons, we encourage you to find another activity you could do (like a stationary bike or water exercise).  Posture After treatment for head and neck cancer, people frequently sit with rounded shoulders and forward head posture because the front of the neck has become tight and it feels better. If you sit like this, you can become very tight and have pain in sitting or standing with good posture. Try to be aware of your posture and sit and stand up tall to heal properly.  Follow up PT: Please let you doctor know as soon as possible if you develop any swelling in your face or neck in the future. Lymphedema (swelling) can occur months after completion of radiation. The sooner you can let the doctor know, the sooner they can refer you back to PT. It is much easier to treat the swelling early on.   Milagros Loll [place], PT 08/05/2023, 3:58 PM

## 2023-08-09 ENCOUNTER — Inpatient Hospital Stay: Payer: BC Managed Care – PPO | Attending: Oncology | Admitting: Nutrition

## 2023-08-09 NOTE — Progress Notes (Signed)
Telephone follow-up completed with patient status post radiation therapy for nasopharyngeal cancer.  He completed treatment November 6.  Patient does not have a feeding tube.  Patient reports his weight is stable on home scale at 157 pounds.  States he was prescribed Carafate, antibiotics, and additional pain medication December 2 due to increased pain.  He states this helped him and he was able to eat a little bit better.  He continues to drink 4 very high-calorie boost daily.  It takes him about 30 to 35 minutes to drink 1 carton.  He has been successful eating some sweet potatoes, ice cream, and a scrambled egg.  His voice is clear.  He feels well overall.  He has begun to be able to taste original boost and ice cream.  States his mouth sores have reappeared and he has started baking soda and salt water gargles again.  Estimated nutrition needs: 2250-2600 cal, 105-120 g protein, greater than 2.3 L fluid.  Nutrition diagnosis: Inadequate oral intake, improving.  Intervention: Continue a minimum of 4 cartons boost VHC daily. Continue to try liquids and soft foods at mealtimes.  Provided examples. Continue pain medications and salt water gargles as needed.  Monitoring, evaluation, goals: Patient will tolerate increased calories and protein to minimize weight loss and promote continued healing.  Next visit: Will contact patient by telephone in 4 to 6 weeks.  **Disclaimer: This note was dictated with voice recognition software. Similar sounding words can inadvertently be transcribed and this note may contain transcription errors which may not have been corrected upon publication of note.**

## 2023-08-10 ENCOUNTER — Encounter: Payer: BC Managed Care – PPO | Admitting: Nutrition

## 2023-08-10 ENCOUNTER — Ambulatory Visit: Payer: BC Managed Care – PPO | Admitting: Physical Therapy

## 2023-08-10 ENCOUNTER — Encounter: Payer: Self-pay | Admitting: Physical Therapy

## 2023-08-10 DIAGNOSIS — R293 Abnormal posture: Secondary | ICD-10-CM

## 2023-08-10 DIAGNOSIS — C779 Secondary and unspecified malignant neoplasm of lymph node, unspecified: Secondary | ICD-10-CM

## 2023-08-10 DIAGNOSIS — I890 Lymphedema, not elsewhere classified: Secondary | ICD-10-CM

## 2023-08-10 NOTE — Therapy (Signed)
OUTPATIENT PHYSICAL THERAPY HEAD AND NECK POST RADIATION FOLLOW UP   Patient Name: Peter [name] MRN: [id number] DOB:[birth year], 57 y.o., male Today's Date: 08/10/2023  END OF SESSION:  PT End of Session - 08/10/23 1402     Visit Number 6    Number of Visits 10    Date for PT Re-Evaluation 08/11/23    PT Start Time 1402    PT Stop Time 1455    PT Time Calculation (min) 53 min    Activity Tolerance Patient tolerated treatment well    Behavior During Therapy [facility] for tasks assessed/performed             Past Medical History:  Diagnosis Date   COVID 04/04/2021   states mild sx   Degenerative disc disease, lumbar    Dr. Shelle Iron   Dyslipidemia    GERD (gastroesophageal reflux disease)    Helicobacter pylori antibody positive 02/21/2005   treated with antibiotics   Past Surgical History:  Procedure Laterality Date   COLONOSCOPY  2013   normal   ESOPHAGOGASTRODUODENOSCOPY  2007   Dr. 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 He presented to Dr. Clovis Cao at [facility] on 01/28/23 with a left sided neck lump X 1 week. Physical exam revealed a 1 cm left level II somewhat mobile mass. No other palpable masses or enlarged were noted in the neck. 02/03/23 CT neck demonstrated a 3.1 cystic/necrotic left level IIA lymph node suspicious for  metastatic disease. No definite primary was identified in the neck. 02/18/23 FNA collected by Dr. Jenne Pane showed findings consistent with atypical dyskeratotic squamous cells suspicious for malignancy; p16 negative. Given the patient's age and presence of dyskeratotic cells, the features were noted as suspicious for squamous cell carcinoma. Of note: clear yellow fluid was aspirated from the left neck mass during FNA. 03/11/23 core biopsy of left cervical node showed findings consistent with metastatic SCC, p 16 +. 03/18/23 PET demonstrated hypermetabolism associated with the left level IIA cervical lymph node, consistent with metastatic squamous cell carcinoma. PET otherwise showed no primary head and neck malignancy identified, and no evidence of metastatic disease in the chest, abdomen, or pelvis. Symmetric hypermetabolic activity was appreciated in both tonsillar regions, however this is likely physiologic or inflammatory in etiology. 03/26/23 Consult with Dr. Lou Cal, Laryngoscopy unremarkable. She recommended another biopsy under anesthesia. 04/05/23 Biopsies of throat (tonsil, base of tongue, epiglottis) all negative.  He will receive 35 fractions of radiation to his Oropharynx and bilateral neck.  Treatment started 9/19 and will complete 11/6.    PATIENT GOALS:  Reassess how my recovery is going related to neck ROM, cervical pain, fatigue, and swelling.  PAIN:  Are you having  pain? Yes NPRS scale: 3/10 Pain location: throat Pain orientation: Bilateral  PAIN TYPE: raw Pain description: intermittent  Aggravating factors: swallowing Relieving factors: nothing  PRECAUTIONS: Recent radiation, Head and neck lymphedema risk,    OBJECTIVE:   POSTURE:  Forward head and rounded shoulders posture  30 SEC SIT TO STAND: 19 reps in 30 sec without use of UEs which is  Goodfor patient's age.  At eval: 21 reps in 30 sec without use of UEs which is  Excellent for patient's age   SHOULDER AROM:   [facility]  CERVICAL AROM:     Percent limited 07/14/23  Flexion WFL WFL  Extension [facility] WFL  Right lateral flexion [facility] WFL  Left lateral flexion WFL 25% limited  Right rotation [facility] WFL  Left rotation WFL WFL                          (Blank rows=not tested)  LYMPHEDEMA ASSESSMENT:    Circumference in cm  4 cm superior to sternal notch around neck 35.9  6 cm superior to sternal notch around neck 35.8  8 cm superior to sternal notch around neck 36.4  R lateral nostril from base of nose to medial tragus   L lateral nostril from base of nose to medial tragus   R corner of mouth to where ear lobe meets face   L corner of mouth to where ear lobe meets face   10 cm superior to sternal notch around neck 38.1     (Blank rows=not tested)  OTHER SYMPTOMS: Pain Yes Fibrosis No Pitting edema No Infections No Decreased scar mobility No  TREATMENT PERFORMED: 08/10/23: Demonstrated proper way to don Tribute night garment and had pt return demonstrate - best fit if the neck is donned first followed by strap over head Had pt demonstrate entire Norton anterior approach and provided appropriate v/c for hand placement and skin stretch as well as pressure: Instructed pt using Norton anterior approach handout as follows: short neck, 5 diaphragmatic breaths, bilateral axillary nodes (v/c for up and in), bilateral pectoral nodes, anterior chest, short neck, posterior neck moving fluid  towards pathway aimed at lateral neck, then lateral (educated pt on hand placement slightly closer to pathway for 9B)  and anterior neck (v/c to use whole hand when "milking" front of neck) moving fluid towards pathway, pre and retro auricular nodes then bilateral face and jaw all moving towards pathway in front of ear then therapist took over and worked on face and anterior neck and retraced all steps   08/05/23: Had pt demonstrate entire [facility] anterior approach and provided appropriate v/c for hand placement and skin stretch as well as pressure: Instructed pt using Norton anterior approach handout as follows: short neck, 5 diaphragmatic breaths, bilateral axillary nodes, bilateral pectoral nodes, anterior chest, short neck, posterior neck moving fluid towards pathway aimed at lateral neck, then lateral (educated pt on hand placement slightly closer to pathway for 9B) and anterior neck moving fluid towards pathway, pre and retro auricular nodes then bilateral face and jaw all moving towards pathway in front of ear then therapist took over and worked on face and anterior neck and retraced all steps   08/03/23: Sent pt instructions for ordering Tribute head and neck garment since he is having edema in his cheeks Had pt demonstrate entire Norton anterior approach and provided appropriate v/c for hand placement and skin stretch as well as pressure: Instructed pt using Norton anterior approach handout as follows: short neck, 5 diaphragmatic breaths, bilateral axillary nodes, bilateral pectoral nodes, anterior chest, short neck, posterior neck moving fluid towards pathway aimed at lateral neck, then lateral and anterior neck moving fluid towards pathway, pre and retro auricular nodes then bilateral face and jaw all moving towards pathway in front of ear then therapist took over and worked on face and anterior neck and retraced all steps   07/28/23: Assessed reynolds pack for correct fit and educated pt in proper  donning Instructed pt and spouse using Norton anterior approach handout as follows: short neck, 5 diaphragmatic breaths, bilateral axillary nodes, bilateral pectoral nodes, anterior chest, short neck, posterior neck moving fluid towards pathway aimed at lateral neck, then lateral and anterior neck moving fluid towards pathway aimed at lateral neck then retro and pre auricular nodes, face and jaw moving fluid towards pathway just anterior to ear then retracing all steps had pt return demonstrate each step and provided appropriate v/c and t/c for correct pressure and direction of skin stretch with pt demonstrating a good skin stretch by end of session  07/14/23: Created foam chip pack with thin stockinette and educated pt to use at least four hours/day Instructed pt in self MLD technique - stretching skin at anterior neck downwards, provided v/c and t/c for pressure and direction of stretch  PATIENT EDUCATION:  Education details: lymphedema and how it is managed through self MLD and a compression garment, chip pack at least 4 hours a day, staying active Person educated: Patient and Spouse Education method: Explanation Education comprehension: verbalized understanding  HOME EXERCISE PROGRAM: Reviewed previously given post op HEP. Wear chip pack at least 4 hours/day Basic MLD skin stretch technique at anterior neck  ASSESSMENT:  CLINICAL IMPRESSION: Assessed fit of Tribute head and neck garment. Educated pt in proper garment and had him demonstrate independence in donning. Had pt demonstrate entire sequence using handout. Pt continues to demonstrate good skin stretch with minimal cues on certain steps for hand placement and pressure. Overall pt is progressing towards independence with this. Will assess how garment worked for him at next session and will plan to d/c in the next session or two.   Pt will benefit from skilled therapeutic intervention to improve on the following deficits: decreased  knowledge  of condition, decreased knowledge of use of DME, increased edema, increased fascial restrictions, and postural dysfunction  PT treatment/interventions: ADL/Self care home management, [id number]- PT Re-evaluation, 97110-Therapeutic exercises, 97530- Therapeutic activity, 97535- Self Care, [id number]- Manual therapy, 97760- Orthotic Fit/training, Manual lymph drainage, and Compression bandaging    GOALS: Goals reviewed with patient? Yes  LONG TERM GOALS:  (STG=LTG) GOALS Name Target Date  Goal status  1 Pt will demonstrate a return to baseline cervical ROM measurements and not demonstrate any signs or symptoms of lymphedema. Baseline: 08/11/23 NEW  2 Pt will be independent in self MLD technique for long term management of lymphedema. 08/11/23 NEW  3 Pt will obtain a compression garment for long term management of lymphedema. 08/11/23 NEW        PLAN:  PT FREQUENCY/DURATION: 2x/wk for 4 wks  PLAN FOR NEXT SESSION: continue instruct in norton anterior approach MLD technique,  how is face swelling with new garment?   [facility]  [address], [address]  [place] [address]  [phone]   Scar massage You can begin gentle scar massage to you incision sites. Gently place one hand on the incision and move the skin (without sliding on the skin) in various directions. Do this for a few minutes and then you can gently massage either coconut oil or vitamin E cream into the scars.  Home exercise Program Continue doing the exercises you were given until you feel like you can do them without feeling any tightness at the end. It is best to do them for several months after completion of radiation since the effects of radiation continue past completion.   Walking Program Studies show that 30 minutes of walking per day (fast enough to elevate your heart rate) can significantly reduce the risk of a cancer recurrence. If you can't walk due to other medical reasons, we  encourage you to find another activity you could do (like a stationary bike or water exercise).  Posture After treatment for head and neck cancer, people frequently sit with rounded shoulders and forward head posture because the front of the neck has become tight and it feels better. If you sit like this, you can become very tight and have pain in sitting or standing with good posture. Try to be aware of your posture and sit and stand up tall to heal properly.  Follow up PT: Please let you doctor know as soon as possible if you develop any swelling in your face or neck in the future. Lymphedema (swelling) can occur months after completion of radiation. The sooner you can let the doctor know, the sooner they can refer you back to PT. It is much easier to treat the swelling early on.   Milagros Loll [place], PT 08/10/2023, 2:57 PM

## 2023-08-11 ENCOUNTER — Ambulatory Visit: Payer: BC Managed Care – PPO | Attending: Radiation Oncology

## 2023-08-11 DIAGNOSIS — R1310 Dysphagia, unspecified: Secondary | ICD-10-CM | POA: Insufficient documentation

## 2023-08-11 NOTE — Therapy (Signed)
OUTPATIENT SPEECH LANGUAGE PATHOLOGY ONCOLOGY TREATMENT   Patient Name: Peter [name] MRN: [id number] DOB:04/06/1966, 57 y.o., male Today's Date: 08/11/2023  PCP: Cheryll Cockayne, MD REFERRING PROVIDER: Lonie Peak, MD  END OF SESSION:  End of Session - 08/11/23 1409     Visit Number 3    Number of Visits 7    Date for SLP Re-Evaluation 08/25/23    SLP Start Time 1406    SLP Stop Time  1433    SLP Time Calculation (min) 27 min    Activity Tolerance Patient tolerated treatment well             Past Medical History:  Diagnosis Date   COVID 04/04/2021   states mild sx   Degenerative disc disease, lumbar    Dr. Shelle Iron   Dyslipidemia    GERD (gastroesophageal reflux disease)    Helicobacter pylori antibody positive 02/21/2005   treated with antibiotics   Past Surgical History:  Procedure Laterality Date   COLONOSCOPY  2013   normal   ESOPHAGOGASTRODUODENOSCOPY  2007   Dr. 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 Hx: He presented to Dr. Clovis Cao at [facility] on 01/28/23 with a left  sided neck lump X 1 week. Physical exam revealed a 1 cm left level II somewhat mobile mass. No other palpable masses or enlarged were noted in the neck. 02/03/23 CT neck demonstrated a 3.1 cystic/necrotic left level IIA lymph node suspicious for metastatic disease. No definite primary was identified in the neck. 02/18/23 FNA collected by Dr. Jenne Pane showed findings consistent with atypical dyskeratotic squamous cells suspicious for malignancy; p16 negative. Given the patient's age and presence of dyskeratotic cells, the features were noted as suspicious for squamous cell carcinoma. Of note: clear yellow fluid was aspirated from the left neck mass during FNA. 03/11/23 core biopsy of left cervical node showed findings consistent with metastatic SCC, p 16 +. 03/18/23 PET demonstrated hypermetabolism associated with the left level IIA cervical lymph node, consistent with metastatic squamous cell carcinoma. PET otherwise showed no primary head and neck malignancy identified, and no evidence of metastatic disease in the chest, abdomen, or pelvis. Symmetric hypermetabolic activity was appreciated in both tonsillar regions, however this is likely physiologic or inflammatory in etiology. 03/26/23 Consult with Dr. Lou Cal, Laryngoscopy unremarkable. She recommended another biopsy under anesthesia. 04/05/23 Biopsies of throat (tonsil, base of tongue, epiglottis) all negative.  04/06/23 Consult with Dr. Basilio Cairo, 04/15/23 Consult with Dr. Truett Perna. He will receive radiation only. Treatment plan:  He will receive 35 fractions of radiation to his Oropharynx and bilateral neck.Treatment started 9/19 and will complete 11/6  PAIN:  Are you  having pain? Yes: NPRS scale: 4/10 Pain location: throat Pain description: sore Aggravating factors: only when swallowing Relieving factors: meds  FALLS: Has patient fallen in last 6 months?  No  PATIENT GOALS: Maintain WNL swallowing  OBJECTIVE:  Note: Objective measures were completed at  Evaluation unless otherwise noted.   TODAY'S TREATMENT:                                                                                                                                         DATE:   08/11/23: Pt tried sweet potato and this traveled well through pharynx. Fried egg did not have as much success. SLP educated on soft, mushy, slick foods are those which are going to be best currently. Re-educated about food journal as pt had been keeping one of these prior to going to liquids only. SLP provided pt with overt s/sx aspiration PNA and demonstrated understanding by telling SLP s/sx. Today pt had 1/2 teaspoons of applesauce x2 with stinging/burning after two boluses; No overt s/sx oral or pharyngeal deficits with this or sips of water x8. SLP suggested some dys I-II items for pt to try.  With HEP pt began doing this again approx 2 weeks ago, up to then was at suboptimal frequency and scope. Today he was independent with procedure and SLP told him to cont with prescribed scope and frequency. He agreed.    07/08/23: Pt performed two reps of each exercise on HEP with initial cue to protrude tongue further. Independent by session end.  Pt is swallowing POs of Boost VHC, and water. Does not reports notable episodes of coughing at this time. He drank water today without any signs oral difficulty, nor any s/sx (overt) of pharyngeal deficits. Pt told SLP rationale for HEP and for food journal.   05/27/23 (eval): Research states the risk for dysphagia increases due to radiation and/or chemotherapy treatment due to a variety of factors, so SLP educated the pt about the possibility of reduced/limited ability for PO intake during rad tx. SLP also educated pt regarding possible changes to swallowing musculature after rad tx, and why adherence to dysphagia HEP provided today and PO consumption was necessary to inhibit muscle fibrosis following rad tx and to mitigate muscle disuse atrophy. SLP informed pt why  this would be detrimental to their swallowing status and to their pulmonary health. Pt demonstrated understanding of these things to SLP. SLP encouraged pt to safely eat and drink as deep into their radiation/chemotherapy as possible to provide the best possible long-term swallowing outcome for pt.  SLP then developed an individualized HEP for pt involving oral and pharyngeal strengthening and ROM and pt was instructed how to perform these exercises, including SLP demonstration. After SLP demonstration, pt return demonstrated each exercise. SLP ensured pt performance was correct prior to educating pt on next exercise. Pt required min cues faded to modified independent to perform HEP. Pt  was instructed to complete this program 6-7 days/week, at least 2 times a day until 6 months after his or her last day of rad tx, and then x2 a week after that, indefinitely. Among other modifications for days when pt cannot functionally swallow, SLP also suggested pt to perform only non-swallowing tasks on the handout/HEP, and if necessary to cycle through the swallowing portion so the full program of exercises can be completed instead of fatiguing on one of the swallowing exercises and being unable to perform the other swallowing exercises. SLP instructed that swallowing exercises should then be added back into the regimen as pt is able to do so. Secondly, pt was told that former patients have told SLP that during their course of radiation therapy, taking prescribed pain medication just prior to performing HEP (and eating/drinking) has proven helpful in completing HEP (and eating and drinking) more regularly when going through their course of radiation treatment.    PATIENT EDUCATION: Education details: HEP procedure Person educated: Patient and Spouse Education method: Explanation, Demonstration, and Verbal cues Education comprehension: verbalized understanding, returned demonstration, verbal cues required, and needs  further education   ASSESSMENT:  CLINICAL IMPRESSION: Patient is a 57 y.o. M who was seen today for treatment of swallowing after radiation/chemoradiation therapy. Today pt ate items from Dys I and drank thin liquids, and grimaced with swallowing each sip. At this time pt swallowing is deemed WNL/WFL with these POs. No oral or overt s/sx pharyngeal deficits, including aspiration were observed. There are no overt s/s aspiration PNA observed by SLP nor any reported by pt at this time. Data indicate that pt's swallow ability will likely decrease over the course of radiation/chemoradiation therapy and could very well decline over time following the conclusion of that therapy due to muscle disuse atrophy and/or muscle fibrosis. Pt will cont to need to be seen by SLP in order to assess safety of PO intake, assess the need for recommending any objective swallow assessment, and ensuring pt is correctly completing the individualized HEP.  OBJECTIVE IMPAIRMENTS: include voice disorder and dysphagia. These impairments are limiting patient from effectively communicating at home and in community and safety when swallowing. Factors affecting potential to achieve goals and functional outcome are  none noted . Patient will benefit from skilled SLP services to address above impairments and improve overall function.   REHAB POTENTIAL: Good   GOALS: Goals reviewed with patient? No   SHORT TERM GOALS: Target: 3rd total session   1. Pt will complete HEP with modified independence in 2 sessions Baseline:07/08/23 Goal status: INITIAL   2.  pt will tell SLP why pt is completing HEP with modified independence Baseline:  Goal status: met   3.  pt will describe 3 overt s/s aspiration PNA with modified independence Baseline:  Goal status: met   4.  pt will tell SLP how a food journal could hasten return to a more normalized diet Baseline:  Goal status: met     LONG TERM GOALS: Target: 7th total session   1.   pt will complete HEP with independence over two visits Baseline:  Goal status: INITIAL   2.  pt will describe how to modify HEP over time, and the timeline associated with reduction in HEP frequency with modified independence over two sessions Baseline:  Goal status: INITIAL   PLAN:   SLP FREQUENCY:  once approx every 4 weeks   SLP DURATION:  7 sessions   PLANNED INTERVENTIONS: Aspiration precaution training, Pharyngeal strengthening exercises, Diet  toleration management , Trials of upgraded texture/liquids, SLP instruction and feedback, Compensatory strategies, and Patient/family education     [facility], CCC-SLP 08/11/2023, 11:31 PM

## 2023-08-11 NOTE — Patient Instructions (Signed)
   Signs of Aspiration Pneumonia   Chest pain/tightness Fever (can be low grade) Cough  With foul-smelling phlegm (sputum) With sputum containing pus or blood With greenish sputum Fatigue  Shortness of breath  Wheezing   **IF YOU HAVE THESE SIGNS, CONTACT YOUR DOCTOR OR GO TO THE EMERGENCY DEPARTMENT OR URGENT CARE AS SOON AS POSSIBLE**     

## 2023-08-12 ENCOUNTER — Encounter: Payer: Self-pay | Admitting: Physical Therapy

## 2023-08-12 ENCOUNTER — Ambulatory Visit: Payer: BC Managed Care – PPO | Admitting: Physical Therapy

## 2023-08-12 DIAGNOSIS — I890 Lymphedema, not elsewhere classified: Secondary | ICD-10-CM

## 2023-08-12 DIAGNOSIS — R293 Abnormal posture: Secondary | ICD-10-CM | POA: Diagnosis not present

## 2023-08-12 DIAGNOSIS — C779 Secondary and unspecified malignant neoplasm of lymph node, unspecified: Secondary | ICD-10-CM

## 2023-08-12 NOTE — Therapy (Addendum)
 OUTPATIENT PHYSICAL THERAPY HEAD AND NECK POST RADIATION FOLLOW UP   Patient Name: GEMINI BLANCHETT MRN: [id number] DOB:1966-04-16, 57 y.o., male Today's Date: 08/12/2023  END OF SESSION:  PT End of Session - 08/12/23 1450     Visit Number 7    Number of Visits 10    Date for PT Re-Evaluation 08/11/23    PT Start Time 1449    PT Stop Time 1545    PT Time Calculation (min) 56 min    Activity Tolerance Patient tolerated treatment well    Behavior During Therapy [facility] for tasks assessed/performed             Past Medical History:  Diagnosis Date   COVID 04/04/2021   states mild sx   Degenerative disc disease, lumbar    Dr. Shelle Iron   Dyslipidemia    GERD (gastroesophageal reflux disease)    Helicobacter pylori antibody positive 02/21/2005   treated with antibiotics   Past Surgical History:  Procedure Laterality Date   COLONOSCOPY  2013   normal   ESOPHAGOGASTRODUODENOSCOPY  2007   Dr. 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 I switch between the tribute and the reynolds. I feel like the jaw is a tad down. Today does not look at good as yesterday. When I wear the tribute for a while and take it off you can even tell I have swelling.   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 He presented to Dr. Clovis Cao at  [facility] on 01/28/23 with a left sided neck lump X 1 week. Physical exam revealed a 1 cm left level II somewhat mobile mass. No other palpable masses or enlarged were noted in the neck. 02/03/23 CT neck demonstrated a 3.1 cystic/necrotic left level IIA lymph node suspicious for metastatic disease. No definite primary was identified in the neck. 02/18/23 FNA collected by Dr. Jenne Pane showed findings consistent with atypical dyskeratotic squamous cells suspicious for malignancy; p16 negative. Given the patient's age and presence of dyskeratotic cells, the features were noted as suspicious for squamous cell carcinoma. Of note: clear yellow fluid was aspirated from the left neck mass during FNA. 03/11/23 core biopsy of left cervical node showed findings consistent with metastatic SCC, p 16 +. 03/18/23 PET demonstrated hypermetabolism associated with the left level IIA cervical lymph node, consistent with metastatic squamous cell carcinoma. PET otherwise showed no primary head and neck malignancy identified, and no evidence of metastatic disease in the chest, abdomen, or pelvis. Symmetric hypermetabolic activity was appreciated in both tonsillar regions, however this is likely physiologic or inflammatory in etiology. 03/26/23 Consult with Dr. Lou Cal, Laryngoscopy unremarkable. She recommended another biopsy under anesthesia. 04/05/23 Biopsies of throat (tonsil, base of tongue, epiglottis) all negative.  He will receive 35 fractions of  radiation to his Oropharynx and bilateral neck.  Treatment started 9/19 and will complete 11/6.    PATIENT GOALS:  Reassess how my recovery is going related to neck ROM, cervical pain, fatigue, and swelling.  PAIN:  Are you having pain? Yes NPRS scale: 3/10 Pain location: throat Pain orientation: Bilateral  PAIN TYPE: raw Pain description: intermittent  Aggravating factors: swallowing Relieving factors: nothing  PRECAUTIONS: Recent radiation, Head and neck lymphedema risk,    OBJECTIVE:   POSTURE:  Forward head and rounded shoulders posture  30 SEC SIT TO STAND: 19 reps in 30 sec without use of UEs which is  Goodfor patient's age.  At eval: 21 reps in 30 sec without use of UEs which is  Excellent for patient's age   SHOULDER AROM:   [facility]  CERVICAL AROM:     Percent limited 07/14/23 08/12/23  Flexion WFL WFL   Extension [facility] WFL   Right lateral flexion [facility] WFL   Left lateral flexion WFL 25% limited WFL  Right rotation [facility] WFL   Left rotation WFL WFL                           (Blank rows=not tested)  LYMPHEDEMA ASSESSMENT:    Circumference in cm  4 cm superior to sternal notch around neck 35.9  6 cm superior to sternal notch around neck 35.8  8 cm superior to sternal notch around neck 36.4  R lateral nostril from base of nose to medial tragus   L lateral nostril from base of nose to medial tragus   R corner of mouth to where ear lobe meets face   L corner of mouth to where ear lobe meets face   10 cm superior to sternal notch around neck 38.1     (Blank rows=not tested)  OTHER SYMPTOMS: Pain Yes Fibrosis No Pitting edema No Infections No Decreased scar mobility No  TREATMENT PERFORMED: 08/12/23:  Completed entire Norton anterior approach using Norton anterior approach handout as follows: short neck, 5 diaphragmatic breaths, bilateral axillary nodes, bilateral pectoral nodes, anterior chest, short neck, posterior neck moving fluid towards pathway aimed at  lateral neck, then lateral and anterior  neck moving fluid towards pathway, pre and retro auricular nodes then bilateral face and jaw all moving towards pathway in front of ear then therapist took over and worked on face and anterior neck and retraced all steps  Had pt don Tribute night garment and gave v/c to velcro top strap directly over head to increase comfort and pt reported improved comfort with this  08/10/23: Demonstrated proper way to don Tribute night garment and had pt return demonstrate - best fit if the neck is donned first followed by strap over head Had pt demonstrate entire Norton anterior approach and provided appropriate v/c for hand placement and skin stretch as well as pressure: Instructed pt using Norton anterior approach handout as follows: short neck, 5 diaphragmatic breaths, bilateral axillary nodes (v/c for up and in), bilateral pectoral nodes, anterior chest, short neck, posterior neck moving fluid towards pathway aimed at lateral neck, then lateral (educated pt on hand placement slightly closer to pathway for 9B) and anterior neck (v/c to use whole hand when "milking" front of neck) moving fluid towards pathway, pre and retro auricular nodes then bilateral face and jaw all moving towards pathway in front of ear then therapist took over and worked on face and anterior neck and retraced all steps   08/05/23: Had pt demonstrate entire Norton anterior approach and provided appropriate v/c for hand placement and skin stretch as well as pressure: Instructed pt using Norton anterior approach handout as follows: short neck, 5 diaphragmatic breaths, bilateral axillary nodes, bilateral pectoral nodes, anterior chest, short neck, posterior neck moving fluid towards pathway aimed at lateral neck, then lateral (educated pt on hand placement slightly closer to pathway for 9B) and anterior neck moving fluid towards pathway, pre and retro auricular nodes then bilateral face and jaw all moving  towards pathway in front of ear then therapist took over and worked on face and anterior neck and retraced all steps   08/03/23: Sent pt instructions for ordering Tribute head and neck garment since he is having edema in his cheeks Had pt demonstrate entire Norton anterior approach and provided appropriate v/c for hand placement and skin stretch as well as pressure: Instructed pt using Norton anterior approach handout as follows: short neck, 5 diaphragmatic breaths, bilateral axillary nodes, bilateral pectoral nodes, anterior chest, short neck, posterior neck moving fluid towards pathway aimed at lateral neck, then lateral and anterior neck moving fluid towards pathway, pre and retro auricular nodes then bilateral face and jaw all moving towards pathway in front of ear then therapist took over and worked on face and anterior neck and retraced all steps   07/28/23: Assessed reynolds pack for correct fit and educated pt in proper donning Instructed pt and spouse using Norton anterior approach handout as follows: short neck, 5 diaphragmatic breaths, bilateral axillary nodes, bilateral pectoral nodes, anterior chest, short neck, posterior neck moving fluid towards pathway aimed at lateral neck, then lateral and anterior neck moving fluid towards pathway aimed at lateral neck then retro and pre auricular nodes, face and jaw moving fluid towards pathway just anterior to ear then retracing all steps had pt return demonstrate each step and provided appropriate v/c and t/c for correct pressure and direction of skin stretch with pt demonstrating a good skin stretch by end of session  07/14/23: Created foam chip pack with thin stockinette and educated pt to use at least four hours/day Instructed pt in self MLD technique - stretching skin at anterior neck downwards, provided v/c and t/c for pressure  and direction of stretch  PATIENT EDUCATION:  Education details: lymphedema and how it is managed through self MLD  and a compression garment, chip pack at least 4 hours a day, staying active Person educated: Patient and Spouse Education method: Explanation Education comprehension: verbalized understanding  HOME EXERCISE PROGRAM: Reviewed previously given post op HEP. Wear chip pack at least 4 hours/day Basic MLD skin stretch technique at anterior neck  ASSESSMENT:  CLINICAL IMPRESSION: Pt has now met all goals for therapy. He demonstrates independence with self MLD and has two garment that he can alternate between to manage his lymphedema. He will be discharged from skilled PT services at this time.   Pt will benefit from skilled therapeutic intervention to improve on the following deficits: decreased knowledge of condition, decreased knowledge of use of DME, increased edema, increased fascial restrictions, and postural dysfunction  PT treatment/interventions: ADL/Self care home management, [phone]- PT Re-evaluation, 97110-Therapeutic exercises, 97530- Therapeutic activity, 97535- Self Care, 29562- Manual therapy, 97760- Orthotic Fit/training, Manual lymph drainage, and Compression bandaging    GOALS: Goals reviewed with patient? Yes  LONG TERM GOALS:  (STG=LTG) GOALS Name Target Date  Goal status  1 Pt will demonstrate a return to baseline cervical ROM measurements and not demonstrate any signs or symptoms of lymphedema. Baseline: 08/11/23 MET 08/12/23  2 Pt will be independent in self MLD technique for long term management of lymphedema. 08/11/23 MET 08/12/23  3 Pt will obtain a compression garment for long term management of lymphedema. 08/11/23 MET 08/12/23        PLAN:  PT FREQUENCY/DURATION: 2x/wk for 4 wks  PLAN FOR NEXT SESSION:d/c this session   [facility]  [address], [address]  [place] [address]  [phone]   Scar massage You can begin gentle scar massage to you incision sites. Gently place one hand on the incision and move the skin (without  sliding on the skin) in various directions. Do this for a few minutes and then you can gently massage either coconut oil or vitamin E cream into the scars.  Home exercise Program Continue doing the exercises you were given until you feel like you can do them without feeling any tightness at the end. It is best to do them for several months after completion of radiation since the effects of radiation continue past completion.   Walking Program Studies show that 30 minutes of walking per day (fast enough to elevate your heart rate) can significantly reduce the risk of a cancer recurrence. If you can't walk due to other medical reasons, we encourage you to find another activity you could do (like a stationary bike or water exercise).  Posture After treatment for head and neck cancer, people frequently sit with rounded shoulders and forward head posture because the front of the neck has become tight and it feels better. If you sit like this, you can become very tight and have pain in sitting or standing with good posture. Try to be aware of your posture and sit and stand up tall to heal properly.  Follow up PT: Please let you doctor know as soon as possible if you develop any swelling in your face or neck in the future. Lymphedema (swelling) can occur months after completion of radiation. The sooner you can let the doctor know, the sooner they can refer you back to PT. It is much easier to treat the swelling early on.   [facility], PT 08/12/2023, 3:52 PM  PHYSICAL THERAPY  DISCHARGE SUMMARY  Visits from Start of Care: 7  Current functional level related to goals / functional outcomes: All goals met   Remaining deficits: None   Education / Equipment: HEP, lymphedema, compression garments   Patient agrees to discharge. Patient goals were met. Patient is being discharged due to meeting the stated rehab goals.  Milagros Loll [place], [place] 08/12/23 3:56 PM

## 2023-08-16 ENCOUNTER — Other Ambulatory Visit: Payer: Self-pay | Admitting: Radiation Oncology

## 2023-08-16 DIAGNOSIS — C779 Secondary and unspecified malignant neoplasm of lymph node, unspecified: Secondary | ICD-10-CM

## 2023-08-16 DIAGNOSIS — C770 Secondary and unspecified malignant neoplasm of lymph nodes of head, face and neck: Secondary | ICD-10-CM

## 2023-08-16 MED ORDER — HYDROCODONE-ACETAMINOPHEN 5-325 MG PO TABS: 1.0000 | ORAL_TABLET | Freq: Three times a day (TID) | ORAL | 0 refills | Status: DC | PRN

## 2023-08-23 ENCOUNTER — Encounter: Payer: BC Managed Care – PPO | Admitting: Physical Therapy

## 2023-08-24 ENCOUNTER — Telehealth: Payer: BC Managed Care – PPO | Admitting: Physician Assistant

## 2023-08-24 DIAGNOSIS — H6992 Unspecified Eustachian tube disorder, left ear: Secondary | ICD-10-CM | POA: Insufficient documentation

## 2023-08-24 DIAGNOSIS — H01001 Unspecified blepharitis right upper eyelid: Secondary | ICD-10-CM

## 2023-08-24 MED ORDER — ERYTHROMYCIN 5 MG/GM OP OINT: 1.0000 | TOPICAL_OINTMENT | Freq: Every day | OPHTHALMIC | 0 refills | Status: AC

## 2023-08-24 NOTE — Patient Instructions (Signed)
 Peter [name] Peter [name], thank you for joining Delon CHRISTELLA Dickinson, PA-C for today's virtual visit.  While this provider is not your primary care provider (PCP), if your PCP is located in our provider database this encounter information will be shared with them immediately following your visit.   A [facility] MyChart account gives you access to today's visit and all your visits, tests, and labs performed at [facility]  click here if you don't have a [facility] MyChart account or go to [email or web address]  Consent: (Patient) Peter [name] provided verbal consent for this virtual visit at the beginning of the encounter.  Current Medications:  Current Outpatient Medications:    erythromycin  ophthalmic ointment, Place 1 Application into the right eye at bedtime for 5 days., Disp: 5 g, Rfl: 0   acetaminophen  (TYLENOL ) 500 MG tablet, Take 1,000 mg by mouth every 6 (six) hours as needed., Disp: , Rfl:    famotidine (PEPCID) 20 MG tablet, Take 20 mg by mouth as needed for heartburn or indigestion., Disp: , Rfl:    fluconazole  (DIFLUCAN ) 100 MG tablet, Take 2 tablets today, then 1 tablet daily x 6 more days., Disp: 8 tablet, Rfl: 0   HYDROcodone -acetaminophen  (NORCO/VICODIN) 5-325 MG tablet, Take 1 tablet by mouth 3 (three) times daily as needed for moderate pain (pain score 4-6). May constipate - use Miralax as needed for constipation., Disp: 40 tablet, Rfl: 0   ibuprofen (ADVIL) 400 MG tablet, Take 400 mg by mouth every 6 (six) hours as needed., Disp: , Rfl:    lidocaine  (XYLOCAINE ) 2 % solution, Patient: Mix 1part 2% viscous lidocaine , 1part H20. Swish & swallow 10mL of diluted mixture, before meals and at bedtime, up to QID, Disp: 200 mL, Rfl: 3   ondansetron  (ZOFRAN ) 8 MG tablet, Take 1 tablet (8 mg total) by mouth every 8 (eight) hours as needed for nausea or vomiting., Disp: 20 tablet, Rfl: 2   sucralfate  (CARAFATE ) 1 g tablet, Dissolve 1 tablet in 10 mL H20 and swallow 30 min  prior to meals and bedtime. Swish in mouth first, if needed for mouth sores. Use up to 4 times daily., Disp: 40 tablet, Rfl: 3   Medications ordered in this encounter:  Meds ordered this encounter  Medications   erythromycin  ophthalmic ointment    Sig: Place 1 Application into the right eye at bedtime for 5 days.    Dispense:  5 g    Refill:  0    Supervising Provider:   BLAISE ALEENE KIDD [[id number]]     *If you need refills on other medications prior to your next appointment, please contact your pharmacy*  Follow-Up: Call back or seek an in-person evaluation if the symptoms worsen or if the condition fails to improve as anticipated.  [facility] [phone]  Other Instructions Blepharitis Blepharitis refers to inflammation of the eyelids. It is a common condition and can cause dryness or grittiness in the eyes. Other symptoms may include: Reddish, scaly skin around the scalp and eyebrows. Burning or itching of the eyelids. Eye discharge at night that causes the eyelashes to stick together in the morning. Eyelashes that fall out. Redness of the eyes. Sensitivity to light. Follow these instructions at home: Pay attention to any changes in how your eyes look or feel. Tell your health care provider about any changes. Follow these instructions to help with your condition. Keeping clean Wash your hands often with soap and water for at least 20 seconds. Clean your  eyes and wash the edges of your eyelids with diluted baby shampoo or commercial eyelid wipes. Do this 2 or more times a day. Wash your face and eyebrows at least once a day. Use a clean towel each time you dry your eyelids. Do not use this towel to clean or dry other areas of your body. Do not share your towel with anyone. General instructions Avoid wearing makeup until you get better. Do not share makeup with anyone. Avoid rubbing your eyes. Use warm compresses on the eyes for 5-10 minutes. Do this 1 or 2  times a day, or as told by your health care provider. You can use warm water on a towel, but a microwaveable heating pad often stays warm longer. The pad should be very warm but not hot enough to burn the skin. If you were prescribed an antibiotic ointment or steroid drops, apply or use the medicine as told by your health care provider. Do not stop using the medicine even if you feel better. Keep all follow-up visits. This is important. Contact a health care provider if: Your eyelids feel hot. You have blisters or a rash on your eyelids. The inflammation gets worse or does not go away in 2-4 days. Get help right away if: You have pain or redness that gets worse or spreads to other parts of your face. Your vision changes. You have pain when looking at lights or moving objects. You have a fever. Summary Blepharitis refers to inflammation of the eyelids. It can cause dryness and grittiness in the eyes. Pay attention to any changes in how your eyes look or feel. Tell your health care provider about any changes. Follow home care instructions as told by your health care provider. Wash your hands often with soap and water for at least 20 seconds. Avoid wearing makeup. Do not rub your eyes. If you were prescribed an antibiotic ointment or steroid drops, apply or use the medicine as told by your health care provider. Get help right away if you have a fever, vision changes, pain or redness that gets worse or spreads to other parts of your face, or pain when looking at lights or moving objects. This information is not intended to replace advice given to you by your health care provider. Make sure you discuss any questions you have with your health care provider. Document Revised: 09/11/2020 Document Reviewed: 09/11/2020 Elsevier Patient Education  2024 [employer]    If you have been instructed to have an in-person evaluation today at a local Urgent Care facility, please use the link below. It will  take you to a list of all of our available [facility], including address, phone number and hours of operation. Please do not delay care.  [facility]  If you or a family member do not have a primary care provider, use the link below to schedule a visit and establish care. When you choose a [facility] primary care physician or advanced practice provider, you gain a long-term partner in health. Find a Primary Care Provider  Learn more about [facility]'s in-office and virtual care options:  - Get Care Now

## 2023-08-24 NOTE — Progress Notes (Signed)
 Virtual Visit Consent   Peter [name], you are scheduled for a virtual visit with a [facility] provider today. Just as with appointments in the office, your consent must be obtained to participate. Your consent will be active for this visit and any virtual visit you may have with one of our providers in the next 365 days. If you have a MyChart account, a copy of this consent can be sent to you electronically.  As this is a virtual visit, video technology does not allow for your provider to perform a traditional examination. This may limit your provider's ability to fully assess your condition. If your provider identifies any concerns that need to be evaluated in person or the need to arrange testing (such as labs, EKG, etc.), we will make arrangements to do so. Although advances in technology are sophisticated, we cannot ensure that it will always work on either your end or our end. If the connection with a video visit is poor, the visit may have to be switched to a telephone visit. With either a video or telephone visit, we are not always able to ensure that we have a secure connection.  By engaging in this virtual visit, you consent to the provision of healthcare and authorize for your insurance to be billed (if applicable) for the services provided during this visit. Depending on your insurance coverage, you may receive a charge related to this service.  I need to obtain your verbal consent now. Are you willing to proceed with your visit today? Peter [name] has provided verbal consent on 08/24/2023 for a virtual visit (video or telephone). Peter CHRISTELLA Dickinson, PA-C  Date: 08/24/2023 10:41 AM  Virtual Visit via Video Note   I, Peter [name], connected with  Peter [name]  ([id number], Mar 07, 1966) on 08/24/23 at 10:30 AM EST by a video-enabled telemedicine application and verified that I am speaking with the correct person using two identifiers.  Location: Patient: Virtual Visit  Location Patient: Home Provider: Virtual Visit Location Provider: Home Office   I discussed the limitations of evaluation and management by telemedicine and the availability of in person appointments. The patient expressed understanding and agreed to proceed.    History of Present Illness: Peter [name] is a 57 y.o. who identifies as a male who was assigned male at birth, and is being seen today for swelling and itching of eyelid.  HPI: Eye Problem  The right eye is affected. This is a new problem. The current episode started in the past 7 days (Saturday, 08/21/23). The problem occurs constantly. The problem has been gradually worsening. There was no injury mechanism. There is No known exposure to pink eye. He Does not wear contacts. Associated symptoms include an eye discharge, eye redness and itching. Pertinent negatives include no blurred vision, double vision, fever, foreign body sensation, photophobia or recent URI. Treatments tried: warm compress. The treatment provided no relief.     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) 03/23/2023   High coronary artery calcium  score 06/14/2017   Dyslipidemia 06/14/2017    Allergies: No Known Allergies Medications:  Current Outpatient Medications:    erythromycin  ophthalmic ointment, Place 1 Application into the right eye at bedtime  for 5 days., Disp: 5 g, Rfl: 0   acetaminophen  (TYLENOL ) 500 MG tablet, Take 1,000 mg by mouth every 6 (six) hours as needed., Disp: , Rfl:    famotidine (PEPCID) 20 MG tablet, Take 20 mg by mouth as needed for heartburn or indigestion., Disp: , Rfl:    fluconazole  (DIFLUCAN ) 100 MG tablet, Take 2 tablets today, then  1 tablet daily x 6 more days., Disp: 8 tablet, Rfl: 0   HYDROcodone -acetaminophen  (NORCO/VICODIN) 5-325 MG tablet, Take 1 tablet by mouth 3 (three) times daily as needed for moderate pain (pain score 4-6). May constipate - use Miralax as needed for constipation., Disp: 40 tablet, Rfl: 0   ibuprofen (ADVIL) 400 MG tablet, Take 400 mg by mouth every 6 (six) hours as needed., Disp: , Rfl:    lidocaine  (XYLOCAINE ) 2 % solution, Patient: Mix 1part 2% viscous lidocaine , 1part H20. Swish & swallow 10mL of diluted mixture, before meals and at bedtime, up to QID, Disp: 200 mL, Rfl: 3   ondansetron  (ZOFRAN ) 8 MG tablet, Take 1 tablet (8 mg total) by mouth every 8 (eight) hours as needed for nausea or vomiting., Disp: 20 tablet, Rfl: 2   sucralfate  (CARAFATE ) 1 g tablet, Dissolve 1 tablet in 10 mL H20 and swallow 30 min prior to meals and bedtime. Swish in mouth first, if needed for mouth sores. Use up to 4 times daily., Disp: 40 tablet, Rfl: 3  Observations/Objective: Patient is well-developed, well-nourished in no acute distress.  Resting comfortably at home.  Head is normocephalic, atraumatic.  No labored breathing.  Speech is clear and coherent with logical content.  Patient is alert and oriented at baseline.    Assessment and Plan: 1. Blepharitis of right upper eyelid, unspecified type (Primary) - erythromycin  ophthalmic ointment; Place 1 Application into the right eye at bedtime for 5 days.  Dispense: 5 g; Refill: 0  - Suspect blepharitis of the right upper eyelid - Erythromycin  prescribed - Warm compresses - Good hand hygiene - Seek in person evaluation if symptoms worsen or fail to improve   Follow Up Instructions: I discussed the assessment and treatment plan with the patient. The patient was provided an opportunity to ask questions and all were answered. The patient agreed with the plan and demonstrated an understanding of the instructions.  A copy of instructions were sent to the  patient via MyChart unless otherwise noted below.    The patient was advised to call back or seek an in-person evaluation if the symptoms worsen or if the condition fails to improve as anticipated.    Peter CHRISTELLA Dickinson, PA-C

## 2023-08-26 ENCOUNTER — Encounter: Payer: BC Managed Care – PPO | Admitting: Physical Therapy

## 2023-09-08 ENCOUNTER — Ambulatory Visit: Payer: BC Managed Care – PPO

## 2023-09-14 ENCOUNTER — Inpatient Hospital Stay: Payer: BC Managed Care – PPO | Attending: Oncology | Admitting: Nutrition

## 2023-09-14 NOTE — Progress Notes (Signed)
Nutrition follow-up via telephone.  Patient is status post radiation therapy for nasopharyngeal cancer.  He completed treatment on November 6.  Patient does not have a feeding tube.  Last weight documented was 160 pounds 6 ounces November 20.   Patient reported weight on home scale was 157 pounds on December 16.  Current weight is 157 pounds.  Patient previously drinking 4 boost very high-calorie every day and tolerated bites of potatoes or ice cream.  Reports oral intake improved last week and he provided the following dietary recall.  Friday: 4 VHC, Large sweet potato, half chicken breast.  Saturday: 3 VHC, 1 regular Boost, 2 eggs, sweet potato, chicken, cherry graham cracker pudding. Sunday: 2 VHC, 1 regular Boost, 2 eggs, sweet potato, cherry graham cracker pudding, pintos, mashed potatoes, 1.5 salmon cakes. Monday: 3 VHC, 1 regular boost, 2 eggs, cherry graham cracker pudding, mashed potatoes, carrots, roast, 3 fig Newtons. Tuesday: 2 eggs, oatmeal, cup of soy milk for breakfast, 1 Boost VHC, pinto beans (So far today)  Patient is excited that he is tolerating some "normal "foods. Reports beef was difficult to swallow but he got it down. Drinks a lot of water with meals to ease swallowing. Taste is better. Mouth sores come and go. He is using baking soda and salt water rinses. Mouth is still dry. Denies pain.   Estimated nutrition needs: 2250-2600 cal, 105-120 g protein, rater than 2.3 L fluid.  Nutrition diagnosis: Inadequate oral intake, improving.  Intervention: Continue small frequent meals and snacks with soft foods as tolerated.  Gradually increase variety.  Continue 3 cartons of boost VHC for now.  As oral intake increases, can decrease boost very high-calorie gradually if weight maintenance is achieved.  Monitoring, evaluation, goals: Patient will tolerate increased calories and protein to minimize weight loss and promote continued healing.  Next visit: Nutrition follow-up  scheduled for February 11 at 3:15 after MD appointment.

## 2023-09-30 ENCOUNTER — Encounter (HOSPITAL_COMMUNITY)
Admission: RE | Admit: 2023-09-30 | Discharge: 2023-09-30 | Disposition: A | Discharge status: Home / Self Care | Payer: BC Managed Care – PPO | Source: Ambulatory Visit | Attending: Radiation Oncology | Admitting: Radiation Oncology

## 2023-09-30 DIAGNOSIS — C760 Malignant neoplasm of head, face and neck: Secondary | ICD-10-CM | POA: Diagnosis not present

## 2023-09-30 DIAGNOSIS — C801 Malignant (primary) neoplasm, unspecified: Secondary | ICD-10-CM | POA: Diagnosis not present

## 2023-09-30 DIAGNOSIS — C779 Secondary and unspecified malignant neoplasm of lymph node, unspecified: Secondary | ICD-10-CM | POA: Diagnosis not present

## 2023-09-30 LAB — GLUCOSE, CAPILLARY
Glucose-Capillary: 63 mg/dL — ABNORMAL LOW (ref 70–99)
Glucose-Capillary: 71 mg/dL (ref 70–99)

## 2023-09-30 MED ORDER — FLUDEOXYGLUCOSE F - 18 (FDG) INJECTION
8.0000 | Freq: Once | INTRAVENOUS | Status: AC
  Administered 2023-09-30: 7.97 via INTRAVENOUS

## 2023-09-30 NOTE — Progress Notes (Signed)
Peter [name] presents to the clinic for a follow up to review PET scan results.  He completed radiation treatment for Secondary and unspecified malignant neoplasm of lymph nodes of head, face and neck.   Pain issues, if any: None Using a feeding tube?: None Weight changes, if any: None Swallowing issues, if any: Swallowing issues have gotten better, he is eating and drinking enough. However, some foods are more difficult to swallow. Overall he says he is progressing well. Smoking or chewing tobacco? None Using fluoride toothpaste daily? Yes Last ENT visit was on: August 24, 2023 Other notable issues, if any: None    BP 113/79 (BP Location: Left Arm, Patient Position: Sitting)   Pulse 84   Temp (!) 96.6 F (35.9 C) (Temporal)   Resp 18   Ht 5\' 8"  (1.727 m)   Wt 159 lb (72.1 kg)   SpO2 100%   BMI 24.18 kg/m    Wt Readings from Last 3 Encounters:  10/05/23 159 lb (72.1 kg)  07/14/23 160 lb 6 oz (72.7 kg)  06/08/23 164 lb 3.2 oz (74.5 kg)

## 2023-10-05 ENCOUNTER — Encounter: Payer: Self-pay | Admitting: Radiation Oncology

## 2023-10-05 ENCOUNTER — Ambulatory Visit
Admission: RE | Admit: 2023-10-05 | Discharge: 2023-10-05 | Disposition: A | Discharge status: Home / Self Care | Payer: BC Managed Care – PPO | Source: Ambulatory Visit | Attending: Radiation Oncology | Admitting: Radiation Oncology

## 2023-10-05 ENCOUNTER — Inpatient Hospital Stay: Payer: BC Managed Care – PPO | Attending: Oncology | Admitting: Nutrition

## 2023-10-05 VITALS — BP 113/79 | HR 84 | Temp 96.6°F | Resp 18 | Ht 68.0 in | Wt 159.0 lb

## 2023-10-05 DIAGNOSIS — C760 Malignant neoplasm of head, face and neck: Secondary | ICD-10-CM

## 2023-10-05 DIAGNOSIS — C801 Malignant (primary) neoplasm, unspecified: Secondary | ICD-10-CM | POA: Insufficient documentation

## 2023-10-05 DIAGNOSIS — Z79899 Other long term (current) drug therapy: Secondary | ICD-10-CM | POA: Diagnosis not present

## 2023-10-05 DIAGNOSIS — C770 Secondary and unspecified malignant neoplasm of lymph nodes of head, face and neck: Secondary | ICD-10-CM | POA: Diagnosis not present

## 2023-10-05 DIAGNOSIS — I700 Atherosclerosis of aorta: Secondary | ICD-10-CM | POA: Insufficient documentation

## 2023-10-05 DIAGNOSIS — I890 Lymphedema, not elsewhere classified: Secondary | ICD-10-CM | POA: Insufficient documentation

## 2023-10-05 NOTE — Progress Notes (Signed)
Nutrition follow-up completed with patient and wife after MD visit.  Patient completed radiation therapy for nasopharyngeal cancer on November 6.  He did not have a feeding tube.  Weight is stable at 159 pounds.  He weighed 160 pounds 6 ounces November 20.  Patient tolerating a regular soft diet.  He does not tolerate spicy foods.  He states bread does not taste good but he can eat cinnamon toast.  He is drinking soy milk several times a day and continues to drink 1 boost VHC, usually at lunch.  He continues to drink a lot of liquid with meals to help ease swallowing.  Reports sometimes this contributes to early satiety.  Nutrition diagnosis: Inadequate oral intake resolved.  Provided support and encouragement.  Encouraged him to continue weight maintenance and incorporating a variety of soft foods and oral nutrition supplements as needed.  Encouraged patient to reach out for any nutrition questions or needs.  **Disclaimer: This note was dictated with voice recognition software. Similar sounding words can inadvertently be transcribed and this note may contain transcription errors which may not have been corrected upon publication of note.**

## 2023-10-05 NOTE — Progress Notes (Signed)
Radiation Oncology         [phone] ________________________________  Name: Peter [name] MRN: [id number]  Date: 10/05/2023  DOB: [birth year]  Follow-Up Visit Note  CC: Pincus Sanes, MD  Christia Reading, MD  Diagnosis and Prior Radiotherapy:       ICD-10-CM   1. Head and neck cancer (HCC)  C76.0        Cancer Staging  Head and neck cancer (HCC) Staging form: Pharynx - HPV-Mediated Oropharynx, AJCC 8th Edition - Clinical stage from 04/07/2023: Stage I (cT0, cN1, cM0, p16+) - Unsigned Stage prefix: Initial diagnosis  First Treatment Date: 2023-05-13 - Last Treatment Date: 2023-06-30   Plan Name: HN_Orop Site: Oropharynx Technique: IMRT Mode: Photon Dose Per Fraction: 2 Gy Prescribed Dose (Delivered / Prescribed): 70 Gy / 70 Gy Prescribed Fxs (Delivered / Prescribed): 35 / 35     CHIEF COMPLAINT:  Here for follow-up and surveillance of neck cancer; s/p radiation  Narrative:  The patient returns today for routine follow-up and to review his most recent PET scan.   PET from 09/30/2023 demonstrated: interval resolution of the left level 2 lymph node, no new sites of nodal metastasis within the neck; new midline increased tracer uptake within the tongue and asymmetric increased tracer uptake within the right posterior hypopharynx, likely physiologic, but attention in these areas on future imaging was advised; no signs of metastatic disease within the chest, abdomen, or pelvis.   Pain issues, if any: None Using a feeding tube?: None Weight changes, if any: None Swallowing issues, if any: Swallowing issues have gotten better, he is eating and drinking enough. However, some foods are more difficult to eat. Smoking or chewing tobacco? None Using fluoride toothpaste daily? Yes Last ENT visit was on: August 24, 2023 Other notable issues, if any: None          ALLERGIES:  has no known allergies.  Meds: Current Outpatient Medications  Medication Sig Dispense Refill    famotidine (PEPCID) 20 MG tablet Take 20 mg by mouth as needed for heartburn or indigestion.     sucralfate (CARAFATE) 1 g tablet Dissolve 1 tablet in 10 mL H20 and swallow 30 min prior to meals and bedtime. Swish in mouth first, if needed for mouth sores. Use up to 4 times daily. 40 tablet 3   acetaminophen (TYLENOL) 500 MG tablet Take 1,000 mg by mouth every 6 (six) hours as needed.     fluconazole (DIFLUCAN) 100 MG tablet Take 2 tablets today, then 1 tablet daily x 6 more days. 8 tablet 0   HYDROcodone-acetaminophen (NORCO/VICODIN) 5-325 MG tablet Take 1 tablet by mouth 3 (three) times daily as needed for moderate pain (pain score 4-6). May constipate - use Miralax as needed for constipation. 40 tablet 0   ibuprofen (ADVIL) 400 MG tablet Take 400 mg by mouth every 6 (six) hours as needed.     lidocaine (XYLOCAINE) 2 % solution Patient: Mix 1part 2% viscous lidocaine, 1part H20. Swish & swallow 10mL of diluted mixture, before meals and at bedtime, up to QID 200 mL 3   ondansetron (ZOFRAN) 8 MG tablet Take 1 tablet (8 mg total) by mouth every 8 (eight) hours as needed for nausea or vomiting. 20 tablet 2   No current facility-administered medications for this encounter.    Physical Findings:  The patient is in no acute distress. Patient is alert and oriented. Wt Readings from Last 3 Encounters:  10/05/23 159 lb (72.1 kg)  07/14/23 160  lb 6 oz (72.7 kg)  06/08/23 164 lb 3.2 oz (74.5 kg)    height is 5\' 8"  (1.727 m) and weight is 159 lb (72.1 kg). His temporal temperature is 96.6 F (35.9 C) (abnormal). His blood pressure is 113/79 and his pulse is 84. His respiration is 18 and oxygen saturation is 100%. .  General: Alert and oriented, in no acute distress HEENT: Head is normocephalic. Extraocular movements are intact. Oropharynx is notable for resolving mucositis, with some staining from blueberries. no thrush; Neck: Neck is notable for no masses. Mild edema at submental region Skin:  Skin in treatment fields shows satisfactory healing over neck Extremities: No cyanosis or edema. Lymphatics: see Neck Exam. No palpable axillary adenopathy.  Psychiatric: Judgment and insight are intact. Affect is appropriate.  PROCEDURE NOTE: After obtaining consent and spraying nasal cavity with topical oxymetazoline and lidocaine, the flexible endoscope was coated with lidocaine gel and introduced and passed through the nasal cavity.  The nasopharynx, oropharynx, hypopharynx, and larynx were then examined. No lesions appreciated in the mucosal axis. The true cords were symmetrically mobile. The patient tolerated the procedure well.   Lab Findings: Lab Results  Component Value Date   WBC 7.0 03/25/2023   HGB 15.0 03/25/2023   HCT 46.0 03/25/2023   MCV 95.2 03/25/2023   PLT 221.0 03/25/2023    Lab Results  Component Value Date   TSH 2.87 03/25/2023    Radiographic Findings: NM PET Image Restag (PS) Skull Base To Thigh Result Date: 10/04/2023 CLINICAL DATA:  Subsequent treatment strategy for head and neck cancer. EXAM: NUCLEAR MEDICINE PET SKULL BASE TO THIGH TECHNIQUE: 7.97 mCi F-18 FDG was injected intravenously. Full-ring PET imaging was performed from the skull base to thigh after the radiotracer. CT data was obtained and used for attenuation correction and anatomic localization. Fasting blood glucose: 71 mg/dl COMPARISON:  16/05/9603 FINDINGS: Mediastinal blood pool activity: SUV max 2.79 Liver activity: SUV max NA NECK: Previous tracer avid left level 2 lymph node has resolved in the interval. No new tracer avid lymph nodes. Midline increased tracer uptake within the tongue is new from the prior exam with SUV max of 5.63, image 24 the fused PET-CT images. New asymmetric increased tracer uptake within the right posterior hypopharynx has an SUV max of 5.0, image 40 of the fused PET-CT images. The primary site of disease is remains uncertain. Incidental CT findings: None. CHEST: No  hypermetabolic mediastinal or hilar nodes. No suspicious pulmonary nodules on the CT scan. Incidental CT findings: None. ABDOMEN/PELVIS: There is no abnormal tracer uptake identified within the liver, pancreas, spleen or adrenal glands. No tracer avid abdominopelvic lymph nodes. Incidental CT findings: Aortic atherosclerotic calcifications. SKELETON: No focal hypermetabolic activity to suggest skeletal metastasis. Incidental CT findings: None. IMPRESSION: 1. Interval resolution previous tracer avid left level 2 lymph node. No new sites of nodal metastasis within the neck. 2. New midline increased tracer uptake within the tongue and asymmetric increased tracer uptake within the right posterior hypopharynx. The primary site of disease is remains uncertain. Likely physiologic. Attention in these areas on future surveillance imaging advised. 3. No signs of tracer avid metastatic disease within the chest, abdomen or pelvis. 4.  Aortic Atherosclerosis (ICD10-I70.0). Electronically Signed   By: Signa Kell M.D.   On: 10/04/2023 13:18    Impression/Plan:    1) Head and Neck Cancer Status: Patient is doing well overall. We reviewed his most recent PET scan which shows interval resolution to the left cervical node.  Scan also showed some uptake within the tongue and right posterior hypopharynx which could be typical changes from radiation. Scope today fortunately showed no evidence of disease recurrence. We will review his case at our tumor board next week and continue to monitor him closely.   2) Nutritional Status: Stable - he is scheduled to see nutrition later today.  Wt Readings from Last 3 Encounters:  10/05/23 159 lb (72.1 kg)  07/14/23 160 lb 6 oz (72.7 kg)  06/08/23 164 lb 3.2 oz (74.5 kg)  PEG tube: N/A  3) Risk Factors:  Patient is a never smoker.   4) Swallowing: continue SLP exercises. He is scheduled to see SLP on 10/11/23.   5) Dental: Encouraged to continue regular followup with dentistry,  and dental hygiene including fluoride treatments as tolerated. He states he is following closely with his dentist.   6) Thyroid function: Checking annually, will get at next visit.  Lab Results  Component Value Date   TSH 2.87 03/25/2023   7) Mild lymphedema: continue PT exercises and lymphedema compression wrap  7) Patient will follow-up with Dr. Jenne Pane in 3 months (he knows to call his office if they don't reach out after our referral back) and radiation in 6 months. Anticipate restaging scan 12 months from now, we will discuss follow-up imaging at his next appointment with [place].   On date of service, in total, we spent 30 minutes on this encounter. Patient was seen in person. _____________________________________    Bryan Lemma, PA-C   Lonie Peak, MD    [facility]  Radiation Oncology Direct Dial: [phone]  Fax: [phone] [email or web address]

## 2023-10-05 NOTE — Progress Notes (Signed)
Oncology Nurse Navigator Documentation   Per patient's 10/05/23 post-treatment follow-up with Dr. Basilio Cairo, sent fax to [facility] ENT Scheduling with request Mr. Dubin be contacted and scheduled for routine post-RT follow-up with Dr. Jenne Pane in 3 months.  Notification of successful fax transmission received.   Hedda Slade RN, BSN, OCN Head & Neck Oncology Nurse Navigator [facility] Phone # [phone]  Fax # [phone]

## 2023-10-11 ENCOUNTER — Ambulatory Visit: Payer: BC Managed Care – PPO | Attending: Radiation Oncology

## 2023-10-11 DIAGNOSIS — R1310 Dysphagia, unspecified: Secondary | ICD-10-CM | POA: Insufficient documentation

## 2023-10-11 NOTE — Therapy (Signed)
OUTPATIENT SPEECH LANGUAGE PATHOLOGY ONCOLOGY TREATMENT/RECERTIFICATION   Patient Name: Peter [name] MRN: [id number] DOB:[birth year], 58 y.o., male Today's Date: 10/11/2023  PCP: Cheryll Cockayne, MD REFERRING PROVIDER: Lonie Peak, MD  END OF SESSION:  End of Session - 10/11/23 1442     Visit Number 4    Number of Visits 7    Date for SLP Re-Evaluation 01/09/24    SLP Start Time 1404    SLP Stop Time  1431    SLP Time Calculation (min) 27 min    Activity Tolerance Patient tolerated treatment well              Past Medical History:  Diagnosis Date   COVID 04/04/2021   states mild sx   Degenerative disc disease, lumbar    Dr. Shelle Iron   Dyslipidemia    GERD (gastroesophageal reflux disease)    Helicobacter pylori antibody positive 02/21/2005   treated with antibiotics   Past Surgical History:  Procedure Laterality Date   COLONOSCOPY  2013   normal   ESOPHAGOGASTRODUODENOSCOPY  2007   Dr. 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 Hx: He presented to Dr. Clovis Cao at [facility] on 01/28/23 with a left sided neck lump X 1 week. Physical exam revealed a 1 cm left level II somewhat mobile mass. No other palpable masses or enlarged were noted in the neck. 02/03/23 CT neck demonstrated a 3.1 cystic/necrotic left level IIA lymph node suspicious for metastatic disease. No definite primary was identified in the neck. 02/18/23 FNA collected by Dr. Jenne Pane showed findings consistent with atypical dyskeratotic squamous cells suspicious for malignancy; p16 negative. Given the patient's age and presence of dyskeratotic cells, the features were noted as suspicious for squamous cell carcinoma. Of note: clear yellow fluid was aspirated from the left neck mass during FNA. 03/11/23 core biopsy of left cervical node showed findings consistent with metastatic SCC, p 16 +. 03/18/23 PET demonstrated hypermetabolism associated with the left level IIA cervical lymph node, consistent with metastatic squamous cell carcinoma. PET otherwise showed no primary head and neck malignancy identified, and no evidence of metastatic disease in the chest, abdomen, or pelvis. Symmetric hypermetabolic activity was appreciated in both tonsillar regions, however this is likely physiologic or inflammatory in etiology. 03/26/23 Consult with Dr. Lou Cal, Laryngoscopy unremarkable. She  recommended another biopsy under anesthesia. 04/05/23 Biopsies of throat (tonsil, base of tongue, epiglottis) all negative.  04/06/23 Consult with Dr. Basilio Cairo, 04/15/23 Consult with Dr. Truett Perna. He will receive radiation only. Treatment plan:  He will receive 35 fractions of radiation to his Oropharynx and bilateral neck.Treatment  started 9/19 and will complete 11/6  PAIN:  Are you having pain? Yes: NPRS scale: 4/10 Pain location: throat Pain description: sore Aggravating factors: only when swallowing Relieving factors: meds  FALLS: Has patient fallen in last 6 months?  No  PATIENT GOALS: Maintain WNL swallowing  OBJECTIVE:  Note: Objective measures were completed at Evaluation unless otherwise noted.   TODAY'S TREATMENT:                                                                                                                                         DATE:   10/11/23: Pt describes more difficulty with oatmeal due to grit. Pt has not tried salads yet. SLP provided a suggestion for salads. With POs today pt ate fig bar and drank water without overt s/sx oral or pharyngeal difficulty.  With HEP, pt has completed as directed by SLP. SLP told pt to incr reps or incr hold time in order to make exercise more difficult. Pt completed HEP with independence today. SLP and pt agreed he could be seen every 8 weeks due to progress.   08/11/23: Pt tried sweet potato and this traveled well through pharynx. Fried egg did not have as much success. SLP educated on soft, mushy, slick foods are those which are going to be best currently. Re-educated about food journal as pt had been keeping one of these prior to going to liquids only. SLP provided pt with overt s/sx aspiration PNA and demonstrated understanding by telling SLP s/sx. Today pt had 1/2 teaspoons of applesauce x2 with stinging/burning after two boluses; No overt s/sx oral or pharyngeal deficits with this or sips of water x8. SLP suggested some dys I-II items for pt to try.  With HEP pt began doing this again approx 2 weeks ago, up to then was at suboptimal frequency and scope. Today he was independent with procedure and SLP told him to cont with prescribed scope and frequency. He agreed.    07/08/23: Pt performed two reps of each exercise on HEP with initial cue to  protrude tongue further. Independent by session end.  Pt is swallowing POs of Boost VHC, and water. Does not reports notable episodes of coughing at this time. He drank water today without any signs oral difficulty, nor any s/sx (overt) of pharyngeal deficits. Pt told SLP rationale for HEP and for food journal.   05/27/23 (eval): Research states the risk for dysphagia increases due to radiation and/or chemotherapy treatment due to a variety of factors, so SLP educated the pt about the possibility of reduced/limited ability for PO intake during rad tx. SLP also educated  pt regarding possible changes to swallowing musculature after rad tx, and why adherence to dysphagia HEP provided today and PO consumption was necessary to inhibit muscle fibrosis following rad tx and to mitigate muscle disuse atrophy. SLP informed pt why this would be detrimental to their swallowing status and to their pulmonary health. Pt demonstrated understanding of these things to SLP. SLP encouraged pt to safely eat and drink as deep into their radiation/chemotherapy as possible to provide the best possible long-term swallowing outcome for pt.  SLP then developed an individualized HEP for pt involving oral and pharyngeal strengthening and ROM and pt was instructed how to perform these exercises, including SLP demonstration. After SLP demonstration, pt return demonstrated each exercise. SLP ensured pt performance was correct prior to educating pt on next exercise. Pt required min cues faded to modified independent to perform HEP. Pt was instructed to complete this program 6-7 days/week, at least 2 times a day until 6 months after his or her last day of rad tx, and then x2 a week after that, indefinitely. Among other modifications for days when pt cannot functionally swallow, SLP also suggested pt to perform only non-swallowing tasks on the handout/HEP, and if necessary to cycle through the swallowing portion so the full program of exercises  can be completed instead of fatiguing on one of the swallowing exercises and being unable to perform the other swallowing exercises. SLP instructed that swallowing exercises should then be added back into the regimen as pt is able to do so. Secondly, pt was told that former patients have told SLP that during their course of radiation therapy, taking prescribed pain medication just prior to performing HEP (and eating/drinking) has proven helpful in completing HEP (and eating and drinking) more regularly when going through their course of radiation treatment.    PATIENT EDUCATION: Education details:  when to decr frequency of HEP Person educated: Patient Education method: Explanation Education comprehension: verbalized understanding   ASSESSMENT:  CLINICAL IMPRESSION: RECERT TODAY. Pt agreed with SLP he could be seen every 8 weeks due to progress. Patient is a 58 y.o. M who was seen today for treatment of swallowing after radiation/chemoradiation therapy. Today pt ate items from Dys III and drank thin liquids. No oral or overt s/sx pharyngeal deficits, including aspiration were observed. At this time pt swallowing is deemed WNL/WFL with these POs. There are no overt s/s aspiration PNA observed by SLP nor any reported by pt at this time. Data indicate that pt's swallow ability will likely decrease over the course of radiation/chemoradiation therapy and could very well decline over time following the conclusion of that therapy due to muscle disuse atrophy and/or muscle fibrosis. Pt will cont to need to be seen by SLP in order to assess safety of PO intake, assess the need for recommending any objective swallow assessment, and ensuring pt is correctly completing the individualized HEP.  OBJECTIVE IMPAIRMENTS: include voice disorder and dysphagia. These impairments are limiting patient from effectively communicating at home and in community and safety when swallowing. Factors affecting potential to achieve  goals and functional outcome are  none noted . Patient will benefit from skilled SLP services to address above impairments and improve overall function.   REHAB POTENTIAL: Good   GOALS: Goals reviewed with patient? No   SHORT TERM GOALS: Target: 3rd total session   1. Pt will complete HEP with modified independence in 2 sessions Baseline:07/08/23 Goal status: met   2.  pt will tell SLP why pt is completing  HEP with modified independence Baseline:  Goal status: met   3.  pt will describe 3 overt s/s aspiration PNA with modified independence Baseline:  Goal status: met   4.  pt will tell SLP how a food journal could hasten return to a more normalized diet Baseline:  Goal status: met     LONG TERM GOALS: Target: 7th total session   1.  pt will complete HEP with independence over two visits Baseline: 10/11/23 Goal status: INITIAL   2.  pt will describe how to modify HEP over time, and the timeline associated with reduction in HEP frequency with modified independence over two sessions Baseline:  Goal status: INITIAL   PLAN:   SLP FREQUENCY:  once approx every 8 weeks   SLP DURATION:  7 sessions   PLANNED INTERVENTIONS: Aspiration precaution training, Pharyngeal strengthening exercises, Diet toleration management , Trials of upgraded texture/liquids, SLP instruction and feedback, Compensatory strategies, and Patient/family education     [facility], CCC-SLP 10/11/2023, 2:44 PM

## 2023-11-29 DIAGNOSIS — N400 Enlarged prostate without lower urinary tract symptoms: Secondary | ICD-10-CM | POA: Diagnosis not present

## 2023-11-29 DIAGNOSIS — Z8042 Family history of malignant neoplasm of prostate: Secondary | ICD-10-CM | POA: Diagnosis not present

## 2023-12-06 ENCOUNTER — Ambulatory Visit: Payer: BC Managed Care – PPO | Attending: Radiation Oncology

## 2023-12-06 DIAGNOSIS — R1310 Dysphagia, unspecified: Secondary | ICD-10-CM | POA: Insufficient documentation

## 2023-12-06 NOTE — Therapy (Signed)
 OUTPATIENT SPEECH LANGUAGE PATHOLOGY ONCOLOGY TREATMENT/DISCHARGE   Patient Name: Peter [name] MRN: [id number] DOB:[birth year], 58 y.o., male Today's Date: 12/06/2023  PCP: Oma Bias, MD REFERRING PROVIDER: Colie Dawes, MD  END OF SESSION:  End of Session - 12/06/23 1434     Visit Number 5    Number of Visits 7    Date for SLP Re-Evaluation 01/09/24    SLP Start Time 1403    SLP Stop Time  1426    SLP Time Calculation (min) 23 min    Activity Tolerance Patient tolerated treatment well               Past Medical History:  Diagnosis Date   COVID 04/04/2021   states mild sx   Degenerative disc disease, lumbar    Dr. Leighton Punches   Dyslipidemia    GERD (gastroesophageal reflux disease)    Helicobacter pylori antibody positive 02/21/2005   treated with antibiotics   Past Surgical History:  Procedure Laterality Date   COLONOSCOPY  2013   normal   ESOPHAGOGASTRODUODENOSCOPY  2007   Dr. 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 All LTGs were met. Pt's swallowing is WNL at this time.   Remaining deficits: None, except with (reported) steak - needed to reduce bite size further and pt did well.   Education / Equipment: See therapy notes   Patient agrees to discharge. Patient goals were met. Patient is being discharged due to meeting the stated rehab goals..  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 Hx: He presented to Dr. Vickki Grandchild at [facility] on 01/28/23 with a left sided neck lump X 1 week. Physical exam revealed a 1 cm left level II somewhat mobile mass. No other palpable masses or enlarged were noted in the neck. 02/03/23 CT neck demonstrated a 3.1 cystic/necrotic left level IIA lymph node suspicious for metastatic disease. No definite primary was identified in the neck. 02/18/23 FNA collected by Dr. Tellis Feathers showed findings consistent with atypical dyskeratotic squamous cells suspicious for malignancy; p16 negative. Given the patient's age and presence of dyskeratotic cells, the features were noted as suspicious for squamous cell carcinoma. Of note: clear yellow fluid was aspirated from the left neck mass during FNA. 03/11/23 core biopsy of left cervical node showed findings consistent with metastatic SCC, p 16 +. 03/18/23 PET demonstrated hypermetabolism associated with the left level IIA cervical lymph node, consistent with metastatic squamous cell carcinoma. PET otherwise showed no primary head and neck malignancy identified, and no evidence of metastatic disease in the  chest, abdomen, or pelvis. Symmetric hypermetabolic activity was appreciated in both tonsillar regions, however this is likely physiologic or inflammatory in etiology. 03/26/23 Consult with Dr. Jacque Math, Laryngoscopy unremarkable. She recommended another biopsy under anesthesia. 04/05/23 Biopsies of throat (tonsil, base of tongue, epiglottis) all negative.  04/06/23 Consult with Dr. Lurena Sally, 04/15/23 Consult  with Dr. Scherrie Curt. He will receive radiation only. Treatment plan:  He will receive 35 fractions of radiation to his Oropharynx and bilateral neck.Treatment started 9/19 and will complete 11/6  PAIN:  Are you having pain? No  FALLS: Has patient fallen in last 6 months?  No  PATIENT GOALS: Maintain WNL swallowing  OBJECTIVE:  Note: Objective measures were completed at Evaluation unless otherwise noted.   TREATMENT:                                                                                                                                         DATE:   12/06/23:  "I am still the slowest one eating," pt stated. "Pork chops, chicken, steak, greens, sweet potatoes" eaten in the last 2 weeks. Pt ate peanut butter crackers and drank water today without overt s/sx oral or pharyngeal deficits.  He was independent today with HEP. He told SLP when he needed to decr frequency of HEP and told SLP correctly. Pt agreed with d/c today. SLP educated pt about s/sx swallowing difficulty that would warrant contacting pt's ENT.   10/11/23: Pt describes more difficulty with oatmeal due to grit. Pt has not tried salads yet. SLP provided a suggestion for salads. With POs today pt ate fig bar and drank water without overt s/sx oral or pharyngeal difficulty.  With HEP, pt has completed as directed by SLP. SLP told pt to incr reps or incr hold time in order to make exercise more difficult. Pt completed HEP with independence today. SLP and pt agreed he could be seen every 8 weeks due to progress.   08/11/23: Pt tried sweet potato and this traveled well through pharynx. Fried egg did not have as much success. SLP educated on soft, mushy, slick foods are those which are going to be best currently. Re-educated about food journal as pt had been keeping one of these prior to going to liquids only. SLP provided pt with overt s/sx aspiration PNA and demonstrated understanding by telling SLP s/sx. Today pt had 1/2 teaspoons of  applesauce x2 with stinging/burning after two boluses; No overt s/sx oral or pharyngeal deficits with this or sips of water x8. SLP suggested some dys I-II items for pt to try.  With HEP pt began doing this again approx 2 weeks ago, up to then was at suboptimal frequency and scope. Today he was independent with procedure and SLP told him to cont with prescribed scope and frequency. He agreed.    07/08/23: Pt performed two reps of each exercise on HEP with initial cue to protrude tongue  further. Independent by session end.  Pt is swallowing POs of Boost VHC, and water. Does not reports notable episodes of coughing at this time. He drank water today without any signs oral difficulty, nor any s/sx (overt) of pharyngeal deficits. Pt told SLP rationale for HEP and for food journal.   05/27/23 (eval): Research states the risk for dysphagia increases due to radiation and/or chemotherapy treatment due to a variety of factors, so SLP educated the pt about the possibility of reduced/limited ability for PO intake during rad tx. SLP also educated pt regarding possible changes to swallowing musculature after rad tx, and why adherence to dysphagia HEP provided today and PO consumption was necessary to inhibit muscle fibrosis following rad tx and to mitigate muscle disuse atrophy. SLP informed pt why this would be detrimental to their swallowing status and to their pulmonary health. Pt demonstrated understanding of these things to SLP. SLP encouraged pt to safely eat and drink as deep into their radiation/chemotherapy as possible to provide the best possible long-term swallowing outcome for pt.  SLP then developed an individualized HEP for pt involving oral and pharyngeal strengthening and ROM and pt was instructed how to perform these exercises, including SLP demonstration. After SLP demonstration, pt return demonstrated each exercise. SLP ensured pt performance was correct prior to educating pt on next exercise. Pt  required min cues faded to modified independent to perform HEP. Pt was instructed to complete this program 6-7 days/week, at least 2 times a day until 6 months after his or her last day of rad tx, and then x2 a week after that, indefinitely. Among other modifications for days when pt cannot functionally swallow, SLP also suggested pt to perform only non-swallowing tasks on the handout/HEP, and if necessary to cycle through the swallowing portion so the full program of exercises can be completed instead of fatiguing on one of the swallowing exercises and being unable to perform the other swallowing exercises. SLP instructed that swallowing exercises should then be added back into the regimen as pt is able to do so. Secondly, pt was told that former patients have told SLP that during their course of radiation therapy, taking prescribed pain medication just prior to performing HEP (and eating/drinking) has proven helpful in completing HEP (and eating and drinking) more regularly when going through their course of radiation treatment.    PATIENT EDUCATION: Education details:  see "treatment date" for today's date Person educated: Patient Education method: Explanation Education comprehension: verbalized understanding   ASSESSMENT:  CLINICAL IMPRESSION: Gaither Juba is a 58 y.o. M who was seen today for treatment of swallowing after radiation/chemoradiation therapy. Today pt ate items from regular diet and drank thin liquids. No oral or overt s/sx pharyngeal deficits, including aspiration were observed. At this time pt swallowing is deemed WNL/WFL with these POs. There are no overt s/s aspiration PNA observed by SLP nor any reported by pt at this time. Data indicate that pt's swallow ability will likely decrease over the course of radiation/chemoradiation therapy and could very well decline over time following the conclusion of that therapy due to muscle disuse atrophy and/or muscle fibrosis. Pt agreed to d/c  today.  OBJECTIVE IMPAIRMENTS: include voice disorder and dysphagia. These impairments are limiting patient from effectively communicating at home and in community and safety when swallowing. Factors affecting potential to achieve goals and functional outcome are  none noted . Patient will benefit from skilled SLP services to address above impairments and improve overall function.   REHAB POTENTIAL:  Good   GOALS: Goals reviewed with patient? No   SHORT TERM GOALS: Target: 3rd total session   1. Pt will complete HEP with modified independence in 2 sessions Baseline:07/08/23 Goal status: met   2.  pt will tell SLP why pt is completing HEP with modified independence Baseline:  Goal status: met   3.  pt will describe 3 overt s/s aspiration PNA with modified independence Baseline:  Goal status: met   4.  pt will tell SLP how a food journal could hasten return to a more normalized diet Baseline:  Goal status: met     LONG TERM GOALS: Target: 7th total session   1.  pt will complete HEP with independence over two visits Baseline: 10/11/23 Goal status: Met   2.  pt will describe how to modify HEP over time, and the timeline associated with reduction in HEP frequency with modified independence over two sessions Baseline:  Goal status: Met   PLAN:  Discharge today   PLANNED INTERVENTIONS: Aspiration precaution training, Pharyngeal strengthening exercises, Diet toleration management , Trials of upgraded texture/liquids, SLP instruction and feedback, Compensatory strategies, and Patient/family education     [facility], CCC-SLP 12/06/2023, 2:34 PM

## 2023-12-30 DIAGNOSIS — Z8589 Personal history of malignant neoplasm of other organs and systems: Secondary | ICD-10-CM | POA: Diagnosis not present

## 2024-01-13 ENCOUNTER — Ambulatory Visit: Payer: Self-pay | Admitting: Internal Medicine

## 2024-01-13 ENCOUNTER — Ambulatory Visit (INDEPENDENT_AMBULATORY_CARE_PROVIDER_SITE_OTHER)

## 2024-01-13 ENCOUNTER — Ambulatory Visit (INDEPENDENT_AMBULATORY_CARE_PROVIDER_SITE_OTHER): Admitting: Internal Medicine

## 2024-01-13 ENCOUNTER — Ambulatory Visit: Payer: Self-pay

## 2024-01-13 VITALS — BP 120/78 | HR 72 | Temp 98.3°F | Ht 68.0 in | Wt 160.0 lb

## 2024-01-13 DIAGNOSIS — R079 Chest pain, unspecified: Secondary | ICD-10-CM | POA: Insufficient documentation

## 2024-01-13 DIAGNOSIS — E785 Hyperlipidemia, unspecified: Secondary | ICD-10-CM

## 2024-01-13 DIAGNOSIS — R071 Chest pain on breathing: Secondary | ICD-10-CM

## 2024-01-13 DIAGNOSIS — R739 Hyperglycemia, unspecified: Secondary | ICD-10-CM | POA: Diagnosis not present

## 2024-01-13 DIAGNOSIS — R0781 Pleurodynia: Secondary | ICD-10-CM | POA: Diagnosis not present

## 2024-01-13 LAB — CBC WITH DIFFERENTIAL/PLATELET
Basophils Absolute: 0 10*3/uL (ref 0.0–0.1)
Basophils Relative: 0.1 % (ref 0.0–3.0)
Eosinophils Absolute: 0.1 10*3/uL (ref 0.0–0.7)
Eosinophils Relative: 0.8 % (ref 0.0–5.0)
HCT: 45.2 % (ref 39.0–52.0)
Hemoglobin: 15 g/dL (ref 13.0–17.0)
Lymphocytes Relative: 7.8 % — ABNORMAL LOW (ref 12.0–46.0)
Lymphs Abs: 0.8 10*3/uL (ref 0.7–4.0)
MCHC: 33.2 g/dL (ref 30.0–36.0)
MCV: 94.5 fl (ref 78.0–100.0)
Monocytes Absolute: 0.8 10*3/uL (ref 0.1–1.0)
Monocytes Relative: 8.2 % (ref 3.0–12.0)
Neutro Abs: 8.3 10*3/uL — ABNORMAL HIGH (ref 1.4–7.7)
Neutrophils Relative %: 83.1 % — ABNORMAL HIGH (ref 43.0–77.0)
Platelets: 188 10*3/uL (ref 150.0–400.0)
RBC: 4.78 Mil/uL (ref 4.22–5.81)
RDW: 13.3 % (ref 11.5–15.5)
WBC: 10 10*3/uL (ref 4.0–10.5)

## 2024-01-13 LAB — HEPATIC FUNCTION PANEL
ALT: 18 U/L (ref 0–53)
AST: 20 U/L (ref 0–37)
Albumin: 4.5 g/dL (ref 3.5–5.2)
Alkaline Phosphatase: 74 U/L (ref 39–117)
Bilirubin, Direct: 0 mg/dL (ref 0.0–0.3)
Total Bilirubin: 0.2 mg/dL (ref 0.2–1.2)
Total Protein: 7.6 g/dL (ref 6.0–8.3)

## 2024-01-13 LAB — BASIC METABOLIC PANEL WITH GFR
BUN: 17 mg/dL (ref 6–23)
CO2: 33 meq/L — ABNORMAL HIGH (ref 19–32)
Calcium: 9.3 mg/dL (ref 8.4–10.5)
Chloride: 101 meq/L (ref 96–112)
Creatinine, Ser: 0.86 mg/dL (ref 0.40–1.50)
GFR: 95.87 mL/min (ref >60.00)
Glucose, Bld: 97 mg/dL (ref 70–99)
Potassium: 4 meq/L (ref 3.5–5.1)
Sodium: 138 meq/L (ref 135–145)

## 2024-01-13 NOTE — Progress Notes (Signed)
 The test results show that your current treatment is OK, as the tests are stable.  Please continue the same plan.  There is no other need for change of treatment or further evaluation based on these results, at this time.  thanks

## 2024-01-13 NOTE — Telephone Encounter (Signed)
 Copied from CRM [id number]. Topic: Clinical - Red Word Triage >> Jan 13, 2024 11:58 AM Chuck Crater wrote: Red Word that prompted transfer to Nurse Triage: Patient thinks he has pneumonia. Symptoms: back and sternum hurts when breathing in halfway. He has difficulty breathing.  Chief Complaint: sternum pain when breathing going to back Symptoms: see above Frequency: started this am Pertinent Negatives: Patient denies fever, cough, runny nose, arm pain, neck pain Disposition: [] ED /[] Urgent Care (no appt availability in office) / [x] Appointment(In office/virtual)/ []  [facility]/ [] Home Care/ [] Refused Recommended Disposition /[]  Mobile Bus/ []  Follow-up with PCP Additional Notes: apt made per protocol; care advice given, denies questions; instructed to go to ER if becomes worse.   Reason for Disposition  [1] MILD difficulty breathing (e.g., minimal/no SOB at rest, SOB with walking, pulse <100) AND [2] NEW-onset or WORSE than normal  Answer Assessment - Initial Assessment Questions 1. RESPIRATORY STATUS: "Describe your breathing?" (e.g., wheezing, shortness of breath, unable to speak, severe coughing)      No coughing, no sob, states when he breathes in he has difficulty breathing 2. ONSET: "When did this breathing problem begin?"      This am 3. PATTERN "Does the difficult breathing come and go, or has it been constant since it started?"      na 4. SEVERITY: "How bad is your breathing?" (e.g., mild, moderate, severe)    - MILD: No SOB at rest, mild SOB with walking, speaks normally in sentences, can lie down, no retractions, pulse < 100.    - MODERATE: SOB at rest, SOB with minimal exertion and prefers to sit, cannot lie down flat, speaks in phrases, mild retractions, audible wheezing, pulse 100-120.    - SEVERE: Very SOB at rest, speaks in single words, struggling to breathe, sitting hunched forward, retractions, pulse > 120      na 5. RECURRENT SYMPTOM: "Have you had  difficulty breathing before?" If Yes, ask: "When was the last time?" and "What happened that time?"      no 6. CARDIAC HISTORY: "Do you have any history of heart disease?" (e.g., heart attack, angina, bypass surgery, angioplasty)      no 7. LUNG HISTORY: "Do you have any history of lung disease?"  (e.g., pulmonary embolus, asthma, emphysema)     no 8. CAUSE: "What do you think is causing the breathing problem?"      unknown 9. OTHER SYMPTOMS: "Do you have any other symptoms? (e.g., dizziness, runny nose, cough, chest pain, fever)     no 10. O2 SATURATION MONITOR:  "Do you use an oxygen saturation monitor (pulse oximeter) at home?" If Yes, ask: "What is your reading (oxygen level) today?" "What is your usual oxygen saturation reading?" (e.g., 95%)       na 11. PREGNANCY: "Is there any chance you are pregnant?" "When was your last menstrual period?"       na 12. TRAVEL: "Have you traveled out of the country in the last month?" (e.g., travel history, exposures)       no  Protocols used: Breathing Difficulty-A-AH

## 2024-01-13 NOTE — Progress Notes (Signed)
 Patient ID: Peter [name], male   DOB: [birth year], 58 y.o.   MRN: [id number]        Chief Complaint: follow up SSCP and right subscapular pain       HPI:  Peter [name] is a 57 y.o. male here with c/o new onset today sharp pleuritic pain to the lower Substernal area as well as the right subscapular area but no trauma, new activity, fever, chills, cough, sob, diaphoresis, n/v, palps or syncope.  No worsening leg swelling or hx of dvt pe.   Pt denies polydipsia, polyuria, or new focal neuro s/s.    Pt denies fever, wt loss, night sweats, loss of appetite, or other constitutional symptoms         Wt Readings from Last 3 Encounters:  01/13/24 160 lb (72.6 kg)  10/05/23 159 lb (72.1 kg)  07/14/23 160 lb 6 oz (72.7 kg)   BP Readings from Last 3 Encounters:  01/13/24 120/78  10/05/23 113/79  07/14/23 113/71         Past Medical History:  Diagnosis Date   COVID 04/04/2021   states mild sx   Degenerative disc disease, lumbar    Dr. Leighton Punches   Dyslipidemia    GERD (gastroesophageal reflux disease)    Helicobacter pylori antibody positive 02/21/2005   treated with antibiotics   Past Surgical History:  Procedure Laterality Date   COLONOSCOPY  2013   normal   ESOPHAGOGASTRODUODENOSCOPY  2007   Dr. Elsie Halo   NASAL SINUS SURGERY  1986    reports that he has never smoked. He has never used smokeless tobacco. He reports that he does not currently use alcohol. He reports that he does not use drugs. family history includes Colon polyps in his brother, brother, and mother; Diabetes in his brother and father; Heart attack (age of onset: 43) in his brother; Heart disease in his brother; Heart disease (age of onset: 48) in his brother; Heart disease (age of onset: 87) in his father; Prostate cancer in his brother. No Known Allergies Current Outpatient Medications on File Prior to Visit  Medication Sig Dispense Refill   famotidine (PEPCID) 20 MG tablet Take 20 mg by mouth as needed for heartburn or  indigestion.     fluticasone (FLONASE) 50 MCG/ACT nasal spray SMARTSIG:2 Puff(s) Both Nares Daily     sodium fluoride  (FLUORISHIELD) 1.1 % GEL dental gel SMARTSIG:sparingly By Mouth     sucralfate  (CARAFATE ) 1 g tablet Dissolve 1 tablet in 10 mL H20 and swallow 30 min prior to meals and bedtime. Swish in mouth first, if needed for mouth sores. Use up to 4 times daily. 40 tablet 3   No current facility-administered medications on file prior to visit.        ROS:  All others reviewed and negative.  Objective        PE:  BP 120/78 (BP Location: Right Arm, Patient Position: Sitting, Cuff Size: Normal)   Pulse 72   Temp 98.3 F (36.8 C) (Oral)   Ht 5\' 8"  (1.727 m)   Wt 160 lb (72.6 kg)   SpO2 98%   BMI 24.33 kg/m                 Constitutional: Pt appears in NAD               HENT: Head: NCAT.                Right Ear: External ear normal.  Left Ear: External ear normal.                Eyes: . Pupils are equal, round, and reactive to light. Conjunctivae and EOM are normal               Nose: without d/c or deformity               Neck: Neck supple. Gross normal ROM               Cardiovascular: Normal rate and regular rhythm.                 Pulmonary/Chest: Effort normal and breath sounds without rales or wheezing.                Abd:  Soft, NT, ND, + BS, no organomegaly               Neurological: Pt is alert. At baseline orientation, motor grossly intact               Skin: Skin is warm. No rashes, no other new lesions, LE edema - none               Psychiatric: Pt behavior is normal without agitation   Micro: none  Cardiac tracings I have personally interpreted today:  ECG  - NSR 73  Pertinent Radiological findings (summarize): none   Lab Results  Component Value Date   WBC 10.0 01/13/2024   HGB 15.0 01/13/2024   HCT 45.2 01/13/2024   PLT 188.0 01/13/2024   GLUCOSE 97 01/13/2024   CHOL 139 03/25/2023   TRIG 156.0 (H) 03/25/2023   HDL 38.30 (L)  03/25/2023   LDLCALC 69 03/25/2023   ALT 18 01/13/2024   AST 20 01/13/2024   NA 138 01/13/2024   K 4.0 01/13/2024   CL 101 01/13/2024   CREATININE 0.86 01/13/2024   BUN 17 01/13/2024   CO2 33 (H) 01/13/2024   TSH 2.87 03/25/2023   HGBA1C 6.0 03/25/2023   Assessment/Plan:  Peter [name] is a 58 y.o. White or Caucasian [1] male with  has a past medical history of COVID (04/04/2021), Degenerative disc disease, lumbar, Dyslipidemia, GERD (gastroesophageal reflux disease), and Helicobacter pylori antibody positive (02/21/2005).  Chest pain New onset today mild persistent, atypical, no shingles rash or recent trauma of lifiting, low likelihood cardiac,  seems different from his prior esophagitis improved with carafate , ECG reviewed, has hx of ENT cancer - - also for cxr today, as well d dimer and labs; overall suspicion if for possible msk pain  Hyperglycemia Lab Results  Component Value Date   HGBA1C 6.0 03/25/2023   Stable, pt to continue current medical treatment - diet, wt control   Dyslipidemia Lab Results  Component Value Date   LDLCALC 69 03/25/2023   Stable, pt to continue low chol diet  Followup: Return if symptoms worsen or fail to improve.  Peter Colonel, MD 01/14/2024 8:29 PM [facility]  Primary Care - [facility]

## 2024-01-13 NOTE — Assessment & Plan Note (Addendum)
 New onset today mild persistent, atypical, no shingles rash or recent trauma of lifiting, low likelihood cardiac,  seems different from his prior esophagitis improved with carafate , ECG reviewed, has hx of ENT cancer - - also for cxr today, as well d dimer and labs; overall suspicion if for possible msk pain

## 2024-01-13 NOTE — Patient Instructions (Signed)
 Your EKG was done today  Please continue all other medications as before, and refills have been done if requested.  Please have the pharmacy call with any other refills you may need.  Please keep your appointments with your specialists as you may have planned  Please go to the XRAY Department in the first floor for the x-ray testing  Please go to the LAB at the blood drawing area for the tests to be done  You will be contacted by phone if any changes need to be made immediately.  Otherwise, you will receive a letter about your results with an explanation, but please check with MyChart first.

## 2024-01-14 ENCOUNTER — Encounter: Payer: Self-pay | Admitting: Internal Medicine

## 2024-01-14 LAB — D-DIMER, QUANTITATIVE: D-Dimer, Quant: 0.19 ug{FEU}/mL (ref <0.50)

## 2024-01-14 NOTE — Assessment & Plan Note (Signed)
 Lab Results  Component Value Date   LDLCALC 69 03/25/2023   Stable, pt to continue low chol diet

## 2024-01-14 NOTE — Assessment & Plan Note (Signed)
 Lab Results  Component Value Date   HGBA1C 6.0 03/25/2023   Stable, pt to continue current medical treatment - diet, wt control

## 2024-01-23 NOTE — Progress Notes (Unsigned)
 Virtual Visit via Video Note  I connected with Peter [name] on 01/23/24 at 10:20 AM EDT by a video enabled telemedicine application and verified that I am speaking with the correct person using two identifiers.   I discussed the limitations of evaluation and management by telemedicine and the availability of in person appointments. The patient expressed understanding and agreed to proceed.  Present for the visit:  Myself, Dr Oma Bias, Delfina Feller.  The patient is currently at home and I am in the office.    No referring provider.    History of Present Illness: This is an acute visit to review recent blood work.  Saw Dr Autry Legions 5/22 for substernal pain, right subscapular pain.  The pain occurred with breathing in.  The pain did resolve after couple of days.  She thinks the pain may have been from playing with his dog-possibly strained the muscle.   He was concerned about the blood work that he had and wanted to review that.   Social History   Socioeconomic History   Marital status: Married    Spouse name: Not on file   Number of children: 2   Years of education: Not on file   Highest education level: Associate degree: occupational, Scientist, product/process development, or vocational program  Occupational History   Occupation: Catering manager: [employer]  Tobacco Use   Smoking status: Never   Smokeless tobacco: Never  Vaping Use   Vaping status: Never Used  Substance and Sexual Activity   Alcohol use: Not Currently    Comment: as a teenager   Drug use: Never   Sexual activity: Yes  Other Topics Concern   Not on file  Social History Narrative   Married   Two sons born 1995 and 2000   Work - real estate   Never smoker no alcohol tobacco or drug use   Social Drivers of Corporate investment banker Strain: Low Risk  (01/13/2024)   Overall Financial Resource Strain (CARDIA)    Difficulty of Paying Living Expenses: Not hard at all  Food Insecurity: No Food Insecurity  (01/13/2024)   Hunger Vital Sign    Worried About Running Out of Food in the Last Year: Never true    Ran Out of Food in the Last Year: Never true  Transportation Needs: No Transportation Needs (01/13/2024)   PRAPARE - Administrator, Civil Service (Medical): No    Lack of Transportation (Non-Medical): No  Physical Activity: Unknown (01/13/2024)   Exercise Vital Sign    Days of Exercise per Week: 0 days    Minutes of Exercise per Session: Not on file  Stress: No Stress Concern Present (01/13/2024)   [employer] of Occupational Health - Occupational Stress Questionnaire    Feeling of Stress : Not at all  Social Connections: Socially Integrated (01/13/2024)   Social Connection and Isolation Panel [NHANES]    Frequency of Communication with Friends and Family: More than three times a week    Frequency of Social Gatherings with Friends and Family: More than three times a week    Attends Religious Services: More than 4 times per year    Active Member of [employer] or Organizations: Yes    Attends Engineer, structural: More than 4 times per year    Marital Status: Married     Observations/Objective: Appears well in NAD   Assessment and Plan:  Abnormal lab tests: Had recent blood work done and there  was a couple of very minor abnormalities which she wanted to review Reviewed blood work and discussed some of the possible causes Reviewed blood work in detail and answered all of his questions   Follow Up Instructions:    I discussed the assessment and treatment plan with the patient. The patient was provided an opportunity to ask questions and all were answered. The patient agreed with the plan and demonstrated an understanding of the instructions.   The patient was advised to call back or seek an in-person evaluation if the symptoms worsen or if the condition fails to improve as anticipated.    [name], MD

## 2024-01-23 NOTE — Patient Instructions (Signed)
 Peter [name]

## 2024-01-24 ENCOUNTER — Telehealth (INDEPENDENT_AMBULATORY_CARE_PROVIDER_SITE_OTHER): Admitting: Internal Medicine

## 2024-01-24 ENCOUNTER — Encounter: Payer: Self-pay | Admitting: Internal Medicine

## 2024-01-24 DIAGNOSIS — R899 Unspecified abnormal finding in specimens from other organs, systems and tissues: Secondary | ICD-10-CM

## 2024-01-25 DIAGNOSIS — Z8589 Personal history of malignant neoplasm of other organs and systems: Secondary | ICD-10-CM | POA: Diagnosis not present

## 2024-01-25 DIAGNOSIS — Z923 Personal history of irradiation: Secondary | ICD-10-CM | POA: Diagnosis not present

## 2024-01-25 DIAGNOSIS — Z08 Encounter for follow-up examination after completed treatment for malignant neoplasm: Secondary | ICD-10-CM | POA: Diagnosis not present

## 2024-01-29 ENCOUNTER — Encounter (HOSPITAL_COMMUNITY)
Admission: EM | Disposition: A | Discharge status: Home / Self Care | Payer: Self-pay | Source: Home / Self Care | Attending: Cardiology

## 2024-01-29 ENCOUNTER — Inpatient Hospital Stay (HOSPITAL_COMMUNITY)
Admission: EM | Admit: 2024-01-29 | Discharge: 2024-01-31 | DRG: 321 | Disposition: A | Discharge status: Home / Self Care | Attending: Cardiology | Admitting: Cardiology

## 2024-01-29 DIAGNOSIS — E785 Hyperlipidemia, unspecified: Secondary | ICD-10-CM | POA: Diagnosis not present

## 2024-01-29 DIAGNOSIS — Z8616 Personal history of COVID-19: Secondary | ICD-10-CM | POA: Diagnosis not present

## 2024-01-29 DIAGNOSIS — Z8589 Personal history of malignant neoplasm of other organs and systems: Secondary | ICD-10-CM | POA: Diagnosis not present

## 2024-01-29 DIAGNOSIS — Z923 Personal history of irradiation: Secondary | ICD-10-CM

## 2024-01-29 DIAGNOSIS — Z8042 Family history of malignant neoplasm of prostate: Secondary | ICD-10-CM

## 2024-01-29 DIAGNOSIS — R079 Chest pain, unspecified: Secondary | ICD-10-CM | POA: Diagnosis not present

## 2024-01-29 DIAGNOSIS — R0789 Other chest pain: Secondary | ICD-10-CM | POA: Diagnosis not present

## 2024-01-29 DIAGNOSIS — I213 ST elevation (STEMI) myocardial infarction of unspecified site: Principal | ICD-10-CM

## 2024-01-29 DIAGNOSIS — K219 Gastro-esophageal reflux disease without esophagitis: Secondary | ICD-10-CM | POA: Diagnosis present

## 2024-01-29 DIAGNOSIS — I2119 ST elevation (STEMI) myocardial infarction involving other coronary artery of inferior wall: Secondary | ICD-10-CM | POA: Diagnosis not present

## 2024-01-29 DIAGNOSIS — R578 Other shock: Secondary | ICD-10-CM | POA: Diagnosis not present

## 2024-01-29 DIAGNOSIS — Z955 Presence of coronary angioplasty implant and graft: Secondary | ICD-10-CM

## 2024-01-29 DIAGNOSIS — Z8249 Family history of ischemic heart disease and other diseases of the circulatory system: Secondary | ICD-10-CM | POA: Diagnosis not present

## 2024-01-29 DIAGNOSIS — Z833 Family history of diabetes mellitus: Secondary | ICD-10-CM

## 2024-01-29 DIAGNOSIS — Z79899 Other long term (current) drug therapy: Secondary | ICD-10-CM

## 2024-01-29 DIAGNOSIS — I462 Cardiac arrest due to underlying cardiac condition: Secondary | ICD-10-CM | POA: Diagnosis not present

## 2024-01-29 DIAGNOSIS — I4901 Ventricular fibrillation: Secondary | ICD-10-CM | POA: Diagnosis not present

## 2024-01-29 DIAGNOSIS — R0689 Other abnormalities of breathing: Secondary | ICD-10-CM | POA: Diagnosis not present

## 2024-01-29 DIAGNOSIS — I2121 ST elevation (STEMI) myocardial infarction involving left circumflex coronary artery: Secondary | ICD-10-CM | POA: Diagnosis present

## 2024-01-29 DIAGNOSIS — I2510 Atherosclerotic heart disease of native coronary artery without angina pectoris: Secondary | ICD-10-CM

## 2024-01-29 DIAGNOSIS — I499 Cardiac arrhythmia, unspecified: Secondary | ICD-10-CM | POA: Diagnosis not present

## 2024-01-29 DIAGNOSIS — E872 Acidosis, unspecified: Secondary | ICD-10-CM | POA: Diagnosis not present

## 2024-01-29 DIAGNOSIS — I469 Cardiac arrest, cause unspecified: Secondary | ICD-10-CM | POA: Insufficient documentation

## 2024-01-29 DIAGNOSIS — I10 Essential (primary) hypertension: Secondary | ICD-10-CM | POA: Diagnosis not present

## 2024-01-29 DIAGNOSIS — R404 Transient alteration of awareness: Secondary | ICD-10-CM | POA: Diagnosis not present

## 2024-01-29 HISTORY — PX: CORONARY/GRAFT ACUTE MI REVASCULARIZATION: CATH118305

## 2024-01-29 HISTORY — PX: RIGHT HEART CATH: CATH118263

## 2024-01-29 HISTORY — PX: LEFT HEART CATH AND CORONARY ANGIOGRAPHY: CATH118249

## 2024-01-29 LAB — CBC WITH DIFFERENTIAL/PLATELET
Abs Immature Granulocytes: 0.2 10*3/uL — ABNORMAL HIGH (ref 0.00–0.07)
Basophils Absolute: 0.1 10*3/uL (ref 0.0–0.1)
Basophils Relative: 1 %
Eosinophils Absolute: 0.1 10*3/uL (ref 0.0–0.5)
Eosinophils Relative: 1 %
HCT: 41 % (ref 39.0–52.0)
Hemoglobin: 13.5 g/dL (ref 13.0–17.0)
Immature Granulocytes: 3 %
Lymphocytes Relative: 19 %
Lymphs Abs: 1.3 10*3/uL (ref 0.7–4.0)
MCH: 31.3 pg (ref 26.0–34.0)
MCHC: 32.9 g/dL (ref 30.0–36.0)
MCV: 94.9 fL (ref 80.0–100.0)
Monocytes Absolute: 0.7 10*3/uL (ref 0.1–1.0)
Monocytes Relative: 10 %
Neutro Abs: 4.7 10*3/uL (ref 1.7–7.7)
Neutrophils Relative %: 66 %
Platelets: 184 10*3/uL (ref 150–400)
RBC: 4.32 MIL/uL (ref 4.22–5.81)
RDW: 12.7 % (ref 11.5–15.5)
WBC: 7.1 10*3/uL (ref 4.0–10.5)
nRBC: 0 % (ref 0.0–0.2)

## 2024-01-29 LAB — POCT ACTIVATED CLOTTING TIME
Activated Clotting Time: 239 s
Activated Clotting Time: 302 s

## 2024-01-29 LAB — COMPREHENSIVE METABOLIC PANEL WITH GFR
ALT: 27 U/L (ref 0–44)
AST: 34 U/L (ref 15–41)
Albumin: 3.2 g/dL — ABNORMAL LOW (ref 3.5–5.0)
Alkaline Phosphatase: 52 U/L (ref 38–126)
Anion gap: 13 (ref 5–15)
BUN: 12 mg/dL (ref 6–20)
CO2: 18 mmol/L — ABNORMAL LOW (ref 22–32)
Calcium: 8.2 mg/dL — ABNORMAL LOW (ref 8.9–10.3)
Chloride: 105 mmol/L (ref 98–111)
Creatinine, Ser: 0.95 mg/dL (ref 0.61–1.24)
GFR, Estimated: >60 mL/min (ref >60)
Glucose, Bld: 156 mg/dL — ABNORMAL HIGH (ref 70–99)
Potassium: 3.2 mmol/L — ABNORMAL LOW (ref 3.5–5.1)
Sodium: 136 mmol/L (ref 135–145)
Total Bilirubin: 0.4 mg/dL (ref 0.0–1.2)
Total Protein: 5.8 g/dL — ABNORMAL LOW (ref 6.5–8.1)

## 2024-01-29 LAB — POCT I-STAT EG7
Acid-Base Excess: 0 mmol/L (ref 0.0–2.0)
Acid-Base Excess: 1 mmol/L (ref 0.0–2.0)
Bicarbonate: 27.9 mmol/L (ref 20.0–28.0)
Bicarbonate: 28.3 mmol/L — ABNORMAL HIGH (ref 20.0–28.0)
Calcium, Ion: 1.07 mmol/L — ABNORMAL LOW (ref 1.15–1.40)
Calcium, Ion: 1.09 mmol/L — ABNORMAL LOW (ref 1.15–1.40)
HCT: 43 % (ref 39.0–52.0)
HCT: 43 % (ref 39.0–52.0)
Hemoglobin: 14.6 g/dL (ref 13.0–17.0)
Hemoglobin: 14.6 g/dL (ref 13.0–17.0)
O2 Saturation: 73 %
O2 Saturation: 76 %
Potassium: 3.5 mmol/L (ref 3.5–5.1)
Potassium: 3.5 mmol/L (ref 3.5–5.1)
Sodium: 140 mmol/L (ref 135–145)
Sodium: 140 mmol/L (ref 135–145)
TCO2: 30 mmol/L (ref 22–32)
TCO2: 30 mmol/L (ref 22–32)
pCO2, Ven: 53 mmHg (ref 44–60)
pCO2, Ven: 55.3 mmHg (ref 44–60)
pH, Ven: 7.311 (ref 7.25–7.43)
pH, Ven: 7.335 (ref 7.25–7.43)
pO2, Ven: 42 mmHg (ref 32–45)
pO2, Ven: 46 mmHg — ABNORMAL HIGH (ref 32–45)

## 2024-01-29 LAB — CG4 I-STAT (LACTIC ACID)
Lactic Acid, Venous: 6 mmol/L (ref 0.5–1.9)
Lactic Acid, Venous: 1.3 mmol/L (ref 0.5–1.9)
Lactic Acid, Venous: 0.9 mmol/L (ref 0.5–1.9)

## 2024-01-29 LAB — APTT: aPTT: 28 s (ref 24–36)

## 2024-01-29 LAB — MAGNESIUM: Magnesium: 1.4 mg/dL — ABNORMAL LOW (ref 1.7–2.4)

## 2024-01-29 LAB — POCT I-STAT 7, (LYTES, BLD GAS, ICA,H+H)
Acid-Base Excess: 0 mmol/L (ref 0.0–2.0)
Acid-base deficit: 6 mmol/L — ABNORMAL HIGH (ref 0.0–2.0)
Bicarbonate: 18 mmol/L — ABNORMAL LOW (ref 20.0–28.0)
Bicarbonate: 27.3 mmol/L (ref 20.0–28.0)
Calcium, Ion: 1.06 mmol/L — ABNORMAL LOW (ref 1.15–1.40)
Calcium, Ion: 1.08 mmol/L — ABNORMAL LOW (ref 1.15–1.40)
HCT: 41 % (ref 39.0–52.0)
HCT: 41 % (ref 39.0–52.0)
Hemoglobin: 13.9 g/dL (ref 13.0–17.0)
Hemoglobin: 13.9 g/dL (ref 13.0–17.0)
O2 Saturation: 96 %
O2 Saturation: 95 %
Potassium: 2.9 mmol/L — ABNORMAL LOW (ref 3.5–5.1)
Potassium: 3.2 mmol/L — ABNORMAL LOW (ref 3.5–5.1)
Sodium: 133 mmol/L — ABNORMAL LOW (ref 135–145)
Sodium: 138 mmol/L (ref 135–145)
TCO2: 19 mmol/L — ABNORMAL LOW (ref 22–32)
TCO2: 29 mmol/L (ref 22–32)
pCO2 arterial: 29.5 mmHg — ABNORMAL LOW (ref 32–48)
pCO2 arterial: 52.6 mmHg — ABNORMAL HIGH (ref 32–48)
pH, Arterial: 7.393 (ref 7.35–7.45)
pH, Arterial: 7.322 — ABNORMAL LOW (ref 7.35–7.45)
pO2, Arterial: 80 mmHg — ABNORMAL LOW (ref 83–108)
pO2, Arterial: 84 mmHg (ref 83–108)

## 2024-01-29 LAB — LIPID PANEL
Cholesterol: 112 mg/dL (ref 0–200)
HDL: 30 mg/dL — ABNORMAL LOW (ref >40)
LDL Cholesterol: 39 mg/dL (ref 0–99)
Total CHOL/HDL Ratio: 3.7 ratio
Triglycerides: 213 mg/dL — ABNORMAL HIGH (ref <150)
VLDL: 43 mg/dL — ABNORMAL HIGH (ref 0–40)

## 2024-01-29 LAB — TROPONIN I (HIGH SENSITIVITY)
Troponin I (High Sensitivity): 17 ng/L (ref <18)
Troponin I (High Sensitivity): 2473 ng/L (ref <18)

## 2024-01-29 LAB — MRSA NEXT GEN BY PCR, NASAL: MRSA by PCR Next Gen: NOT DETECTED

## 2024-01-29 LAB — PROTIME-INR
INR: 1 (ref 0.8–1.2)
Prothrombin Time: 13.6 s (ref 11.4–15.2)

## 2024-01-29 LAB — GLUCOSE, CAPILLARY
Glucose-Capillary: 182 mg/dL — ABNORMAL HIGH (ref 70–99)
Glucose-Capillary: 166 mg/dL — ABNORMAL HIGH (ref 70–99)

## 2024-01-29 SURGERY — CORONARY/GRAFT ACUTE MI REVASCULARIZATION
Anesthesia: LOCAL

## 2024-01-29 MED ORDER — POTASSIUM CHLORIDE 20 MEQ PO PACK
40.0000 meq | PACK | Freq: Once | ORAL | Status: AC
  Administered 2024-01-29: 40 meq via ORAL
  Filled 2024-01-29: qty 2

## 2024-01-29 MED ORDER — AMIODARONE HCL IN DEXTROSE 360-4.14 MG/200ML-% IV SOLN: 60.0000 mg/h | INTRAVENOUS | Status: DC

## 2024-01-29 MED ORDER — NOREPINEPHRINE 4 MG/250ML-% IV SOLN
0.0000 ug/min | INTRAVENOUS | Status: DC
  Administered 2024-01-29: 5 ug/min via INTRAVENOUS

## 2024-01-29 MED ORDER — ATORVASTATIN CALCIUM 80 MG PO TABS
80.0000 mg | ORAL_TABLET | Freq: Every day | ORAL | Status: DC
  Administered 2024-01-29 – 2024-01-31 (×3): 80 mg via ORAL
  Filled 2024-01-29 (×3): qty 1

## 2024-01-29 MED ORDER — AMIODARONE HCL IN DEXTROSE 360-4.14 MG/200ML-% IV SOLN
INTRAVENOUS | Status: AC
  Filled 2024-01-29: qty 200

## 2024-01-29 MED ORDER — ONDANSETRON HCL 4 MG/2ML IJ SOLN
4.0000 mg | Freq: Four times a day (QID) | INTRAMUSCULAR | Status: DC | PRN
  Administered 2024-01-29: 4 mg via INTRAVENOUS
  Filled 2024-01-29: qty 2

## 2024-01-29 MED ORDER — LIDOCAINE HCL (PF) 1 % IJ SOLN
INTRAMUSCULAR | Status: DC | PRN
Start: 2024-01-29 — End: 2024-01-29
  Administered 2024-01-29: 5 mL
  Administered 2024-01-29: 2 mL

## 2024-01-29 MED ORDER — SODIUM CHLORIDE 0.9 % IV SOLN: INTRAVENOUS | Status: AC

## 2024-01-29 MED ORDER — FENTANYL CITRATE (PF) 100 MCG/2ML IJ SOLN
INTRAMUSCULAR | Status: DC | PRN
  Administered 2024-01-29: 25 ug via INTRAVENOUS

## 2024-01-29 MED ORDER — VERAPAMIL HCL 2.5 MG/ML IV SOLN
INTRAVENOUS | Status: AC
  Filled 2024-01-29: qty 2

## 2024-01-29 MED ORDER — ORAL CARE MOUTH RINSE: 15.0000 mL | OROMUCOSAL | Status: DC | PRN

## 2024-01-29 MED ORDER — SODIUM CHLORIDE 0.9 % IV SOLN
250.0000 mL | INTRAVENOUS | Status: AC
  Administered 2024-01-29: 250 mL via INTRAVENOUS

## 2024-01-29 MED ORDER — NOREPINEPHRINE 4 MG/250ML-% IV SOLN: 0.0000 ug/min | INTRAVENOUS | Status: DC

## 2024-01-29 MED ORDER — AMIODARONE LOAD VIA INFUSION
300.0000 mg | Freq: Once | INTRAVENOUS | Status: DC
  Filled 2024-01-29: qty 166.67

## 2024-01-29 MED ORDER — NITROGLYCERIN 0.4 MG SL SUBL: 0.4000 mg | SUBLINGUAL_TABLET | SUBLINGUAL | Status: DC | PRN

## 2024-01-29 MED ORDER — HEPARIN (PORCINE) IN NACL 2000-0.9 UNIT/L-% IV SOLN
INTRAVENOUS | Status: DC | PRN
  Administered 2024-01-29: 1000 mL

## 2024-01-29 MED ORDER — ASPIRIN 81 MG PO CHEW
81.0000 mg | CHEWABLE_TABLET | Freq: Every day | ORAL | Status: DC
  Administered 2024-01-30 – 2024-01-31 (×2): 81 mg via ORAL
  Filled 2024-01-29 (×2): qty 1

## 2024-01-29 MED ORDER — ONDANSETRON HCL 4 MG/2ML IJ SOLN
INTRAMUSCULAR | Status: AC
  Filled 2024-01-29: qty 2

## 2024-01-29 MED ORDER — AMIODARONE LOAD VIA INFUSION
150.0000 mg | Freq: Once | INTRAVENOUS | Status: DC
  Filled 2024-01-29: qty 83.34

## 2024-01-29 MED ORDER — POTASSIUM CHLORIDE 10 MEQ/100ML IV SOLN
10.0000 meq | INTRAVENOUS | Status: AC
  Administered 2024-01-29: 10 meq via INTRAVENOUS

## 2024-01-29 MED ORDER — ONDANSETRON HCL 4 MG/2ML IJ SOLN: 4.0000 mg | Freq: Four times a day (QID) | INTRAMUSCULAR | Status: DC | PRN

## 2024-01-29 MED ORDER — SODIUM CHLORIDE 0.9 % IV BOLUS
INTRAVENOUS | Status: AC | PRN
  Administered 2024-01-29: 250 mL/h via INTRAVENOUS

## 2024-01-29 MED ORDER — SODIUM CHLORIDE 0.9 % IV SOLN: 250.0000 mL | INTRAVENOUS | Status: AC | PRN

## 2024-01-29 MED ORDER — NOREPINEPHRINE 4 MG/250ML-% IV SOLN
INTRAVENOUS | Status: AC
  Filled 2024-01-29: qty 250

## 2024-01-29 MED ORDER — MIDAZOLAM HCL 2 MG/2ML IJ SOLN
INTRAMUSCULAR | Status: DC | PRN
  Administered 2024-01-29: 1 mg via INTRAVENOUS

## 2024-01-29 MED ORDER — SODIUM CHLORIDE 0.9% FLUSH
3.0000 mL | Freq: Two times a day (BID) | INTRAVENOUS | Status: DC
  Administered 2024-01-29 – 2024-01-31 (×4): 3 mL via INTRAVENOUS

## 2024-01-29 MED ORDER — SODIUM BICARBONATE 8.4 % IV SOLN
INTRAVENOUS | Status: AC
  Filled 2024-01-29: qty 100

## 2024-01-29 MED ORDER — LIDOCAINE HCL (PF) 1 % IJ SOLN
INTRAMUSCULAR | Status: AC
  Filled 2024-01-29: qty 30

## 2024-01-29 MED ORDER — MIDAZOLAM HCL 2 MG/2ML IJ SOLN
INTRAMUSCULAR | Status: AC
  Filled 2024-01-29: qty 2

## 2024-01-29 MED ORDER — AMIODARONE HCL IN DEXTROSE 360-4.14 MG/200ML-% IV SOLN
30.0000 mg/h | INTRAVENOUS | Status: AC
  Administered 2024-01-29 – 2024-01-30 (×3): 30 mg/h via INTRAVENOUS
  Filled 2024-01-29: qty 200

## 2024-01-29 MED ORDER — AMIODARONE HCL IN DEXTROSE 360-4.14 MG/200ML-% IV SOLN
30.0000 mg/h | INTRAVENOUS | Status: DC
  Administered 2024-01-29: 30 mg/h via INTRAVENOUS

## 2024-01-29 MED ORDER — HEPARIN SODIUM (PORCINE) 5000 UNIT/ML IJ SOLN
4000.0000 [IU] | Freq: Once | INTRAMUSCULAR | Status: AC
  Administered 2024-01-29: 4000 [IU] via INTRAVENOUS
  Filled 2024-01-29: qty 0.8

## 2024-01-29 MED ORDER — SODIUM BICARBONATE 8.4 % IV SOLN
INTRAVENOUS | Status: DC | PRN
  Administered 2024-01-29: 100 meq via INTRAVENOUS

## 2024-01-29 MED ORDER — ONDANSETRON HCL 4 MG/2ML IJ SOLN
INTRAMUSCULAR | Status: DC | PRN
  Administered 2024-01-29: 4 mg via INTRAVENOUS

## 2024-01-29 MED ORDER — HEPARIN SODIUM (PORCINE) 1000 UNIT/ML IJ SOLN
INTRAMUSCULAR | Status: DC | PRN
Start: 2024-01-29 — End: 2024-01-29
  Administered 2024-01-29: 3000 [IU] via INTRA_ARTERIAL
  Administered 2024-01-29: 5000 [IU] via INTRA_ARTERIAL

## 2024-01-29 MED ORDER — HEPARIN SODIUM (PORCINE) 1000 UNIT/ML IJ SOLN
INTRAMUSCULAR | Status: AC
  Filled 2024-01-29: qty 10

## 2024-01-29 MED ORDER — VERAPAMIL HCL 2.5 MG/ML IV SOLN
INTRAVENOUS | Status: DC | PRN
  Administered 2024-01-29: 10 mL via INTRA_ARTERIAL

## 2024-01-29 MED ORDER — MAGNESIUM SULFATE 2 GM/50ML IV SOLN
2.0000 g | Freq: Once | INTRAVENOUS | Status: AC
  Administered 2024-01-29: 2 g via INTRAVENOUS
  Filled 2024-01-29: qty 50

## 2024-01-29 MED ORDER — HEPARIN (PORCINE) IN NACL 1000-0.9 UT/500ML-% IV SOLN
INTRAVENOUS | Status: DC | PRN
Start: 2024-01-29 — End: 2024-01-29
  Administered 2024-01-29: 500 mL

## 2024-01-29 MED ORDER — HYDRALAZINE HCL 20 MG/ML IJ SOLN: 10.0000 mg | INTRAMUSCULAR | Status: AC | PRN

## 2024-01-29 MED ORDER — CHLORHEXIDINE GLUCONATE CLOTH 2 % EX PADS
6.0000 | MEDICATED_PAD | Freq: Every day | CUTANEOUS | Status: DC
  Administered 2024-01-29 – 2024-01-30 (×2): 6 via TOPICAL

## 2024-01-29 MED ORDER — FENTANYL CITRATE (PF) 100 MCG/2ML IJ SOLN
INTRAMUSCULAR | Status: AC
  Filled 2024-01-29: qty 2

## 2024-01-29 MED ORDER — TICAGRELOR 90 MG PO TABS
ORAL_TABLET | ORAL | Status: DC | PRN
  Administered 2024-01-29: 180 mg via ORAL

## 2024-01-29 MED ORDER — TICAGRELOR 90 MG PO TABS
90.0000 mg | ORAL_TABLET | Freq: Two times a day (BID) | ORAL | Status: DC
  Administered 2024-01-29: 90 mg via ORAL
  Filled 2024-01-29: qty 1

## 2024-01-29 MED ORDER — AMIODARONE HCL IN DEXTROSE 360-4.14 MG/200ML-% IV SOLN: 30.0000 mg/h | INTRAVENOUS | Status: DC

## 2024-01-29 MED ORDER — TICAGRELOR 90 MG PO TABS
ORAL_TABLET | ORAL | Status: AC
  Filled 2024-01-29: qty 4

## 2024-01-29 MED ORDER — SODIUM CHLORIDE 0.9% FLUSH: 3.0000 mL | INTRAVENOUS | Status: DC | PRN

## 2024-01-29 MED ORDER — ACETAMINOPHEN 325 MG PO TABS: 650.0000 mg | ORAL_TABLET | ORAL | Status: DC | PRN

## 2024-01-29 MED ORDER — LABETALOL HCL 5 MG/ML IV SOLN: 10.0000 mg | INTRAVENOUS | Status: AC | PRN

## 2024-01-29 SURGICAL SUPPLY — 25 items
BALLOON EMERGE MR 2.5X12 (BALLOONS) IMPLANT
BALLOON ~~LOC~~ EMERGE MR 3.5X12 (BALLOONS) IMPLANT
CATH INFINITI 5 FR JL3.5 (CATHETERS) IMPLANT
CATH INFINITI 5FR ANG PIGTAIL (CATHETERS) IMPLANT
CATH SWAN GANZ VIP 7.5F (CATHETERS) IMPLANT
CATH VISTA GUIDE 6FR JR4 ECOPK (CATHETERS) IMPLANT
CATH VISTA GUIDE 6FR XB3.5 EPK (CATHETERS) IMPLANT
COVER PRB 48X5XTLSCP FOLD TPE (BAG) IMPLANT
DEVICE RAD COMP TR BAND LRG (VASCULAR PRODUCTS) IMPLANT
GLIDESHEATH SLEND SS 6F .021 (SHEATH) IMPLANT
GUIDEWIRE INQWIRE 1.5J.035X260 (WIRE) IMPLANT
GUIDEWIRE VAS SION BLUE 190 (WIRE) IMPLANT
KIT ENCORE 26 ADVANTAGE (KITS) IMPLANT
KIT HEMO VALVE WATCHDOG (MISCELLANEOUS) IMPLANT
KIT SYRINGE INJ CVI SPIKEX1 (MISCELLANEOUS) IMPLANT
PACK CARDIAC CATHETERIZATION (CUSTOM PROCEDURE TRAY) ×1 IMPLANT
SET ATX-X65L (MISCELLANEOUS) IMPLANT
SHEATH PINNACLE 8F 10CM (SHEATH) IMPLANT
STATION PROTECTION PRESSURIZED (MISCELLANEOUS) IMPLANT
STENT SYNERGY XD 3.50X20 (Permanent Stent) IMPLANT
STENT SYNERGY XD 3.50X24 (Permanent Stent) IMPLANT
SYR MEDRAD MARK 7 150ML (SYRINGE) IMPLANT
TUBING CIL FLEX 10 FLL-RA (TUBING) IMPLANT
WIRE EMERALD 3MM-J .025X260CM (WIRE) IMPLANT
WIRE MICRO SET SILHO 5FR 7 (SHEATH) IMPLANT

## 2024-01-29 NOTE — Progress Notes (Signed)
 Patient had an infiltration of his right distal anterior arm PIV while en route with EMS per cath lab handoff. Pharmacy has been notified and site is stable at a grade 1 infiltration

## 2024-01-29 NOTE — H&P (Signed)
 Cardiology Admission History and Physical   Patient ID: ADRICK KESTLER MRN: [id number]; DOB: [birth year]   Admission date: 01/29/2024  PCP:  [name], MD   [facility] Providers Cardiologist:  None       Chief Complaint:  Chest pain  Patient Profile: Peter [name] is a 58 y.o. male with history of GERD, head and neck cancer s/p XRT, DL who is being seen 04/28/6212 for the evaluation of chest pain.  History of Present Illness: Mr. Ruhland is a 58 year old male with the above listed medical problems who developed scapular pain a few days ago.  It was of variable intensity and not associated with exertion.  Unfortunately he developed acute onset chest pain around 930 this morning.  Is associate with nausea and diaphoresis.  He called EMS and EKG demonstrated an inferolateral ST elevation myocardial infarction.  On route in the ambulance he did suffer a VF arrest requiring cardioversion x 1.  When I met the patient in the emergency department he was hemodynamically stable and well-perfused.  We loaded him with 300 mg of IV amiodarone and amiodarone drip was instituted.  He had already been treated with aspirin .  He received a 4000 unit heparin bolus in the emergency department.  Due to ongoing chest pain and inferior ST elevation myocardial infarction he is referred for emergency coronary angiography and possible percutaneous coronary invention.   Past Medical History:  Diagnosis Date   COVID 04/04/2021   states mild sx   Degenerative disc disease, lumbar    Dr. Leighton Punches   Dyslipidemia    GERD (gastroesophageal reflux disease)    Helicobacter pylori antibody positive 02/21/2005   treated with antibiotics   Past Surgical History:  Procedure Laterality Date   COLONOSCOPY  2013   normal   ESOPHAGOGASTRODUODENOSCOPY  2007   Dr. Elsie Halo   NASAL SINUS SURGERY  1986     Medications Prior to Admission: Prior to Admission medications   Medication Sig Start Date End Date Taking?  Authorizing Provider  famotidine (PEPCID) 20 MG tablet Take 20 mg by mouth as needed for heartburn or indigestion.    [provider]  fluticasone Odis Bennetts) 50 MCG/ACT nasal spray SMARTSIG:2 Puff(s) Both Nares Daily 11/17/23   [provider]  sodium fluoride  (FLUORISHIELD) 1.1 % GEL dental gel SMARTSIG:sparingly By Mouth 12/27/23   [provider]  sucralfate  (CARAFATE ) 1 g tablet Dissolve 1 tablet in 10 mL H20 and swallow 30 min prior to meals and bedtime. Swish in mouth first, if needed for mouth sores. Use up to 4 times daily. 07/26/23   Colie Dawes, MD     Allergies:   No Known Allergies  Social History:   Social History   Socioeconomic History   Marital status: Married    Spouse name: Not on file   Number of children: 2   Years of education: Not on file   Highest education level: Associate degree: occupational, Scientist, product/process development, or vocational program  Occupational History   Occupation: Catering manager: [employer]  Tobacco Use   Smoking status: Never   Smokeless tobacco: Never  Vaping Use   Vaping status: Never Used  Substance and Sexual Activity   Alcohol use: Not Currently    Comment: as a teenager   Drug use: Never   Sexual activity: Yes  Other Topics Concern   Not on file  Social History Narrative   Married   Two sons born 23 and 2000  Work - Research officer, political party   Never smoker no alcohol tobacco or drug use   Social Drivers of Corporate investment banker Strain: Low Risk  (01/13/2024)   Overall Financial Resource Strain (CARDIA)    Difficulty of Paying Living Expenses: Not hard at all  Food Insecurity: No Food Insecurity (01/13/2024)   Hunger Vital Sign    Worried About Running Out of Food in the Last Year: Never true    Ran Out of Food in the Last Year: Never true  Transportation Needs: No Transportation Needs (01/13/2024)   PRAPARE - Administrator, Civil Service (Medical): No    Lack of Transportation (Non-Medical):  No  Physical Activity: Unknown (01/13/2024)   Exercise Vital Sign    Days of Exercise per Week: 0 days    Minutes of Exercise per Session: Not on file  Stress: No Stress Concern Present (01/13/2024)   [employer] of Occupational Health - Occupational Stress Questionnaire    Feeling of Stress : Not at all  Social Connections: Socially Integrated (01/13/2024)   Social Connection and Isolation Panel [NHANES]    Frequency of Communication with Friends and Family: More than three times a week    Frequency of Social Gatherings with Friends and Family: More than three times a week    Attends Religious Services: More than 4 times per year    Active Member of [employer] or Organizations: Yes    Attends Engineer, structural: More than 4 times per year    Marital Status: Married  Catering manager Violence: Not At Risk (04/15/2023)   Humiliation, Afraid, Rape, and Kick questionnaire    Fear of Current or Ex-Partner: No    Emotionally Abused: No    Physically Abused: No    Sexually Abused: No     Family History:   The patient's family history includes Colon polyps in his brother, brother, and mother; Diabetes in his brother and father; Heart attack (age of onset: 65) in his brother; Heart disease in his brother; Heart disease (age of onset: 29) in his brother; Heart disease (age of onset: 31) in his father; Prostate cancer in his brother. There is no history of Colon cancer, Esophageal cancer, Rectal cancer, or Stomach cancer.    ROS:  Please see the history of present illness.  All other ROS reviewed and negative.     Physical Exam/Data: There were no vitals filed for this visit. No intake or output data in the 24 hours ending 01/29/24 1313    01/13/2024    1:12 PM 10/05/2023    2:19 PM 07/14/2023   11:11 AM  Last 3 Weights  Weight (lbs) 160 lb 159 lb 160 lb 6 oz  Weight (kg) 72.576 kg 72.122 kg 72.746 kg     There is no height or weight on file to calculate BMI.  General:  Well  nourished, well developed, in mild distress HEENT: normal Neck: no JVD Vascular: No carotid bruits; Distal pulses 2+ bilaterally   Cardiac:  normal S1, S2; RRR; no murmur  Lungs:  clear to auscultation bilaterally, no wheezing, rhonchi or rales  Abd: soft, nontender, no hepatomegaly  Ext: no edema Musculoskeletal:  No deformities, BUE and BLE strength normal and equal Skin: warm and dry  Neuro:  CNs 2-12 intact, no focal abnormalities noted Psych:  Normal affect   EKG:  The ECG that was done today was personally reviewed and demonstrates acute inferolateral ST elevation myocardial infarction  Relevant CV Studies:  Previous EKG demonstrates normal sinus rhythm this was done in 2025  Laboratory Data: High Sensitivity Troponin:  No results for input(s): "TROPONINIHS" in the last 720 hours.    Chemistry Recent Labs  Lab 01/29/24 1144  NA 136  K 3.2*  CL 105  CO2 18*  GLUCOSE 156*  BUN 12  CREATININE 0.95  CALCIUM 8.2*  GFRNONAA >60  ANIONGAP 13    Recent Labs  Lab 01/29/24 1144  PROT 5.8*  ALBUMIN 3.2*  AST 34  ALT 27  ALKPHOS 52  BILITOT 0.4   Lipids No results for input(s): "CHOL", "TRIG", "HDL", "LABVLDL", "LDLCALC", "CHOLHDL" in the last 168 hours. Hematology Recent Labs  Lab 01/29/24 1144  WBC 7.1  RBC 4.32  HGB 13.5  HCT 41.0  MCV 94.9  MCH 31.3  MCHC 32.9  RDW 12.7  PLT 184   Thyroid No results for input(s): "TSH", "FREET4" in the last 168 hours. BNPNo results for input(s): "BNP", "PROBNP" in the last 168 hours.  DDimer No results for input(s): "DDIMER" in the last 168 hours.  Radiology/Studies:  No results found.   Assessment and Plan: ST elevation myocardial infarction: Will refer for emergency coronary angiography.  Currently the patient is normotensive and looks well-perfused on exam.  He did suffer from VF arrest designating him as a high risk STEMI.  We will continue IV amiodarone. VF arrest: Continue IV amiodarone. Hyperlipidemia: Will  likely plan on high-dose statin. Head and neck cancer: Status post XRT per most recent oncology note February 2025 seems to be stable with no recurrence  Risk Assessment/Risk Scores:   TIMI Risk Score for ST  Elevation MI:   The patient's TIMI risk score is 0, which indicates a 0.8% risk of all cause mortality at 30 days.      Code Status: Full Code  Severity of Illness: The appropriate patient status for this patient is INPATIENT. Inpatient status is judged to be reasonable and necessary in order to provide the required intensity of service to ensure the patient's safety. The patient's presenting symptoms, physical exam findings, and initial radiographic and laboratory data in the context of their chronic comorbidities is felt to place them at high risk for further clinical deterioration. Furthermore, it is not anticipated that the patient will be medically stable for discharge from the hospital within 2 midnights of admission.   * I certify that at the point of admission it is my clinical judgment that the patient will require inpatient hospital care spanning beyond 2 midnights from the point of admission due to high intensity of service, high risk for further deterioration and high frequency of surveillance required.*  For questions or updates, please contact [facility] Please consult [email or web address] for contact info under     Signed, Cameka Rae K Modena Bellemare, MD  01/29/2024 1:13 PM

## 2024-01-29 NOTE — ED Provider Notes (Signed)
 [place] EMERGENCY DEPARTMENT AT [facility] Provider Note   CSN: [id number] Arrival date & time: 01/29/24  1139     History  Chief Complaint  Patient presents with   Code STEMI    Peter [name] is a 58 y.o. male.  HPI   Patient started having chest pain this morning at about 9:30 AM.  EMS was called.  Patient had an EKG suggestive of ST elevation MI.  Patient was activated as a code STEMI.    Home Medications Prior to Admission medications   Medication Sig Start Date End Date Taking? Authorizing Provider  famotidine (PEPCID) 20 MG tablet Take 20 mg by mouth as needed for heartburn or indigestion.    [provider]  fluticasone Odis Bennetts) 50 MCG/ACT nasal spray SMARTSIG:2 Puff(s) Both Nares Daily 11/17/23   [provider]  sodium fluoride  (FLUORISHIELD) 1.1 % GEL dental gel SMARTSIG:sparingly By Mouth 12/27/23   [provider]  sucralfate  (CARAFATE ) 1 g tablet Dissolve 1 tablet in 10 mL H20 and swallow 30 min prior to meals and bedtime. Swish in mouth first, if needed for mouth sores. Use up to 4 times daily. 07/26/23   Colie Dawes, MD      Allergies    Patient has no known allergies.    Review of Systems   Review of Systems  Physical Exam Updated Vital Signs There were no vitals taken for this visit. Physical Exam Vitals and nursing note reviewed.  Constitutional:      General: He is in acute distress.     Appearance: He is well-developed.  HENT:     Head: Normocephalic and atraumatic.     Right Ear: External ear normal.     Left Ear: External ear normal.  Eyes:     General: No scleral icterus.       Right eye: No discharge.        Left eye: No discharge.     Conjunctiva/sclera: Conjunctivae normal.  Neck:     Trachea: No tracheal deviation.  Cardiovascular:     Rate and Rhythm: Normal rate.  Pulmonary:     Effort: Pulmonary effort is normal. No respiratory distress.     Breath sounds: No stridor.  Abdominal:      General: There is no distension.  Musculoskeletal:        General: No swelling or deformity.     Cervical back: Neck supple.  Skin:    General: Skin is warm and dry.     Findings: No rash.  Neurological:     Mental Status: He is alert. Mental status is at baseline.     Cranial Nerves: No dysarthria or facial asymmetry.     Motor: No seizure activity.     ED Results / Procedures / Treatments   Labs (all labs ordered are listed, but only abnormal results are displayed) Labs Reviewed  HEMOGLOBIN A1C  CBC WITH DIFFERENTIAL/PLATELET  PROTIME-INR  APTT  COMPREHENSIVE METABOLIC PANEL WITH GFR  LIPID PANEL  I-STAT CG4 LACTIC ACID, ED  TROPONIN I (HIGH SENSITIVITY)    EKG None  Radiology No results found.  Procedures Procedures    Medications Ordered in ED Medications  amiodarone (NEXTERONE) 1.8 mg/mL load via infusion 300 mg (has no administration in time range)    Followed by  amiodarone (NEXTERONE PREMIX) 360-4.14 MG/200ML-% (1.8 mg/mL) IV infusion (has no administration in time range)    Followed by  amiodarone (NEXTERONE PREMIX) 360-4.14 MG/200ML-% (1.8 mg/mL) IV infusion (  has no administration in time range)    ED Course/ Medical Decision Making/ A&P                                 Medical Decision Making Amount and/or Complexity of Data Reviewed Labs: ordered.  Risk Decision regarding hospitalization.   Patient was activated as a code STEMI.  Cardiology is present at the bedside.  Patient will be going emergently to the Cath Lab for further treatment.  Patient is currently protecting his airway.  Patient was started on amiodarone.  He was given aspirin  by EMS.  Patient is currently protecting his airway.        Final Clinical Impression(s) / ED Diagnoses Final diagnoses:  ST elevation myocardial infarction (STEMI), unspecified artery [facility])    Rx / DC Orders ED Discharge Orders     None         Trish Furl, MD 01/29/24 1152

## 2024-01-29 NOTE — ED Triage Notes (Addendum)
 Pt BIB [facility] from [facility] as a code SEMI. Pt started having CP this morning around 930am . 7 ASA given by EMS

## 2024-01-30 ENCOUNTER — Inpatient Hospital Stay (HOSPITAL_COMMUNITY)

## 2024-01-30 ENCOUNTER — Encounter (HOSPITAL_COMMUNITY): Payer: Self-pay | Admitting: Internal Medicine

## 2024-01-30 ENCOUNTER — Other Ambulatory Visit: Payer: Self-pay

## 2024-01-30 DIAGNOSIS — E785 Hyperlipidemia, unspecified: Secondary | ICD-10-CM

## 2024-01-30 DIAGNOSIS — I2121 ST elevation (STEMI) myocardial infarction involving left circumflex coronary artery: Secondary | ICD-10-CM

## 2024-01-30 DIAGNOSIS — I4901 Ventricular fibrillation: Secondary | ICD-10-CM

## 2024-01-30 LAB — BASIC METABOLIC PANEL WITH GFR
Anion gap: 5 (ref 5–15)
BUN: 9 mg/dL (ref 6–20)
CO2: 25 mmol/L (ref 22–32)
Calcium: 7.8 mg/dL — ABNORMAL LOW (ref 8.9–10.3)
Chloride: 104 mmol/L (ref 98–111)
Creatinine, Ser: 0.76 mg/dL (ref 0.61–1.24)
GFR, Estimated: >60 mL/min (ref >60)
Glucose, Bld: 136 mg/dL — ABNORMAL HIGH (ref 70–99)
Potassium: 3.9 mmol/L (ref 3.5–5.1)
Sodium: 134 mmol/L — ABNORMAL LOW (ref 135–145)

## 2024-01-30 LAB — ECHOCARDIOGRAM COMPLETE
Area-P 1/2: 4.6 cm2
S' Lateral: 2.3 cm

## 2024-01-30 LAB — CBC
HCT: 41.1 % (ref 39.0–52.0)
Hemoglobin: 13.5 g/dL (ref 13.0–17.0)
MCH: 31.4 pg (ref 26.0–34.0)
MCHC: 32.8 g/dL (ref 30.0–36.0)
MCV: 95.6 fL (ref 80.0–100.0)
Platelets: 177 10*3/uL (ref 150–400)
RBC: 4.3 MIL/uL (ref 4.22–5.81)
RDW: 13.2 % (ref 11.5–15.5)
WBC: 17.2 10*3/uL — ABNORMAL HIGH (ref 4.0–10.5)
nRBC: 0 % (ref 0.0–0.2)

## 2024-01-30 LAB — MAGNESIUM: Magnesium: 2.4 mg/dL (ref 1.7–2.4)

## 2024-01-30 MED ORDER — ONDANSETRON HCL 4 MG PO TABS: 4.0000 mg | ORAL_TABLET | Freq: Three times a day (TID) | ORAL | Status: DC | PRN

## 2024-01-30 MED ORDER — PRASUGREL HCL 10 MG PO TABS
60.0000 mg | ORAL_TABLET | Freq: Once | ORAL | Status: AC
  Administered 2024-01-30: 60 mg via ORAL
  Filled 2024-01-30: qty 6

## 2024-01-30 MED ORDER — POTASSIUM CHLORIDE CRYS ER 20 MEQ PO TBCR
40.0000 meq | EXTENDED_RELEASE_TABLET | Freq: Once | ORAL | Status: AC
  Administered 2024-01-30: 40 meq via ORAL
  Filled 2024-01-30: qty 2

## 2024-01-30 MED ORDER — PRASUGREL HCL 10 MG PO TABS
10.0000 mg | ORAL_TABLET | Freq: Every day | ORAL | Status: DC
  Administered 2024-01-31: 10 mg via ORAL
  Filled 2024-01-30: qty 1

## 2024-01-30 MED ORDER — METOPROLOL SUCCINATE ER 25 MG PO TB24: 12.5000 mg | ORAL_TABLET | Freq: Every day | ORAL | Status: DC

## 2024-01-30 MED ORDER — LIDOCAINE 5 % EX PTCH
1.0000 | MEDICATED_PATCH | CUTANEOUS | Status: DC
  Administered 2024-01-30: 1 via TRANSDERMAL
  Filled 2024-01-30 (×2): qty 1

## 2024-01-30 NOTE — Progress Notes (Signed)
   Rounding Note    Patient Name: Peter [name] Date of Encounter: 01/30/2024  [facility] Cardiologist: None   Subjective   NAEO. Feeling OK this AM. Rib pain.  Vital Signs    Vitals:   01/30/24 0700 01/30/24 0800 01/30/24 0900 01/30/24 1000  BP: 96/68 100/69 99/67   Pulse: 80 79 76 78  Resp: 14 15 16 17   Temp: 99 F (37.2 C) 99 F (37.2 C) 99.1 F (37.3 C)   TempSrc:  Core    SpO2: 98% 96% 99% 98%    Intake/Output Summary (Last 24 hours) at 01/30/2024 1047 Last data filed at 01/30/2024 1000 Gross per 24 hour  Intake 2500.6 ml  Output 2550 ml  Net -49.4 ml      01/13/2024    1:12 PM 10/05/2023    2:19 PM 07/14/2023   11:11 AM  Last 3 Weights  Weight (lbs) 160 lb 159 lb 160 lb 6 oz  Weight (kg) 72.576 kg 72.122 kg 72.746 kg      Telemetry    AIVR yesterday, now improved. Sinus. - Personally Reviewed  ECG    Personally Reviewed  Physical Exam   GEN: No acute distress.   Cardiac: RRR, no murmurs, rubs, or gallops. Radial site healing well. Respiratory: Clear to auscultation bilaterally. Psych: Normal affect   Assessment & Plan    #STEMI #CAD CP free this AM. Electrically and hemodynamically stable this AM. Cont aspirin  and prasugrel Cont statin  #VF Arrest In setting of STEMI. Rhythm is stable. Anticipate transitioning to oral amiodarone tomorrow if remains stable.  Maintain ICU level of care for 24 more hours.  Donelda Fujita T. Marven Slimmer, MD, [facility], [facility] Cardiac Electrophysiology

## 2024-01-30 NOTE — Plan of Care (Signed)

## 2024-01-30 NOTE — Progress Notes (Signed)
 Advanced Heart Failure Rounding Note  Cardiologist: None  Chief Complaint:  Subjective:    Overall feeling well this morning, no further issues with arrhythmias, has been monitored for 24 hours in the ICU. LVEF low normal function, 50-55%, WMA consistent with Lcx infarct.    Objective:   Weight Range:   There is no height or weight on file to calculate BMI.   Vital Signs:   Temp:  [96.3 F (35.7 C)-99.3 F (37.4 C)] 99.1 F (37.3 C) (06/08 0900) Pulse Rate:  [61-82] 82 (06/08 1200) Resp:  [12-20] 14 (06/08 1200) BP: (90-117)/(62-88) 102/69 (06/08 1200) SpO2:  [83 %-100 %] 94 % (06/08 1200) Last BM Date :  (PTA)  Weight change: There were no vitals filed for this visit.  Intake/Output:   Intake/Output Summary (Last 24 hours) at 01/30/2024 1357 Last data filed at 01/30/2024 1200 Gross per 24 hour  Intake 2653.93 ml  Output 3350 ml  Net -696.07 ml      Physical Exam    General:  Well appearing. No resp difficulty Cor: RRR, no m/g/r, no JVP, RIJ swan in place Lungs: Clear Abdomen: Soft, nontender, nondistended. No hepatosplenomegaly. Neuro: Alert & orientedx3, cranial nerves grossly intact. moves all 4 extremities w/o difficulty. Affect pleasant   Telemetry   Normal sinus rhythm  Labs    CBC Recent Labs    01/29/24 1144 01/29/24 1215 01/29/24 1304 01/30/24 0408  WBC 7.1  --   --  17.2*  NEUTROABS 4.7  --   --   --   HGB 13.5   < > 14.6 13.5  HCT 41.0   < > 43.0 41.1  MCV 94.9  --   --  95.6  PLT 184  --   --  177   < > = values in this interval not displayed.   Basic Metabolic Panel Recent Labs    40/98/11 1144 01/29/24 1215 01/29/24 1304 01/29/24 1344 01/30/24 0408  NA 136   < > 140  --  134*  K 3.2*   < > 3.5  --  3.9  CL 105  --   --   --  104  CO2 18*  --   --   --  25  GLUCOSE 156*  --   --   --  136*  BUN 12  --   --   --  9  CREATININE 0.95  --   --   --  0.76  CALCIUM 8.2*  --   --   --  7.8*  MG  --   --   --  1.4* 2.4   < >  = values in this interval not displayed.   Liver Function Tests Recent Labs    01/29/24 1144  AST 34  ALT 27  ALKPHOS 52  BILITOT 0.4  PROT 5.8*  ALBUMIN 3.2*    Medications:     Scheduled Medications:  aspirin   81 mg Oral Daily   atorvastatin  80 mg Oral Daily   Chlorhexidine Gluconate Cloth  6 each Topical Daily   lidocaine   1 patch Transdermal Q24H   [START ON 01/31/2024] prasugrel  10 mg Oral Daily   sodium chloride  flush  3 mL Intravenous Q12H    Infusions:  sodium chloride      amiodarone 30 mg/hr (01/30/24 1200)    PRN Medications: sodium chloride , acetaminophen , nitroGLYCERIN, ondansetron  (ZOFRAN ) IV, ondansetron  (ZOFRAN ) IV, mouth rinse, sodium chloride  flush    Patient Profile  Patient is a 58 year old male with a PMH of GERD, head and neck cancer s/p XRT who presented with inferior STEMI.  Assessment/Plan   Lcx STEMI: Complicated by VF arrest, initially elevated lactic acid. Swan numbers show low filling pressures, normal CO/CI. Lactic cleared and BP improved. Currently stable, some MSK chest pain from compressions. - Transition to prasugrel given insurance issues - Continue aspirin  daily - Start metoprolol succinate 12.5mg  daily - Continue atorvastatin 80mg  daily - EF relatively preserved, should not need LifeVest, BP soft and no indication for RAASi - Downgrade, stop IV amiodarone this evening - Cardiac rehab at discharge  HLD: Strong family history of CAD. - Continue atorvastatin   Medication concerns reviewed with patient and pharmacy team. Barriers identified: Insurance issues with P2Y12  Length of Stay: 1  Lauralee Poll, MD  01/30/2024, 1:57 PM  Advanced Heart Failure Team Pager [phone] (M-F; 7a - 5p)  Please contact [facility] for night-coverage after hours (5p -7a ) and weekends on [email or web address]

## 2024-01-31 ENCOUNTER — Encounter (HOSPITAL_COMMUNITY): Payer: Self-pay | Admitting: Internal Medicine

## 2024-01-31 ENCOUNTER — Other Ambulatory Visit (HOSPITAL_COMMUNITY): Payer: Self-pay

## 2024-01-31 ENCOUNTER — Telehealth: Payer: Self-pay | Admitting: Cardiology

## 2024-01-31 DIAGNOSIS — I10 Essential (primary) hypertension: Secondary | ICD-10-CM | POA: Insufficient documentation

## 2024-01-31 DIAGNOSIS — I469 Cardiac arrest, cause unspecified: Secondary | ICD-10-CM | POA: Insufficient documentation

## 2024-01-31 LAB — CBC
HCT: 41 % (ref 39.0–52.0)
Hemoglobin: 13.2 g/dL (ref 13.0–17.0)
MCH: 30.6 pg (ref 26.0–34.0)
MCHC: 32.2 g/dL (ref 30.0–36.0)
MCV: 95.1 fL (ref 80.0–100.0)
Platelets: 157 10*3/uL (ref 150–400)
RBC: 4.31 MIL/uL (ref 4.22–5.81)
RDW: 13.2 % (ref 11.5–15.5)
WBC: 12.2 10*3/uL — ABNORMAL HIGH (ref 4.0–10.5)
nRBC: 0 % (ref 0.0–0.2)

## 2024-01-31 LAB — HEMOGLOBIN A1C
Hgb A1c MFr Bld: 6 % — ABNORMAL HIGH (ref 4.8–5.6)
Mean Plasma Glucose: 126 mg/dL

## 2024-01-31 LAB — BASIC METABOLIC PANEL WITH GFR
Anion gap: 10 (ref 5–15)
BUN: 9 mg/dL (ref 6–20)
CO2: 21 mmol/L — ABNORMAL LOW (ref 22–32)
Calcium: 8.5 mg/dL — ABNORMAL LOW (ref 8.9–10.3)
Chloride: 108 mmol/L (ref 98–111)
Creatinine, Ser: 0.89 mg/dL (ref 0.61–1.24)
GFR, Estimated: >60 mL/min (ref >60)
Glucose, Bld: 104 mg/dL — ABNORMAL HIGH (ref 70–99)
Potassium: 3.8 mmol/L (ref 3.5–5.1)
Sodium: 139 mmol/L (ref 135–145)

## 2024-01-31 LAB — MAGNESIUM: Magnesium: 2 mg/dL (ref 1.7–2.4)

## 2024-01-31 LAB — LIPOPROTEIN A (LPA): Lipoprotein (a): 12.3 nmol/L (ref <75.0)

## 2024-01-31 MED ORDER — METOPROLOL SUCCINATE ER 25 MG PO TB24
25.0000 mg | ORAL_TABLET | Freq: Every day | ORAL | 1 refills | Status: DC
  Filled 2024-01-31: qty 90, 90d supply, fill #0

## 2024-01-31 MED ORDER — ATORVASTATIN CALCIUM 80 MG PO TABS
80.0000 mg | ORAL_TABLET | Freq: Every day | ORAL | 1 refills | Status: DC
  Filled 2024-01-31: qty 90, 90d supply, fill #0

## 2024-01-31 MED ORDER — METOPROLOL SUCCINATE ER 25 MG PO TB24
25.0000 mg | ORAL_TABLET | Freq: Every day | ORAL | Status: DC
  Administered 2024-01-31: 25 mg via ORAL
  Filled 2024-01-31: qty 1

## 2024-01-31 MED ORDER — PRASUGREL HCL 10 MG PO TABS
10.0000 mg | ORAL_TABLET | Freq: Every day | ORAL | 2 refills | Status: AC
  Filled 2024-01-31: qty 90, 90d supply, fill #0
  Filled 2024-05-01: qty 30, 30d supply, fill #0
  Filled 2024-05-01: qty 60, 60d supply, fill #0
  Filled 2024-07-25: qty 90, 90d supply, fill #1

## 2024-01-31 MED ORDER — ASPIRIN 81 MG PO CHEW
81.0000 mg | CHEWABLE_TABLET | Freq: Every day | ORAL | 2 refills | Status: AC
  Filled 2024-01-31: qty 90, 90d supply, fill #0

## 2024-01-31 MED ORDER — NITROGLYCERIN 0.4 MG SL SUBL
0.4000 mg | SUBLINGUAL_TABLET | SUBLINGUAL | 2 refills | Status: AC | PRN
  Filled 2024-01-31: qty 25, 7d supply, fill #0
  Filled 2024-07-26: qty 25, 8d supply, fill #0

## 2024-01-31 NOTE — Progress Notes (Signed)
 CARDIAC REHAB PHASE I   Pt resting in bed, feeling well today. Just returned from walk in hallway with wife. Reports tolerating well with no CP, SOB or dizziness. Post MI/stent education including restrictions, risk factors, exercise guidelines, antiplatelet therapy importance, MI booklet, NTG use, heart healthy diet and CRP2 reviewed. All questions and concerns addressed. Will refer to [facility] for CRP2. Plan for discharge home later today.   [id number] Ronny Colas, RN BSN 01/31/2024 11:30 AM

## 2024-01-31 NOTE — Telephone Encounter (Signed)
   Transition of Care Follow-up Phone Call Request    Patient Name: ANIL HAVARD Date of Birth: 1966-05-16 Date of Encounter: 01/31/2024  Primary Care Provider:  [name], MD Primary Cardiologist:  Arun K Thukkani, MD  [name] has been scheduled for a transition of care follow up appointment with a [facility] provider:  Palmer Bobo 6/17  Please reach out to [name] within 48 hours of discharge to confirm appointment and review transition of care protocol questionnaire. Anticipated discharge date: 6/9  Johnie Nailer, NP  01/31/2024, 10:54 AM

## 2024-01-31 NOTE — Progress Notes (Signed)
   Rounding Note    Patient Name: Peter [name] Date of Encounter: 01/31/2024  [facility] Cardiologist: None   Subjective   NAEO. Feeling OK this AM. Rib pain.  Vital Signs    Vitals:   01/30/24 1800 01/30/24 1949 01/30/24 2248 01/31/24 0539  BP: 99/69 106/66 102/60 110/75  Pulse: 85 88 82 87  Resp: 18 16 17 15   Temp: 99.2 F (37.3 C) 99.2 F (37.3 C) 99.1 F (37.3 C) 98.3 F (36.8 C)  TempSrc: Oral Oral Oral Oral  SpO2: 97% 96% 95% 93%    Intake/Output Summary (Last 24 hours) at 01/31/2024 0727 Last data filed at 01/31/2024 0630 Gross per 24 hour  Intake 850.29 ml  Output 1550 ml  Net -699.71 ml      01/13/2024    1:12 PM 10/05/2023    2:19 PM 07/14/2023   11:11 AM  Last 3 Weights  Weight (lbs) 160 lb 159 lb 160 lb 6 oz  Weight (kg) 72.576 kg 72.122 kg 72.746 kg      Telemetry    SR. - Personally Reviewed  ECG    Personally Reviewed  Physical Exam   GEN: No acute distress.   Cardiac: RRR, no murmurs, rubs, or gallops. Radial site healing well. Respiratory: Clear to auscultation bilaterally. Psych: Normal affect  Vasc:  +2 radial pulses  Assessment & Plan    #STEMI:  STEMI 6/7 PCI Lcx DES x 1  -EF 50-55% with posterolateral HK, no significant valve issues  -ASA 81, prasugrel 10 x 1 year  -Atorvastatin 80  -PRN NTG   #VF Arrest:  No recurrence, increase Toprol to 25mg  Qday.   #HL:  Goal LDL <55 -Atorvastain 80  #Dispo:  D/C today with close hospital follow up.

## 2024-01-31 NOTE — Telephone Encounter (Signed)
 Pt discharged from [facility] today 01/31/24.  Triage nursing to place a TOC call to the pt on tomorrow 02/01/24.

## 2024-01-31 NOTE — Plan of Care (Signed)
  Problem: Education: Goal: Knowledge of General Education information will improve Description: Including pain rating scale, medication(s)/side effects and non-pharmacologic comfort measures Outcome: Progressing   Problem: Health Behavior/Discharge Planning: Goal: Ability to manage health-related needs will improve Outcome: Progressing   Problem: Clinical Measurements: Goal: Diagnostic test results will improve Outcome: Progressing Goal: Respiratory complications will improve Outcome: Progressing Goal: Cardiovascular complication will be avoided Outcome: Progressing   Problem: Activity: Goal: Risk for activity intolerance will decrease Outcome: Progressing   Problem: Nutrition: Goal: Adequate nutrition will be maintained Outcome: Progressing   Problem: Pain Managment: Goal: General experience of comfort will improve and/or be controlled Outcome: Progressing

## 2024-01-31 NOTE — TOC CM/SW Note (Signed)
 Transition of Care [facility]) - Inpatient Brief Assessment   Patient Details  Name: Peter [name] MRN: [id number] Date of Birth: Mar 30, 1966  [facility] of [facility] CM/SW Contact:    Juliane Och, LCSW Phone Number: 01/31/2024, 8:47 AM   Clinical Narrative:  8:47 AM Per chart review, patient resides at home with spouse. Patient has a PCP and insurance. Patient does not have SNF/HH/DME history. Patient's preferred pharmacy's are [facility] [address] and [facility]. No TOC needs were identified at this time. TOC will continue to follow and be available to assist.  Transition of Care Asessment: Insurance and Status: Insurance coverage has been reviewed Patient has primary care physician: Yes Home environment has been reviewed: Private Residence Prior level of function:: N/A Prior/Current Home Services: No current home services Social Drivers of Health Review: SDOH reviewed no interventions necessary Readmission risk has been reviewed: Yes Transition of care needs: no transition of care needs at this time

## 2024-01-31 NOTE — TOC Transition Note (Signed)
 Transition of Care [facility]) - Discharge Note   Patient Details  Name: Peter [name] MRN: [id number] Date of Birth: 05/26/1966  [facility] Care [facility]) CM/SW Contact:  Jeani Mill, RN Phone Number: 01/31/2024, 11:28 AM   Clinical Narrative:    [name] is stable to discharge home.  No TOC needs at this time.   Final next level of care: Home/Self Care Barriers to Discharge: Barriers Resolved   Patient Goals and CMS Choice Patient states their goals for this hospitalization and ongoing recovery are:: Return home          Discharge Placement               Home        Discharge Plan and Services Additional resources added to the After Visit Summary for                                       Social Drivers of Health (SDOH) Interventions SDOH Screenings   Food Insecurity: No Food Insecurity (01/30/2024)  Housing: Low Risk  (01/30/2024)  Transportation Needs: No Transportation Needs (01/30/2024)  Utilities: Not At Risk (01/30/2024)  Alcohol Screen: Low Risk  (04/06/2023)  Depression (PHQ2-9): Low Risk  (01/13/2024)  Financial Resource Strain: Low Risk  (01/13/2024)  Physical Activity: Unknown (01/13/2024)  Social Connections: Socially Integrated (01/13/2024)  Stress: No Stress Concern Present (01/13/2024)  Tobacco Use: Low Risk  (01/31/2024)  Health Literacy: Adequate Health Literacy (04/05/2023)     Readmission Risk Interventions     No data to display

## 2024-01-31 NOTE — Discharge Summary (Addendum)
 Discharge Summary   Patient ID: DECODA VAN MRN: [id number]; DOB: 1966/07/13  Admit date: 01/29/2024 Discharge date: 01/31/2024  PCP:  [name], MD   [facility] Providers Cardiologist:  Kyra Phy, MD      Discharge Diagnoses  Principal Problem:   STEMI involving left circumflex coronary artery [facility]) Active Problems:   Dyslipidemia   Hypertension   Cardiac arrest with ventricular fibrillation [facility])   Diagnostic Studies/Procedures   Cath: 01/29/2024    Mid Cx lesion is 100% stenosed.   A stent was successfully placed.   Post intervention, there is a 0% residual stenosis.   1.  Occluded mid left circumflex lesion treated with 1 drug-eluting stent. 2.  Due to VF arrest, elevated lactic acid, and ongoing shock of unknown etiology; right heart catheterization was pursued:            Fick cardiac output of 6.4 L/min and Fick cardiac index of 3.5 L/min/m                       Right atrial pressure mean of 2 mmHg                       Right ventricular pressure 19/4 with an end-diastolic pressure of 5 mmHg                       Wedge pressure mean of 13 mmHg                       PA pressure 25/12 with a mean of 17 mmHg                       PVR of less than 1 Wood unit                       PA pulsatility index of greater than 6 3.  LVEDP of 14 mmHg with preserved ventricular function by ventriculography with inferior apical hypokinesis 4.  VF arrest occurring during EMS transport; patient was treated with amiodarone bolus and infusion.   Recommendation: Judicious IV fluids, continue IV amiodarone given VF arrest, dual platelet therapy for at least 6 months and preferably 1 year, echocardiogram, and monitoring in the 2 heart unit.  The results were reviewed with Dr. Alease Amend.   Diagnostic Dominance: Right  Intervention   _____________   History of Present Illness   MIKHAEL HENDRIKS is a 58 y.o. male with history of GERD, head and neck cancer s/p XRT, DL  who was seen 4/0/9811 for the evaluation of chest pain. Mr. Monjaraz is a 58 year old male with the above listed medical problems who developed scapular pain a few days ago.  It was of variable intensity and not associated with exertion.  Unfortunately he developed acute onset chest pain around 930 this morning. Was associated with nausea and diaphoresis.  He called EMS and EKG demonstrated an inferolateral ST elevation myocardial infarction.  On route in the ambulance he did suffer a VF arrest requiring cardioversion x 1.  While in the patient in the emergency department he was hemodynamically stable and well-perfused.  He was loaded with 300 mg of IV amiodarone and amiodarone drip was instituted.  He had already been treated with aspirin .  He received a 4000 unit heparin bolus in the emergency department.  Due to ongoing chest pain and  inferior ST elevation myocardial infarction he was referred for emergency coronary angiography and possible percutaneous coronary invention.    Hospital Course    STEMI -- Underwent cardiac catheterization noted above with occluded mid circumflex lesion treated with PCI/DES x 1.  Recommendations for DAPT with aspirin /Effient for at least 1 year.  No recurrent chest pain.  Able to ambulate with cardiac rehab.  Echocardiogram with LVEF of 50 to 55%, posterior lateral hypokinesis, normal RV, no significant valvular disease. -- Continue aspirin , Effient, atorvastatin 80 mg daily  VF arrest -- While en route with EMS, cardioverted x 1.  Loaded with IV amiodarone on admission.  Right heart cath with low filling pressures, normal cardiac index/cardiac output with lactic acid cleared.  HLD -- LDL 39, HDL 30 -- Continue atorvastatin 80 mg daily  Did the patient have an acute coronary syndrome (MI, NSTEMI, STEMI, etc) this admission?:  Yes                               AHA/ACC ACS Clinical Performance & Quality Measures: Aspirin  prescribed? - Yes ADP Receptor Inhibitor  (Plavix/Clopidogrel, Brilinta/Ticagrelor or Effient/Prasugrel) prescribed (includes medically managed patients)? - Yes Beta Blocker prescribed? - Yes High Intensity Statin (Lipitor 40-80mg  or Crestor 20-40mg ) prescribed? - Yes EF assessed during THIS hospitalization? - Yes For EF <40%, was ACEI/ARB prescribed? - Not Applicable (EF >/= 40%) For EF <40%, Aldosterone Antagonist (Spironolactone or Eplerenone) prescribed? - Not Applicable (EF >/= 40%) Cardiac Rehab Phase II ordered (including medically managed patients)? - Yes   The patient will be scheduled for a TOC follow up appointment in 10-14 days.  A message has been sent to the [facility] and Scheduling Pool at the office where the patient should be seen for follow up.  _____________  Discharge Vitals Blood pressure 101/76, pulse 87, temperature 98.3 F (36.8 C), temperature source Oral, resp. rate 14, height 5\' 8"  (1.727 m), weight 70.6 kg, SpO2 95%.  Filed Weights   01/31/24 0957  Weight: 70.6 kg    Labs & Radiologic Studies  CBC Recent Labs    01/29/24 1144 01/29/24 1215 01/30/24 0408 01/31/24 0204  WBC 7.1  --  17.2* 12.2*  NEUTROABS 4.7  --   --   --   HGB 13.5   < > 13.5 13.2  HCT 41.0   < > 41.1 41.0  MCV 94.9  --  95.6 95.1  PLT 184  --  177 157   < > = values in this interval not displayed.   Basic Metabolic Panel Recent Labs    16/10/96 0408 01/31/24 0204  NA 134* 139  K 3.9 3.8  CL 104 108  CO2 25 21*  GLUCOSE 136* 104*  BUN 9 9  CREATININE 0.76 0.89  CALCIUM 7.8* 8.5*  MG 2.4 2.0   Liver Function Tests Recent Labs    01/29/24 1144  AST 34  ALT 27  ALKPHOS 52  BILITOT 0.4  PROT 5.8*  ALBUMIN 3.2*   No results for input(s): "LIPASE", "AMYLASE" in the last 72 hours. High Sensitivity Troponin:   Recent Labs  Lab 01/29/24 1144 01/29/24 1344  TROPONINIHS 17 2,473*    No results for input(s): "TRNPT" in the last 720 hours.  BNP Invalid input(s): "POCBNP" No results for input(s): "PROBNP"  in the last 72 hours.  No results for input(s): "BNP" in the last 72 hours.  D-Dimer No results for input(s): "DDIMER" in  the last 72 hours. Hemoglobin A1C Recent Labs    01/29/24 1144  HGBA1C 6.0*   Fasting Lipid Panel Recent Labs    01/29/24 1144  CHOL 112  HDL 30*  LDLCALC 39  TRIG 161*  CHOLHDL 3.7   No results found for: "LIPOA"  Thyroid Function Tests No results for input(s): "TSH", "T4TOTAL", "T3FREE", "THYROIDAB" in the last 72 hours.  Invalid input(s): "FREET3" _____________  ECHOCARDIOGRAM COMPLETE Result Date: 01/30/2024    ECHOCARDIOGRAM REPORT   Patient Name:   NASIRE REALI Date of Exam: 01/30/2024 Medical Rec #:  [id number]       Height:       68.0 in Accession #:    [id number]      Weight:       160.0 lb Date of Birth:  1966-07-18        BSA:          1.859 m Patient Age:    57 years        BP:           120/78 mmHg Patient Gender: M               HR:           74 bpm. Exam Location:  Inpatient Procedure: 2D Echo, Color Doppler, Cardiac Doppler and Strain Analysis (Both            Spectral and Color Flow Doppler were utilized during procedure). Indications:    Acute MI  History:        Patient has no prior history of Echocardiogram examinations.                 Acute MI, Signs/Symptoms:Chest Pain; Risk Factors:Dyslipidemia                 and Non-Smoker.  Sonographer:    Gelene Kelly RDCS Referring Phys: [id number] Dashonna Chagnon K Gerda Yin IMPRESSIONS  1. Posterior lateral hypokinesis . Left ventricular ejection fraction, by estimation, is 50 to 55%. The left ventricle has low normal function. The left ventricle demonstrates regional wall motion abnormalities (see scoring diagram/findings for description). Left ventricular diastolic parameters were normal.  2. Right ventricular systolic function is normal. The right ventricular size is normal.  3. The mitral valve is normal in structure. No evidence of mitral valve regurgitation. No evidence of mitral stenosis.  4. The aortic valve is  tricuspid. There is mild calcification of the aortic valve. There is mild thickening of the aortic valve. Aortic valve regurgitation is not visualized. Aortic valve sclerosis is present, with no evidence of aortic valve stenosis.  5. The inferior vena cava is normal in size with greater than 50% respiratory variability, suggesting right atrial pressure of 3 mmHg. FINDINGS  Left Ventricle: Posterior lateral hypokinesis. Left ventricular ejection fraction, by estimation, is 50 to 55%. The left ventricle has low normal function. The left ventricle demonstrates regional wall motion abnormalities. Strain was performed and the global longitudinal strain is indeterminate. The left ventricular internal cavity size was normal in size. There is no left ventricular hypertrophy. Left ventricular diastolic parameters were normal. Right Ventricle: The right ventricular size is normal. No increase in right ventricular wall thickness. Right ventricular systolic function is normal. Left Atrium: Left atrial size was normal in size. Right Atrium: Right atrial size was normal in size. Pericardium: There is no evidence of pericardial effusion. Mitral Valve: The mitral valve is normal in structure. No evidence of mitral valve regurgitation. No evidence of  mitral valve stenosis. Tricuspid Valve: The tricuspid valve is normal in structure. Tricuspid valve regurgitation is not demonstrated. No evidence of tricuspid stenosis. Aortic Valve: The aortic valve is tricuspid. There is mild calcification of the aortic valve. There is mild thickening of the aortic valve. Aortic valve regurgitation is not visualized. Aortic valve sclerosis is present, with no evidence of aortic valve stenosis. Pulmonic Valve: The pulmonic valve was normal in structure. Pulmonic valve regurgitation is not visualized. No evidence of pulmonic stenosis. Aorta: The aortic root is normal in size and structure. Venous: The inferior vena cava is normal in size with greater  than 50% respiratory variability, suggesting right atrial pressure of 3 mmHg. IAS/Shunts: No atrial level shunt detected by color flow Doppler. Additional Comments: 3D was performed not requiring image post processing on an independent workstation and was abnormal.  LEFT VENTRICLE PLAX 2D LVIDd:         3.90 cm   Diastology LVIDs:         2.30 cm   LV e' medial:    8.70 cm/s LV PW:         0.80 cm   LV E/e' medial:  11.1 LV IVS:        0.90 cm   LV e' lateral:   11.50 cm/s LVOT diam:     2.10 cm   LV E/e' lateral: 8.4 LV SV:         54 LV SV Index:   29 LVOT Area:     3.46 cm                           3D Volume EF:                          3D EF:        51 %                          LV EDV:       233 ml                          LV ESV:       114 ml                          LV SV:        119 ml RIGHT VENTRICLE RV S prime:     11.50 cm/s LEFT ATRIUM             Index        RIGHT ATRIUM           Index LA diam:        3.00 cm 1.61 cm/m   RA Area:     10.40 cm LA Vol (A2C):   29.0 ml 15.60 ml/m  RA Volume:   23.40 ml  12.59 ml/m LA Vol (A4C):   21.0 ml 11.30 ml/m LA Biplane Vol: 26.0 ml 13.99 ml/m  AORTIC VALVE LVOT Vmax:   88.60 cm/s LVOT Vmean:  55.500 cm/s LVOT VTI:    0.156 m  AORTA Ao Root diam: 3.60 cm Ao Asc diam:  3.20 cm MITRAL VALVE MV Area (PHT): 4.60 cm    SHUNTS MV Decel Time: 165 msec    Systemic VTI:  0.16 m MV E velocity: 96.90 cm/s  Systemic Diam: 2.10 cm MV A velocity: 69.20 cm/s MV E/A ratio:  1.40 Janelle Mediate MD Electronically signed by Janelle Mediate MD Signature Date/Time: 01/30/2024/10:48:42 AM    Final    CARDIAC CATHETERIZATION Addendum Date: 01/29/2024   Mid Cx lesion is 100% stenosed.   A stent was successfully placed.   Post intervention, there is a 0% residual stenosis. 1.  Occluded mid left circumflex lesion treated with 1 drug-eluting stent. 2.  Due to VF arrest, elevated lactic acid, and ongoing shock of unknown etiology; right heart catheterization was pursued:  Fick cardiac  output of 6.4 L/min and Fick cardiac index of 3.5 L/min/m   Right atrial pressure mean of 2 mmHg   Right ventricular pressure 19/4 with an end-diastolic pressure of 5 mmHg   Wedge pressure mean of 13 mmHg   PA pressure 25/12 with a mean of 17 mmHg   PVR of less than 1 Wood unit   PA pulsatility index of greater than 6 3.  LVEDP of 14 mmHg with preserved ventricular function by ventriculography with inferior apical hypokinesis 4.  VF arrest occurring during EMS transport; patient was treated with amiodarone bolus and infusion. Recommendation: Judicious IV fluids, continue IV amiodarone given VF arrest, dual platelet therapy for at least 6 months and preferably 1 year, echocardiogram, and monitoring in the 2 heart unit.  The results were reviewed with Dr. Alease Amend.  Result Date: 01/29/2024   Mid Cx lesion is 100% stenosed.   A stent was successfully placed.   Post intervention, there is a 0% residual stenosis. 1.  Occluded mid left circumflex lesion treated with 1 drug-eluting stent. 2.  Due to VF arrest, elevated lactic acid, and ongoing shock of unknown etiology; right heart catheterization was pursued:  Fick cardiac output of 6.4 L/min and Fick cardiac index of 3.5 L/min/m   Right atrial pressure mean of 2 mmHg   Right ventricular pressure 19/4 with an end-diastolic pressure of 5 mmHg   Wedge pressure mean of 13 mmHg   PA pressure 25/12 with a mean of 17 mmHg   PVR of less than 1 Wood unit   PA pulsatility index of greater than 6 3.  LVEDP of 14 mmHg with preserved ventricular function by ventriculography with inferior apical hypokinesis Recommendation: Judicious IV fluids, continue IV amiodarone given VF arrest, dual platelet therapy for at least 6 months and preferably 1 year, echocardiogram, and monitoring the 2 heart unit.  The results were reviewed with Dr. Alease Amend.   DG Chest 2 View Result Date: 01/20/2024 CLINICAL DATA:  58 year old male with pleuritic chest pain EXAM: CHEST - 2 VIEW COMPARISON:   06/19/2017 FINDINGS: Cardiomediastinal silhouette unchanged in size and contour. No evidence of central vascular congestion. No interlobular septal thickening. No pneumothorax or pleural effusion. Coarsened interstitial markings, with no confluent airspace disease. No acute displaced fracture. Degenerative changes of the spine. IMPRESSION: No active cardiopulmonary disease. Electronically Signed   By: Myrlene Asper D.O.   On: 01/20/2024 13:47    Disposition Pt is being discharged home today in good condition.  Follow-up Plans & Appointments  Discharge Instructions     AMB Referral to Cardiac Rehabilitation - Phase II   Complete by: As directed    Diagnosis: STEMI   After initial evaluation and assessments completed: Virtual Based Care may be provided alone or in conjunction with Phase 2 Cardiac Rehab based on patient barriers.: Yes   Intensive Cardiac Rehabilitation (ICR) [facility] location only OR Traditional Cardiac Rehabilitation (TCR) *If  criteria for ICR are not met will enroll in TCR [facility] only): Yes   Call MD for:  difficulty breathing, headache or visual disturbances   Complete by: As directed    Call MD for:  persistant dizziness or light-headedness   Complete by: As directed    Call MD for:  redness, tenderness, or signs of infection (pain, swelling, redness, odor or green/yellow discharge around incision site)   Complete by: As directed    Diet - low sodium heart healthy   Complete by: As directed    Discharge instructions   Complete by: As directed    Radial Site Care Refer to this sheet in the next few weeks. These instructions provide you with information on caring for yourself after your procedure. Your caregiver may also give you more specific instructions. Your treatment has been planned according to current medical practices, but problems sometimes occur. Call your caregiver if you have any problems or questions after your procedure. HOME CARE INSTRUCTIONS You may shower the  day after the procedure. Remove the bandage (dressing) and gently wash the site with plain soap and water. Gently pat the site dry.  Do not apply powder or lotion to the site.  Do not submerge the affected site in water for 3 to 5 days.  Inspect the site at least twice daily.  Do not flex or bend the affected arm for 24 hours.  No lifting over 5 pounds (2.3 kg) for 5 days after your procedure.  Do not drive home if you are discharged the same day of the procedure. Have someone else drive you.  You may drive 24 hours after the procedure unless otherwise instructed by your caregiver.  What to expect: Any bruising will usually fade within 1 to 2 weeks.  Blood that collects in the tissue (hematoma) may be painful to the touch. It should usually decrease in size and tenderness within 1 to 2 weeks.  SEEK IMMEDIATE MEDICAL CARE IF: You have unusual pain at the radial site.  You have redness, warmth, swelling, or pain at the radial site.  You have drainage (other than a small amount of blood on the dressing).  You have chills.  You have a fever or persistent symptoms for more than 72 hours.  You have a fever and your symptoms suddenly get worse.  Your arm becomes pale, cool, tingly, or numb.  You have heavy bleeding from the site. Hold pressure on the site.   PLEASE DO NOT MISS ANY DOSES OF YOUR EFFIENT!!!!! Also keep a log of you blood pressures and bring back to your follow up appt. Please call the office with any questions.   Patients taking blood thinners should generally stay away from medicines like ibuprofen, Advil, Motrin, naproxen , and Aleve  due to risk of stomach bleeding. You may take Tylenol  as directed or talk to your primary doctor about alternatives.   PLEASE ENSURE THAT YOU DO NOT RUN OUT OF YOUR EFFIENT. This medication is very important to remain on for at least one year. IF you have issues obtaining this medication due to cost please CALL the office 3-5 business days prior to  running out in order to prevent missing doses of this medication.   Increase activity slowly   Complete by: As directed        Discharge Medications Allergies as of 01/31/2024   No Known Allergies      Medication List     STOP taking these medications    ibuprofen  200 MG tablet Commonly known as: ADVIL       TAKE these medications    aspirin  81 MG chewable tablet Chew 1 tablet (81 mg total) by mouth daily. Start taking on: February 01, 2024   atorvastatin 80 MG tablet Commonly known as: LIPITOR Take 1 tablet (80 mg total) by mouth daily. Start taking on: February 01, 2024   famotidine 20 MG tablet Commonly known as: PEPCID Take 20 mg by mouth daily.   metoprolol succinate 25 MG 24 hr tablet Commonly known as: TOPROL-XL Take 1 tablet (25 mg total) by mouth daily. Start taking on: February 01, 2024   nitroGLYCERIN 0.4 MG SL tablet Commonly known as: NITROSTAT Place 1 tablet (0.4 mg total) under the tongue every 5 (five) minutes as needed for chest pain.   prasugrel 10 MG Tabs tablet Commonly known as: EFFIENT Take 1 tablet (10 mg total) by mouth daily. Start taking on: February 01, 2024   Primatene Mist 0.125 MG/ACT Aero Generic drug: EPINEPHrine Inhale 1 puff into the lungs as needed (SOB/Asthma).   sodium fluoride  1.1 % Gel dental gel Commonly known as: FLUORISHIELD Place 1 Application onto teeth at bedtime.         Outstanding Labs/Studies  N/a   Duration of Discharge Encounter: APP Time: 25 minutes   Signed, Johnie Nailer, NP 01/31/2024, 11:04 AM   ATTENDING ATTESTATION:  After conducting a review of all available clinical information with the care team, interviewing the patient, and performing a physical exam, I agree with the findings and plan described in this note.   GEN: No acute distress.   HEENT:  MMM, no JVD, no scleral icterus Cardiac: RRR, no murmurs, rubs, or gallops.  Respiratory: Clear to auscultation bilaterally. GI: Soft, nontender,  non-distended  MS: No edema; No deformity. Neuro:  Nonfocal  Vasc:  +2 radial pulses  Patient is doing well after treatment for inferior STEMI with PCI of left circumflex complicated by VF arrest.  Discharged today with close hospital follow-up and plan as detailed below.  #STEMI:  STEMI 6/7 PCI Lcx DES x 1             -EF 50-55% with posterolateral HK, no significant valve issues             -ASA 81, prasugrel 10 x 1 year             -Atorvastatin 80             -PRN NTG              #VF Arrest:  No recurrence, increase Toprol to 25mg  Qday.              #HL:  Goal LDL <55 -Atorvastain 80   #Dispo:  D/C today with close hospital follow up.   APP discharge time:25 MD discharge time:30  Alyssa Backbone, MD Pager [phone]

## 2024-02-01 NOTE — Telephone Encounter (Addendum)
 Patient contacted regarding discharge from [facility] on 01/31/24  Patient understands to follow up with provider [facility] on 02/08/24 at 2:45 pm at [facility].  Patient understands discharge instructions? Yes Patient understands medications and regiment? Yes Patient understands to bring all medications to this visit? Yes.   Spoke w patient and his wife.  He has medications and voices understanding of plan for taking them.  Reviewed radial cath site care.  He will remove dressing on wrist and neck today before showering.  Will monitor for s/s of hematoma or infection.      Reports low grade temp continuing from hospital admission. 99.3 last night.  None today.  Feels fine otherwise.  They said someone mentioned to them a question about the possibility of radiation induced CAD.  He's had 35 radiation treatments recently for left sided neck cancer.  Wants to know if that can be a contributing factor.  I adv I do no know of this but radiology oncology my have input as well as I will forward to Dr. Lorie Rook for his input as well.  They are fine to wait and discuss this at his post hospital follow up next week.

## 2024-02-03 ENCOUNTER — Ambulatory Visit

## 2024-02-03 ENCOUNTER — Ambulatory Visit: Admitting: Family

## 2024-02-03 VITALS — BP 118/78 | HR 79 | Temp 98.4°F | Ht 68.0 in | Wt 156.0 lb

## 2024-02-03 DIAGNOSIS — Z09 Encounter for follow-up examination after completed treatment for conditions other than malignant neoplasm: Secondary | ICD-10-CM | POA: Diagnosis not present

## 2024-02-03 DIAGNOSIS — R059 Cough, unspecified: Secondary | ICD-10-CM

## 2024-02-03 DIAGNOSIS — C760 Malignant neoplasm of head, face and neck: Secondary | ICD-10-CM | POA: Diagnosis not present

## 2024-02-03 DIAGNOSIS — R200 Anesthesia of skin: Secondary | ICD-10-CM | POA: Diagnosis not present

## 2024-02-03 DIAGNOSIS — L03114 Cellulitis of left upper limb: Secondary | ICD-10-CM | POA: Diagnosis not present

## 2024-02-03 MED ORDER — CEPHALEXIN 500 MG PO CAPS: 500.0000 mg | ORAL_CAPSULE | Freq: Three times a day (TID) | ORAL | 0 refills | Status: DC

## 2024-02-03 NOTE — Progress Notes (Signed)
 Acute Office Visit  Subjective:     Patient ID: Peter [name], male    DOB: [birth year], 58 y.o.   MRN: [id number]  Chief Complaint  Patient presents with  . hematoma     Patient states he recently had a heart attack on 6/7 and was discharged on Monday. States where the IV was he states the area is hard and at night his arm goes to sleep at night. He also started to have a cough after leaving the hospital     HPI Patient is in today for hospital follow-up.  Patient reports reporting to the emergency department on January 29, 2024 with complaints of chest pain and pain radiating up his neck was diagnosed with a STEMI.  Today, he reports having a tender, red, swollen area to the left antecubital area of his arm.  Has occasional numbness in that arm as well.  Denies any discoloration to his hand.  Also reports having cough and chest congestion since the cardiac cath.  Not coughing up any phlegm.  Denies any fever or chills.  Has a past medical history of neck cancer status post radiation treatment.  In general today he is feeling much better.  Review of Systems  Respiratory:  Positive for cough. Negative for shortness of breath and wheezing.   Cardiovascular:  Negative for chest pain and palpitations.  Skin:        Red, tender, swollen left arm.  Neurological:        Numbness to the left arm intermittently   Psychiatric/Behavioral: Negative.    All other systems reviewed and are negative.  Past Medical History:  Diagnosis Date  . COVID 04/04/2021   states mild sx  . Degenerative disc disease, lumbar    Dr. Leighton Punches  . Dyslipidemia   . GERD (gastroesophageal reflux disease)   . Helicobacter pylori antibody positive 02/21/2005   treated with antibiotics    Social History   Socioeconomic History  . Marital status: Married    Spouse name: Not on file  . Number of children: 2  . Years of education: Not on file  . Highest education level: Associate degree: occupational, Scientist, product/process development, or  vocational program  Occupational History  . Occupation: Catering manager: [employer]  Tobacco Use  . Smoking status: Never  . Smokeless tobacco: Never  Vaping Use  . Vaping status: Never Used  Substance and Sexual Activity  . Alcohol use: Not Currently    Comment: as a teenager  . Drug use: Never  . Sexual activity: Yes  Other Topics Concern  . Not on file  Social History Narrative   Married   Two sons born 1995 and 2000   Work - real estate   Never smoker no alcohol tobacco or drug use   Social Drivers of Corporate investment banker Strain: Low Risk  (02/03/2024)   Overall Financial Resource Strain (CARDIA)   . Difficulty of Paying Living Expenses: Not hard at all  Food Insecurity: No Food Insecurity (02/03/2024)   Hunger Vital Sign   . Worried About Programme researcher, broadcasting/film/video in the Last Year: Never true   . Ran Out of Food in the Last Year: Never true  Transportation Needs: No Transportation Needs (02/03/2024)   PRAPARE - Transportation   . Lack of Transportation (Medical): No   . Lack of Transportation (Non-Medical): No  Physical Activity: Inactive (02/03/2024)   Exercise Vital Sign   . Days of Exercise  per Week: 0 days   . Minutes of Exercise per Session: Not on file  Stress: No Stress Concern Present (02/03/2024)   [employer] of Occupational Health - Occupational Stress Questionnaire   . Feeling of Stress: Not at all  Social Connections: Socially Integrated (02/03/2024)   Social Connection and Isolation Panel   . Frequency of Communication with Friends and Family: More than three times a week   . Frequency of Social Gatherings with Friends and Family: More than three times a week   . Attends Religious Services: More than 4 times per year   . Active Member of Clubs or Organizations: Yes   . Attends Banker Meetings: More than 4 times per year   . Marital Status: Married  Catering manager Violence: Not At Risk (01/30/2024)   Humiliation,  Afraid, Rape, and Kick questionnaire   . Fear of Current or Ex-Partner: No   . Emotionally Abused: No   . Physically Abused: No   . Sexually Abused: No    Past Surgical History:  Procedure Laterality Date  . COLONOSCOPY  2013   normal  . CORONARY/GRAFT ACUTE MI REVASCULARIZATION N/A 01/29/2024   Procedure: Coronary/Graft Acute MI Revascularization;  Surgeon: Kyra Phy, MD;  Location: [facility] INVASIVE [facility];  Service: Cardiovascular;  Laterality: N/A;  . ESOPHAGOGASTRODUODENOSCOPY  2007   Dr. Elsie Halo  . LEFT HEART CATH AND CORONARY ANGIOGRAPHY N/A 01/29/2024   Procedure: LEFT HEART CATH AND CORONARY ANGIOGRAPHY;  Surgeon: Kyra Phy, MD;  Location: [facility] INVASIVE CV [facility];  Service: Cardiovascular;  Laterality: N/A;  . NASAL SINUS SURGERY  1986  . RIGHT HEART CATH N/A 01/29/2024   Procedure: RIGHT HEART CATH;  Surgeon: Kyra Phy, MD;  Location: [facility] INVASIVE CV LAB;  Service: Cardiovascular;  Laterality: N/A;    Family History  Problem Relation Age of Onset  . Colon polyps Mother   . Diabetes Father   . Heart disease Father 58  . Heart disease Brother 25       Died age 23  . Heart disease Brother   . Heart attack Brother 80       Alive 3 heart attackes at age 31  . Prostate cancer Brother   . Colon polyps Brother   . Colon polyps Brother   . Diabetes Brother   . Colon cancer Neg Hx   . Esophageal cancer Neg Hx   . Rectal cancer Neg Hx   . Stomach cancer Neg Hx     No Known Allergies  Current Outpatient Medications on File Prior to Visit  Medication Sig Dispense Refill  . aspirin  81 MG chewable tablet Chew 1 tablet (81 mg total) by mouth daily. 90 tablet 2  . atorvastatin  (LIPITOR ) 80 MG tablet Take 1 tablet (80 mg total) by mouth daily. 90 tablet 1  . EPINEPHrine (PRIMATENE MIST) 0.125 MG/ACT AERO Inhale 1 puff into the lungs as needed (SOB/Asthma).    . famotidine (PEPCID) 20 MG tablet Take 20 mg by mouth daily.    . metoprolol  succinate (TOPROL -XL) 25 MG 24 hr  tablet Take 1 tablet (25 mg total) by mouth daily. 90 tablet 1  . nitroGLYCERIN  (NITROSTAT ) 0.4 MG SL tablet Place 1 tablet (0.4 mg total) under the tongue every 5 (five) minutes as needed for chest pain. 25 tablet 2  . prasugrel  (EFFIENT ) 10 MG TABS tablet Take 1 tablet (10 mg total) by mouth daily. 90 tablet 2  . sodium fluoride  (FLUORISHIELD) 1.1 % GEL  dental gel Place 1 Application onto teeth at bedtime.     No current facility-administered medications on file prior to visit.    BP 118/78 (BP Location: Left Arm, Patient Position: Sitting, Cuff Size: Normal)   Pulse 79   Temp 98.4 F (36.9 C) (Oral)   Ht 5' 8 (1.727 m)   Wt 156 lb (70.8 kg)   SpO2 96%   BMI 23.72 kg/m chart     Objective:    BP 118/78 (BP Location: Left Arm, Patient Position: Sitting, Cuff Size: Normal)   Pulse 79   Temp 98.4 F (36.9 C) (Oral)   Ht 5' 8 (1.727 m)   Wt 156 lb (70.8 kg)   SpO2 96%   BMI 23.72 kg/m    Physical Exam Vitals and nursing note reviewed.  Constitutional:      Appearance: Normal appearance.   Eyes:     Extraocular Movements: Extraocular movements intact.     Conjunctiva/sclera: Conjunctivae normal.     Pupils: Pupils are equal, round, and reactive to light.    Cardiovascular:     Rate and Rhythm: Normal rate and regular rhythm.     Pulses: Normal pulses.     Heart sounds: Normal heart sounds.  Pulmonary:     Effort: Pulmonary effort is normal.     Breath sounds: Normal breath sounds.   Musculoskeletal:        General: Normal range of motion.       Arms:     Cervical back: Normal range of motion and neck supple.     Comments: Red, tender, mild swelling noted    Neurological:     General: No focal deficit present.     Mental Status: He is alert and oriented to person, place, and time. Mental status is at baseline.   Psychiatric:        Mood and Affect: Mood normal.        Behavior: Behavior normal.        Thought Content: Thought content normal.   No  results found for any visits on 02/03/24.      Assessment & Plan:   Problem List Items Addressed This Visit   None Visit Diagnoses       Hospital discharge follow-up    -  Primary     Left arm cellulitis         Left arm numbness       Relevant Orders   VAS US  UPPER EXTREMITY VENOUS DUPLEX     Cough in adult       Relevant Orders   DG Chest 2 View       Meds ordered this encounter  Medications  . cephALEXin  (KEFLEX ) 500 MG capsule    Sig: Take 1 capsule (500 mg total) by mouth 3 (three) times daily.    Dispense:  21 capsule    Refill:  0   Ultrasound ordered of the left antecubital area to just be sure that there is no vascular issue.  Will treat with cephalexin  to cover for cellulitis.  Chest x-ray to evaluate the cough.  Will notify patient pending results.  Call the office if symptoms worsen or persist.  Follow-up with PCP as scheduled and sooner as needed. No follow-ups on file.  Kimberlee Shoun B Stehanie Ekstrom, FNP

## 2024-02-04 ENCOUNTER — Ambulatory Visit: Admitting: Internal Medicine

## 2024-02-04 ENCOUNTER — Telehealth (HOSPITAL_COMMUNITY): Payer: Self-pay

## 2024-02-04 NOTE — Telephone Encounter (Signed)
 Pt insurance is active and benefits verified through BC[employer]. Co-pay $0.00, DED $8,050.00/$7,359.90 met, out of pocket $8,050.00/$7,359.90 met, co-insurance 30%. No pre-authorization required. Passport, 02/04/24 @ 3:50PM, REF#[id number]   How many CR sessions are covered? (36 visits for TCR, 72 visits for ICR)72 Is this a lifetime maximum or an annual maximum? Annual Has the member used any of these services to date? No Is there a time limit (weeks/months) on start of program and/or program completion? No     Will contact patient to see if he is interested in the Cardiac Rehab Program. If interested, patient will need to complete follow up appt. Once completed, patient will be contacted for scheduling upon review by the RN Navigator.

## 2024-02-04 NOTE — Telephone Encounter (Signed)
 Called and spoke with pt wife Lewie Rector, to see if pt is interested in the Cardiac Rehab Program. She stated pt is interest. Explained scheduling process and went over insurance, she verbalized understanding. Will contact patient for scheduling once f/u has been completed.

## 2024-02-06 ENCOUNTER — Ambulatory Visit: Payer: Self-pay | Admitting: Family

## 2024-02-07 ENCOUNTER — Telehealth: Payer: Self-pay | Admitting: Family

## 2024-02-07 ENCOUNTER — Encounter: Payer: Self-pay | Admitting: Internal Medicine

## 2024-02-07 ENCOUNTER — Telehealth: Payer: Self-pay | Admitting: Internal Medicine

## 2024-02-07 ENCOUNTER — Ambulatory Visit: Payer: Self-pay

## 2024-02-07 ENCOUNTER — Ambulatory Visit (HOSPITAL_COMMUNITY)
Admission: RE | Admit: 2024-02-07 | Discharge: 2024-02-07 | Disposition: A | Discharge status: Home / Self Care | Source: Ambulatory Visit | Attending: Surgery | Admitting: Surgery

## 2024-02-07 DIAGNOSIS — R200 Anesthesia of skin: Secondary | ICD-10-CM | POA: Diagnosis not present

## 2024-02-07 DIAGNOSIS — I809 Phlebitis and thrombophlebitis of unspecified site: Secondary | ICD-10-CM | POA: Insufficient documentation

## 2024-02-07 NOTE — Telephone Encounter (Signed)
 Spoke with patient today and he will keep appointment for tomorrow.

## 2024-02-07 NOTE — Telephone Encounter (Signed)
 No triage: Danielle from [facility] called to say no clot was found. She is sending over the preliminary report now. Please advise        Copied from CRM [id number]. Topic: Clinical - Lab/Test Results >> Feb 07, 2024 10:00 AM Allyne Areola wrote: Reason for CRM: Imaging was calling to provide results for ultrasound. Called [facility]. >> Feb 07, 2024 10:14 AM Corin V wrote: Diego Foy Calling again with preliminary report

## 2024-02-07 NOTE — Telephone Encounter (Signed)
 Copied from CRM [id number]. Topic: General - Other >> Feb 07, 2024 11:02 AM Dorisann Garre T wrote: Reason for CRM: patient has a appt for tomorrow but wanted to know if he still needed to come in and be seen or if dr burns can just order him to have a ultrasound of his right arm done he does have a blood clot in his left arm now the right arm is causing pain patient would like a call back regarding this

## 2024-02-07 NOTE — Telephone Encounter (Signed)
 Spoke with patient today and results given.  He will keep appointment for tomorrow.   Recommended if pain got worse in his arm to head to ED.

## 2024-02-07 NOTE — Progress Notes (Deleted)
    Subjective:    Patient ID: Peter [name], male    DOB: Jan 12, 1966, 58 y.o.   MRN: [id number]      HPI Dearies is here for No chief complaint on file.   Seen last week for hospital follow-up-admitted for STEMI.  Last week he had left antecubital tenderness, redness and swelling area where he had the IV.  Ultrasound revealed superficial blood clot.  He was started on Keflex  for cellulitis.  Right arm swollen and red -concern for blood clot.     Medications and allergies reviewed with patient and updated if appropriate.  Current Outpatient Medications on File Prior to Visit  Medication Sig Dispense Refill   aspirin  81 MG chewable tablet Chew 1 tablet (81 mg total) by mouth daily. 90 tablet 2   atorvastatin  (LIPITOR ) 80 MG tablet Take 1 tablet (80 mg total) by mouth daily. 90 tablet 1   cephALEXin  (KEFLEX ) 500 MG capsule Take 1 capsule (500 mg total) by mouth 3 (three) times daily. 21 capsule 0   EPINEPHrine (PRIMATENE MIST) 0.125 MG/ACT AERO Inhale 1 puff into the lungs as needed (SOB/Asthma).     famotidine (PEPCID) 20 MG tablet Take 20 mg by mouth daily.     metoprolol  succinate (TOPROL -XL) 25 MG 24 hr tablet Take 1 tablet (25 mg total) by mouth daily. 90 tablet 1   nitroGLYCERIN  (NITROSTAT ) 0.4 MG SL tablet Place 1 tablet (0.4 mg total) under the tongue every 5 (five) minutes as needed for chest pain. 25 tablet 2   prasugrel  (EFFIENT ) 10 MG TABS tablet Take 1 tablet (10 mg total) by mouth daily. 90 tablet 2   sodium fluoride  (FLUORISHIELD) 1.1 % GEL dental gel Place 1 Application onto teeth at bedtime.     No current facility-administered medications on file prior to visit.    Review of Systems     Objective:  There were no vitals filed for this visit. BP Readings from Last 3 Encounters:  02/03/24 118/78  01/31/24 101/76  01/13/24 120/78   Wt Readings from Last 3 Encounters:  02/03/24 156 lb (70.8 kg)  01/31/24 155 lb 9.6 oz (70.6 kg)  01/13/24 160 lb (72.6 kg)    There is no height or weight on file to calculate BMI.    Physical Exam         Assessment & Plan:    See Problem List for Assessment and Plan of chronic medical problems.

## 2024-02-08 ENCOUNTER — Ambulatory Visit: Attending: Emergency Medicine | Admitting: Emergency Medicine

## 2024-02-08 ENCOUNTER — Encounter: Payer: Self-pay | Admitting: Emergency Medicine

## 2024-02-08 ENCOUNTER — Ambulatory Visit: Admitting: Internal Medicine

## 2024-02-08 VITALS — BP 105/68 | HR 72 | Ht 68.0 in | Wt 159.8 lb

## 2024-02-08 DIAGNOSIS — I808 Phlebitis and thrombophlebitis of other sites: Secondary | ICD-10-CM

## 2024-02-08 DIAGNOSIS — M25531 Pain in right wrist: Secondary | ICD-10-CM | POA: Diagnosis not present

## 2024-02-08 DIAGNOSIS — M25431 Effusion, right wrist: Secondary | ICD-10-CM

## 2024-02-08 DIAGNOSIS — I4901 Ventricular fibrillation: Secondary | ICD-10-CM

## 2024-02-08 DIAGNOSIS — I2121 ST elevation (STEMI) myocardial infarction involving left circumflex coronary artery: Secondary | ICD-10-CM

## 2024-02-08 DIAGNOSIS — I469 Cardiac arrest, cause unspecified: Secondary | ICD-10-CM | POA: Diagnosis not present

## 2024-02-08 DIAGNOSIS — E782 Mixed hyperlipidemia: Secondary | ICD-10-CM

## 2024-02-08 DIAGNOSIS — I2510 Atherosclerotic heart disease of native coronary artery without angina pectoris: Secondary | ICD-10-CM

## 2024-02-08 NOTE — Patient Instructions (Addendum)
 Medication Instructions:  Your physician recommends that you continue on your current medications as directed. Please refer to the Current Medication list given to you today.  *If you need a refill on your cardiac medications before your next appointment, please call your pharmacy*  Lab Work: TODAY:  BMET & CBC  If you have labs (blood work) drawn today and your tests are completely normal, you will receive your results only by: MyChart Message (if you have MyChart) OR A paper copy in the mail If you have any lab test that is abnormal or we need to change your treatment, we will call you to review the results.  Testing/Procedures: Your physician has requested that you have a upper extremity arterial duplex ASSP. This test is an ultrasound of the arteries in the legs or arms. It looks at arterial blood flow in the legs and arms. Allow one hour for Lower and Upper Arterial scans. There are no restrictions or special instructions.  Please note: We ask at that you not bring children with you during ultrasound (echo/ vascular) testing. Due to room size and safety concerns, children are not allowed in the ultrasound rooms during exams. Our front office staff cannot provide observation of children in our lobby area while testing is being conducted. An adult accompanying a patient to their appointment will only be allowed in the ultrasound room at the discretion of the ultrasound technician under special circumstances. We apologize for any inconvenience.   Follow-Up: At [facility], you and your health needs are our priority.  As part of our continuing mission to provide you with exceptional heart care, our providers are all part of one team.  This team includes your primary Cardiologist (physician) and Advanced Practice Providers or APPs (Physician Assistants and Nurse Practitioners) who all work together to provide you with the care you need, when you need it.  Your next appointment:   3  month(s)  Provider:   Arun K Thukkani, MD    We recommend signing up for the patient portal called MyChart.  Sign up information is provided on this After Visit Summary.  MyChart is used to connect with patients for Virtual Visits (Telemedicine).  Patients are able to view lab/test results, encounter notes, upcoming appointments, etc.  Non-urgent messages can be sent to your provider as well.   To learn more about what you can do with MyChart, go to [email or web address].   Other Instructions

## 2024-02-08 NOTE — Progress Notes (Addendum)
 Cardiology Office Note:    Date:  02/09/2024  ID:  Peter [name], DOB 28-Jan-1966, MRN [id number] PCP: [name], MD  [facility] Providers Cardiologist:  Kyra Phy, MD       Patient Profile:       Chief Complaint: Follow-up cardiac catheterization History of Present Illness:  Peter [name] is a 58 y.o. male with visit-pertinent history of GERD, head and neck cancer s/p XRT, STEMI s/p PCI/DES to mid circumflex on 01/2024, ventricular fibrillation arrest, hyperlipidemia  On 01/29/2024 he developed acute chest pain with associated nausea and diaphoresis.  He called EMS and EKG demonstrated an inferolateral ST elevation MI.  En route in the ambulance he did suffer ventricular fibrillation arrest requiring cardioversion x 1.  Cardiac catheterization on 01/29/2024 showed mid circumflex lesion 100% stenosis treated with 1 drug-eluting stent.  Right heart cath with low filling pressures, normal cardiac index/cardiac output.  Echocardiogram 01/2024 showed LVEF 50 to 55%, posterior lateral hypokinesis, normal diastolic parameters, RV function size normal, aortic valve sclerosis.  Recommendations for DAPT with aspirin /Effient  for at least 1 year.  He was started on atorvastatin  and metoprolol  at discharge.   Discussed the use of AI scribe software for clinical note transcription with the patient, who gave verbal consent to proceed.  History of Present Illness Peter [name] is a 58 year old male with coronary artery disease who presents for follow-up after recent stent placement.   Today patient arrives to clinic with his wife.  He is without any acute cardiovascular concerns or complaints today.  He denies any anginal symptoms.  Denies any chest pains or dyspnea.  Has some chest wall soreness to palpitation likely due to CPR/cardioversion.  Heart palpitations occurred on the second night after returning home but have resolved.  He has returned to mild exercise.  He walks at home without  any exertional symptoms, his heart rate reaches about 100 bpm after 15 minutes of walking.  He is tolerating all of his new medications without difficulty.  Denies any lightheadedness, dizziness, PND, orthopnea, syncope, presyncope, melena, hematochezia.  He has noticed some right arm pain at cardiac catheterization site to right wrist.  Noted the pain which only started a couple days ago.    He maintains a healthy diet, avoiding fried foods.  He would like to join cardiac rehab.  Review of systems:  Please see the history of present illness. All other systems are reviewed and otherwise negative.      Studies Reviewed:    EKG Interpretation Date/Time:  Tuesday February 08 2024 15:33:00 EDT Ventricular Rate:  71 PR Interval:  180 QRS Duration:  98 QT Interval:  376 QTC Calculation: 408 R Axis:   86  Text Interpretation: Normal sinus rhythm Normal ECG When compared with ECG of 30-Jan-2024 07:26, No significant change was found Confirmed by Palmer Bobo [phone]) on 02/08/2024 5:11:01 PM    Cardiac catheterization 01/29/2024   Mid Cx lesion is 100% stenosed.   A stent was successfully placed.   Post intervention, there is a 0% residual stenosis.   1.  Occluded mid left circumflex lesion treated with 1 drug-eluting stent. 2.  Due to VF arrest, elevated lactic acid, and ongoing shock of unknown etiology; right heart catheterization was pursued:            Fick cardiac output of 6.4 L/min and Fick cardiac index of 3.5 L/min/m  Right atrial pressure mean of 2 mmHg                       Right ventricular pressure 19/4 with an end-diastolic pressure of 5 mmHg                       Wedge pressure mean of 13 mmHg                       PA pressure 25/12 with a mean of 17 mmHg                       PVR of less than 1 Wood unit                       PA pulsatility index of greater than 6 3.  LVEDP of 14 mmHg with preserved ventricular function by ventriculography with  inferior apical hypokinesis 4.  VF arrest occurring during EMS transport; patient was treated with amiodarone  bolus and infusion.  Diagnostic Dominance: Right  Intervention    Echocardiogram 01/30/2024  1. Posterior lateral hypokinesis . Left ventricular ejection fraction, by  estimation, is 50 to 55%. The left ventricle has low normal function. The  left ventricle demonstrates regional wall motion abnormalities (see  scoring diagram/findings for  description). Left ventricular diastolic parameters were normal.   2. Right ventricular systolic function is normal. The right ventricular  size is normal.   3. The mitral valve is normal in structure. No evidence of mitral valve  regurgitation. No evidence of mitral stenosis.   4. The aortic valve is tricuspid. There is mild calcification of the  aortic valve. There is mild thickening of the aortic valve. Aortic valve  regurgitation is not visualized. Aortic valve sclerosis is present, with  no evidence of aortic valve stenosis.   5. The inferior vena cava is normal in size with greater than 50%  respiratory variability, suggesting right atrial pressure of 3 mmHg.    Risk Assessment/Calculations:              Physical Exam:   VS:  BP 105/68   Pulse 72   Ht 5' 8 (1.727 m)   Wt 159 lb 12.8 oz (72.5 kg)   SpO2 98%   BMI 24.30 kg/m    Wt Readings from Last 3 Encounters:  02/08/24 159 lb 12.8 oz (72.5 kg)  02/03/24 156 lb (70.8 kg)  01/31/24 155 lb 9.6 oz (70.6 kg)    GEN: Well nourished, well developed in no acute distress NECK: No JVD; No carotid bruits CARDIAC: RRR, no murmurs, rubs, gallops RESPIRATORY:  Clear to auscultation without rales, wheezing or rhonchi  ABDOMEN: Soft, non-tender, non-distended EXTREMITIES:  No edema; No acute deformity      Assessment and Plan:  Coronary artery disease STEMI S/p PCI/DES x 1 to 100% occluded mid circumflex on 01/29/2024 Echocardiogram 01/30/2024 with LVEF 50 to 55%, posterior  lateral hypokinesis, RV normal, no significant valvular abnormalities WMA consistent with LCx infarct - Today patient is without any anginal symptoms.  Has resumed activity without exertional chest pains.  Has musculoskeletal/chest wall pain from compressions which has improved.  There is no indication further ischemic evaluation at this time - EKG today with NSR without acute ischemic changes - Tolerating new medications without difficulty - Right radial cath site without bruit.  Is noted to have swelling, mild pain,  and hematoma.  No numbness/tingling/pallor.  Good capillary refill.  Will plan for upper extremity arterial duplex - Continue DAPT with aspirin  81 mg and prasugrel  10 mg daily x 1 year - Continue atorvastatin  80 mg daily, metoprolol  XL 25 mg daily, nitroglycerin  as needed - Cleared for cardiac rehab - BMET and CBC today  VF arrest While on route with EMS, cardioverted x 1 and loaded with IV amiodarone  admission. RHC on 6/7 with low filling pressures, normal cardiac index/cardiac output - Denies syncope, presyncope, palpitations, chest pains, dyspnea.  Remains asymptomatic - EKG today NSR without acute ischemic changes - Continue clinically monitor  Hyperlipidemia, LDL goal <55 Lipoprotein (A) 12.3 on 01/2024 LDL 39, HDL 30, TG 213 on 0/9604 LDL well-controlled.  HDL low will need to increase moderate intensity aerobic exercise - Continue atorvastatin  80 mg daily - Recommend DASH diet (high in vegetables, fruits, low-fat dairy products, whole grains, poultry, fish, and nuts and low in sweets, sugar-sweetened beverages, and red meats), salt restriction and increase physical activity.      Cardiac Rehabilitation Eligibility Assessment  The patient is ready to start cardiac rehabilitation from a cardiac standpoint.     Dispo:  Return in about 3 months (around 05/10/2024).  Signed, Ava Boatman, NP

## 2024-02-09 ENCOUNTER — Ambulatory Visit: Payer: Self-pay | Admitting: Emergency Medicine

## 2024-02-09 LAB — CBC
Hematocrit: 41.6 % (ref 37.5–51.0)
Hemoglobin: 13.7 g/dL (ref 13.0–17.7)
MCH: 31.7 pg (ref 26.6–33.0)
MCHC: 32.9 g/dL (ref 31.5–35.7)
MCV: 96 fL (ref 79–97)
Platelets: 301 10*3/uL (ref 150–450)
RBC: 4.32 x10E6/uL (ref 4.14–5.80)
RDW: 12.6 % (ref 11.6–15.4)
WBC: 7.3 10*3/uL (ref 3.4–10.8)

## 2024-02-09 LAB — BASIC METABOLIC PANEL WITH GFR
BUN/Creatinine Ratio: 19 (ref 9–20)
BUN: 16 mg/dL (ref 6–24)
CO2: 26 mmol/L (ref 20–29)
Calcium: 9.2 mg/dL (ref 8.7–10.2)
Chloride: 98 mmol/L (ref 96–106)
Creatinine, Ser: 0.85 mg/dL (ref 0.76–1.27)
Glucose: 101 mg/dL — ABNORMAL HIGH (ref 70–99)
Potassium: 4.4 mmol/L (ref 3.5–5.2)
Sodium: 138 mmol/L (ref 134–144)
eGFR: 101 mL/min/{1.73_m2} (ref >59)

## 2024-02-10 ENCOUNTER — Telehealth (HOSPITAL_COMMUNITY): Payer: Self-pay

## 2024-02-10 NOTE — Telephone Encounter (Signed)
 Attempted to call patient in regards to scheduling cardiac rehab- no answer, left message to call us  back. Sent MyChart message.

## 2024-02-15 ENCOUNTER — Encounter: Payer: Self-pay | Admitting: Internal Medicine

## 2024-02-15 DIAGNOSIS — I2510 Atherosclerotic heart disease of native coronary artery without angina pectoris: Secondary | ICD-10-CM | POA: Insufficient documentation

## 2024-02-15 NOTE — Progress Notes (Unsigned)
 Subjective:    Patient ID: Peter [name], male    DOB: 1966/07/05, 58 y.o.   MRN: [id number]     HPI Peter [name] is here for follow up from the hospital   Admitted 6/7 - 6/9 for STEMI of left circumflex    He did have some scapular pain a few days prior.  It was variable intensity and not associated with exertion.  On 6/7 he developed acute onset chest pain around 9:30 in the morning.  He had associated nausea and diaphoresis.  EMS was called send EKG demonstrated inferior lateral ST elevation MI.  En route to the hospital he suffered a VF arrest requiring cardioversion x 1.  In the ED he was hemodynamically stable.  He was loaded with IV amiodarone  300 mg and amiodarone  drip started.  He had already received aspirin .  He received 4000 unit heparin  bolus.  He continued to have chest pain and inferior ST elevation MI he was referred to emergent coronary angiography.  STEMI-inferior lateral: Underwent cardiac catheterization-had occluded mid circumflex lesion treated with PCI/DES x 1 Started on DAPT with aspirin , Effient  for at least 1 year No recurrent chest pain Able to ambulate with cardiac rehab Echocardiogram with EF 50-55%, posterior lateral hypokinesis, normal RV, no significant valvular disease Continue aspirin , Effient , atorvastatin  80 mg daily  VF arrest: While in the room with EMS to the ED Cardioverted x 1 Loaded with IV amiodarone  Right heart cath with low filling pressures, normal cardiac index/cardiac output with lactic acid cleared  HLD: LDL 39, HDL 30 Continue atorvastatin  80 mg daily  Medications and allergies reviewed with patient and updated if appropriate.  Current Outpatient Medications on File Prior to Visit  Medication Sig Dispense Refill   aspirin  81 MG chewable tablet Chew 1 tablet (81 mg total) by mouth daily. 90 tablet 2   atorvastatin  (LIPITOR ) 80 MG tablet Take 1 tablet (80 mg total) by mouth daily. 90 tablet 1   cephALEXin  (KEFLEX ) 500 MG  capsule Take 1 capsule (500 mg total) by mouth 3 (three) times daily. 21 capsule 0   EPINEPHrine (PRIMATENE MIST) 0.125 MG/ACT AERO Inhale 1 puff into the lungs as needed (SOB/Asthma).     famotidine (PEPCID) 20 MG tablet Take 20 mg by mouth daily.     metoprolol  succinate (TOPROL -XL) 25 MG 24 hr tablet Take 1 tablet (25 mg total) by mouth daily. 90 tablet 1   nitroGLYCERIN  (NITROSTAT ) 0.4 MG SL tablet Place 1 tablet (0.4 mg total) under the tongue every 5 (five) minutes as needed for chest pain. 25 tablet 2   prasugrel  (EFFIENT ) 10 MG TABS tablet Take 1 tablet (10 mg total) by mouth daily. 90 tablet 2   sodium fluoride  (FLUORISHIELD) 1.1 % GEL dental gel Place 1 Application onto teeth at bedtime.     No current facility-administered medications on file prior to visit.     Review of Systems     Objective:  There were no vitals filed for this visit. BP Readings from Last 3 Encounters:  02/08/24 105/68  02/03/24 118/78  01/31/24 101/76   Wt Readings from Last 3 Encounters:  02/08/24 159 lb 12.8 oz (72.5 kg)  02/03/24 156 lb (70.8 kg)  01/31/24 155 lb 9.6 oz (70.6 kg)   There is no height or weight on file to calculate BMI.    Physical Exam     Lab Results  Component Value Date   WBC 7.3 02/08/2024   HGB 13.7 02/08/2024  HCT 41.6 02/08/2024   PLT 301 02/08/2024   GLUCOSE 101 (H) 02/08/2024   CHOL 112 01/29/2024   TRIG 213 (H) 01/29/2024   HDL 30 (L) 01/29/2024   LDLCALC 39 01/29/2024   ALT 27 01/29/2024   AST 34 01/29/2024   NA 138 02/08/2024   K 4.4 02/08/2024   CL 98 02/08/2024   CREATININE 0.85 02/08/2024   BUN 16 02/08/2024   CO2 26 02/08/2024   TSH 2.87 03/25/2023   INR 1.0 01/29/2024   HGBA1C 6.0 (H) 01/29/2024   VAS US  UPPER EXTREMITY VENOUS DUPLEX UPPER VENOUS STUDY    Patient Name:  Peter [name]  Date of Exam:   02/07/2024 Medical Rec #: [id number]        Accession #:    [id number] Date of Birth: 1966-06-28         Patient Gender: M Patient Age:    59 years Exam Location:  [address] Procedure:      VAS US  UPPER EXTREMITY VENOUS DUPLEX Referring Phys: PADONDA WEBB  --------------------------------------------------------------------------------   Other Indications: Tender, red swollen area to the left antecubital portion of his arm; numbness to the left arm intermittently. Performing Technologist: Edsel Mustard RVT    Examination Guidelines: A complete evaluation includes B-mode imaging, spectral Doppler, color Doppler, and power Doppler as needed of all accessible portions of each vessel. Bilateral testing is considered an integral part of a complete examination. Limited examinations for reoccurring indications may be performed as noted.    Right Findings: +----------+------------+---------+-----------+----------+-------+ RIGHT     CompressiblePhasicitySpontaneousPropertiesSummary +----------+------------+---------+-----------+----------+-------+ Subclavian               Yes       Yes                      +----------+------------+---------+-----------+----------+-------+    Left Findings: +----------+------------+---------+-----------+----------------+-------+ LEFT      CompressiblePhasicitySpontaneous   Properties   Summary +----------+------------+---------+-----------+----------------+-------+ IJV           Full       Yes       Yes                            +----------+------------+---------+-----------+----------------+-------+ Subclavian               Yes       Yes                            +----------+------------+---------+-----------+----------------+-------+ Axillary      Full       Yes       Yes                            +----------+------------+---------+-----------+----------------+-------+ Brachial      Full       Yes       Yes                            +----------+------------+---------+-----------+----------------+-------+ Radial        Full                                                 +----------+------------+---------+-----------+----------------+-------+ Ulnar  Full                                                +----------+------------+---------+-----------+----------------+-------+ Cephalic      None       No        No     softly echogenic Acute  +----------+------------+---------+-----------+----------------+-------+ Basilic       Full       Yes       Yes                            +----------+------------+---------+-----------+----------------+-------+ Innominate               Yes       Yes                            +----------+------------+---------+-----------+----------------+-------+  Superficial thrombophlebitis noted in the left cepalic vein from the antecubital fossa to the distal upper arm. Findings reported to nurse Heather at 10:20 am.   Summary:   Right: No evidence of thrombosis in the subclavian.   Left: No evidence of deep vein thrombosis in the upper extremity. Findings consistent with acute superficial vein thrombosis involving the left cephalic vein.    *See table(s) above for measurements and observations.   Diagnosing physician: Gaile New MD Electronically signed by Gaile New MD on 02/07/2024 at 11:51:41 AM.      Final      Assessment & Plan:    See Problem List for Assessment and Plan of chronic medical problems.

## 2024-02-15 NOTE — Assessment & Plan Note (Signed)
 Chronic S/p STEMI s/p PCI. DES left circumstance Following with cardiology On aspirin  81 mg daily, atorvastatin  80 mg daily, metoprolol  XL 25 mg daily, Effient  10 mg daily, metoprolol  50 mg twice daily-doing well with the medications-denies any side effects

## 2024-02-16 ENCOUNTER — Ambulatory Visit (INDEPENDENT_AMBULATORY_CARE_PROVIDER_SITE_OTHER): Admitting: Internal Medicine

## 2024-02-16 VITALS — BP 110/80 | HR 78 | Temp 98.0°F | Ht 68.0 in | Wt 159.0 lb

## 2024-02-16 DIAGNOSIS — E785 Hyperlipidemia, unspecified: Secondary | ICD-10-CM | POA: Diagnosis not present

## 2024-02-16 DIAGNOSIS — I2510 Atherosclerotic heart disease of native coronary artery without angina pectoris: Secondary | ICD-10-CM

## 2024-02-16 DIAGNOSIS — Z9861 Coronary angioplasty status: Secondary | ICD-10-CM

## 2024-02-16 DIAGNOSIS — I2121 ST elevation (STEMI) myocardial infarction involving left circumflex coronary artery: Secondary | ICD-10-CM

## 2024-02-16 DIAGNOSIS — I10 Essential (primary) hypertension: Secondary | ICD-10-CM

## 2024-02-16 NOTE — Assessment & Plan Note (Signed)
 Chronic Continue atorvastatin  80 mg daily Encouraged regular exercise

## 2024-02-16 NOTE — Patient Instructions (Addendum)
        Medications changes include :   None

## 2024-02-16 NOTE — Assessment & Plan Note (Signed)
 S/p PCI/DES x 1 to 100% occluded mid circumflex on 01/29/2024

## 2024-02-17 ENCOUNTER — Telehealth: Payer: Self-pay | Admitting: Emergency Medicine

## 2024-02-17 NOTE — Telephone Encounter (Signed)
 [name] for pt to call back Discussed with Peter Beauvais NP pt may proceed with cleaning if NOT stopping duel antiplatelet therapy. Per Peter  would prefer for pt to  wait on cleaning for 6 months if possible ./cy

## 2024-02-17 NOTE — Telephone Encounter (Signed)
   Pre-operative Risk Assessment    Patient Name: Peter [name]  DOB: 1965/10/23 MRN: [id number]   Date of last office visit: 02/08/24 Date of next office visit: 05/16/24    Request for Surgical Clearance    Procedure:  Dental Cleaning   Date of Surgery:  Clearance 02/21/24                                Surgeon:  Leita L. Luster, D.D.S Surgeon's Group or Practice Name:  [facility]  Phone number:  [phone]  Fax number:  [phone]    Type of Clearance Requested:   - Medical    Type of Anesthesia:  None    Additional requests/questions:  Does this patient need antibiotics?  Bonney Sheffield JONELLE Lenora   02/17/2024, 3:35 PM

## 2024-02-17 NOTE — Telephone Encounter (Signed)
    Primary Cardiologist: Arun K Thukkani, MD  Chart reviewed as part of pre-operative protocol coverage. Simple dental extractions and cleanings are considered low risk procedures per guidelines and generally do not require any specific cardiac clearance. It is also generally accepted that for simple extractions and dental cleanings, there is no need to interrupt blood thinner therapy.  Given recent stent placement patient cannot hold Effient  or aspirin  at this time.  On chart review it has already been discussed with Josefa Beauvais, NP, Discussed with Josefa Beauvais NP pt may proceed with cleaning if NOT stopping duel antiplatelet therapy. Per Josefa would prefer for pt to wait on cleaning for 6 months if possible.   SBE prophylaxis is not required for the patient.  I will route this recommendation to the requesting party via Epic fax function and remove from pre-op pool.  Please call with questions.  Kary Colaizzi D Stefen Juba, NP 02/17/2024, 3:45 PM

## 2024-02-21 NOTE — Telephone Encounter (Signed)
 Spoke with patient, describes a small soft swollen area about the size of a quarter on the front of his lower leg shin area. Patient states he doesn't remember hitting it on anything but always possible. Denies any bruising, excessive swelling, chest pain, and/or shortness of breath. Consulted with [facility] in clinic, no new order. Patient thankful for the quick response. No further needs

## 2024-02-22 ENCOUNTER — Ambulatory Visit (HOSPITAL_COMMUNITY)
Admission: RE | Admit: 2024-02-22 | Discharge: 2024-02-22 | Disposition: A | Discharge status: Home / Self Care | Source: Ambulatory Visit | Attending: Emergency Medicine | Admitting: Emergency Medicine

## 2024-02-22 DIAGNOSIS — M25531 Pain in right wrist: Secondary | ICD-10-CM | POA: Diagnosis not present

## 2024-02-22 DIAGNOSIS — M25431 Effusion, right wrist: Secondary | ICD-10-CM | POA: Diagnosis not present

## 2024-02-23 NOTE — Telephone Encounter (Signed)
  [facility] calling back, they requesting more clarification with the clearance. If the pt need to wait 6 months or the pt can schedule teeth cleaning

## 2024-02-23 NOTE — Telephone Encounter (Signed)
 I s/w the dental office and explained the notes written: On chart review it has already been discussed with Josefa Beauvais, NP, Discussed with Josefa Beauvais NP pt may proceed with cleaning if NOT stopping duel antiplatelet therapy. Per Josefa would prefer for pt to wait on cleaning for 6 months if possible.     I explained the preference would be to wait 6 months; however for regular dental cleaning can be done as long as DAPT is NOT being stopped. Dental office gave verbal understanding with read back to me today and thanked me for the help.

## 2024-02-24 ENCOUNTER — Encounter (HOSPITAL_COMMUNITY)
Admission: RE | Admit: 2024-02-24 | Discharge: 2024-02-24 | Disposition: A | Discharge status: Home / Self Care | Source: Ambulatory Visit | Attending: Internal Medicine | Admitting: Internal Medicine

## 2024-02-24 VITALS — BP 106/64 | HR 98 | Ht 68.0 in | Wt 159.8 lb

## 2024-02-24 DIAGNOSIS — I252 Old myocardial infarction: Secondary | ICD-10-CM | POA: Insufficient documentation

## 2024-02-24 DIAGNOSIS — I213 ST elevation (STEMI) myocardial infarction of unspecified site: Secondary | ICD-10-CM

## 2024-02-24 DIAGNOSIS — Z48812 Encounter for surgical aftercare following surgery on the circulatory system: Secondary | ICD-10-CM | POA: Diagnosis not present

## 2024-02-24 DIAGNOSIS — Z955 Presence of coronary angioplasty implant and graft: Secondary | ICD-10-CM | POA: Diagnosis not present

## 2024-02-24 NOTE — Progress Notes (Signed)
 Cardiac Rehab Medication Review   Does the patient  feel that his/her medications are working for him/her?  yes  Has the patient been experiencing any side effects to the medications prescribed?  no  Does the patient measure his/her own blood pressure or blood glucose at home?  Unsure  Does the patient have any problems obtaining medications due to transportation or finances?   no  Understanding of regimen: excellent Understanding of indications: excellent Potential of compliance: excellent    Comments: Patient has a good understanding of his medication routine and is compliant with all his medications.     Peter [name] S Peter [name] 02/24/2024 3:55 PM

## 2024-02-24 NOTE — Progress Notes (Signed)
 Cardiac Individual Treatment Plan  Patient Details  Name: Peter [name] MRN: [id number] Date of Birth: [birth year] Referring Provider:   Flowsheet Row INTENSIVE CARDIAC REHAB ORIENT from 02/24/2024 in [facility]  Referring Provider Dr. Lurena Red, MD    Initial Encounter Date:  Flowsheet Row INTENSIVE CARDIAC REHAB ORIENT from 02/24/2024 in [facility]  Date 02/24/24    Visit Diagnosis: Status post coronary artery stent placement  ST elevation myocardial infarction (STEMI), unspecified artery (HCC)  Patient's Home Medications on Admission:  Current Outpatient Medications:    aspirin  81 MG chewable tablet, Chew 1 tablet (81 mg total) by mouth daily., Disp: 90 tablet, Rfl: 2   atorvastatin  (LIPITOR ) 80 MG tablet, Take 1 tablet (80 mg total) by mouth daily., Disp: 90 tablet, Rfl: 1   EPINEPHrine (PRIMATENE MIST) 0.125 MG/ACT AERO, Inhale 1 puff into the lungs as needed (SOB/Asthma)., Disp: , Rfl:    famotidine (PEPCID) 20 MG tablet, Take 20 mg by mouth daily., Disp: , Rfl:    metoprolol  succinate (TOPROL -XL) 25 MG 24 hr tablet, Take 1 tablet (25 mg total) by mouth daily., Disp: 90 tablet, Rfl: 1   nitroGLYCERIN  (NITROSTAT ) 0.4 MG SL tablet, Place 1 tablet (0.4 mg total) under the tongue every 5 (five) minutes as needed for chest pain., Disp: 25 tablet, Rfl: 2   prasugrel  (EFFIENT ) 10 MG TABS tablet, Take 1 tablet (10 mg total) by mouth daily., Disp: 90 tablet, Rfl: 2   sodium fluoride  (FLUORISHIELD) 1.1 % GEL dental gel, Place 1 Application onto teeth at bedtime., Disp: , Rfl:   Past Medical History: Past Medical History:  Diagnosis Date   COVID 04/04/2021   states mild sx   Degenerative disc disease, lumbar    Dr. Duwayne   Dyslipidemia    GERD (gastroesophageal reflux disease)    Helicobacter pylori antibody positive 02/21/2005   treated with antibiotics     Tobacco Use: Social History   Tobacco Use  Smoking Status Never  Smokeless Tobacco Never    Labs: Review Flowsheet  More data may exist      Latest Ref Rng & Units 07/23/2011 02/27/2016 05/25/2019 03/25/2023 01/29/2024  Labs for ITP Cardiac and Pulmonary Rehab  Cholestrol 0 - 200 mg/dL - 873  887  860  887   LDL (calc) 0 - 99 mg/dL - 58  48  69  39   HDL-C >40 mg/dL - 41  33  61.69  30   Trlycerides <150 mg/dL - 865  812  843.9  786   Hemoglobin A1c 4.8 - 5.6 % - 5.6  - 6.0  6.0   PH, Arterial 7.35 - 7.45 - - - - 7.322  7.393   PCO2 arterial 32 - 48 mmHg - - - - 52.6  29.5   Bicarbonate 20.0 - 28.0 mmol/L - - - - 28.3  27.9  27.3  18.0   TCO2 22 - 32 mmol/L 23  - - - 30  30  29  19    Acid-base deficit 0.0 - 2.0 mmol/L - - - - 6.0   O2 Saturation % - - - - 73  76  95  96     Details       Multiple values from one day are sorted in reverse-chronological order         Capillary Blood Glucose: Lab Results  Component Value Date  GLUCAP 166 (H) 01/29/2024   GLUCAP 182 (H) 01/29/2024   GLUCAP 71 09/30/2023   GLUCAP 63 (L) 09/30/2023     Exercise Target Goals: Exercise Program Goal: Individual exercise prescription set using results from initial 6 min walk test and THRR while considering  patient's activity barriers and safety.   Exercise Prescription Goal: Initial exercise prescription builds to 30-45 minutes a day of aerobic activity, 2-3 days per week.  Home exercise guidelines will be given to patient during program as part of exercise prescription that the participant will acknowledge.  Activity Barriers & Risk Stratification:  Activity Barriers & Cardiac Risk Stratification - 02/24/24 1522       Activity Barriers & Cardiac Risk Stratification   Activity Barriers None    Cardiac Risk Stratification High   Under 5 MET's         6 Minute Walk:  6 Minute Walk     Row Name 02/24/24 1513         6 Minute Walk   Phase Initial     Distance 1900 feet      Walk Time 6 minutes     # of Rest Breaks 0     MPH 3.6     METS 3.6     RPE 11     Perceived Dyspnea  0     VO2 Peak 16.62     Symptoms No     Resting HR 72 bpm     Resting BP 106/64     Resting Oxygen Saturation  96 %     Exercise Oxygen Saturation  during 6 min walk 97 %     Max Ex. HR 98 bpm     Max Ex. BP 118/60     2 Minute Post BP 114/68        Oxygen Initial Assessment:   Oxygen Re-Evaluation:   Oxygen Discharge (Final Oxygen Re-Evaluation):   Initial Exercise Prescription:  Initial Exercise Prescription - 02/24/24 1500       Date of Initial Exercise RX and Referring Provider   Date 02/24/24    Referring Provider Dr. Lurena Red, MD    Expected Discharge Date 05/17/24      Treadmill   MPH 3.8    Grade 0    Minutes 15    METs 3.91      Recumbant Elliptical   Level 2    RPM 45    Watts 90    Minutes 15    METs 3.5      Prescription Details   Frequency (times per week) 3    Duration Progress to 30 minutes of continuous aerobic without signs/symptoms of physical distress      Intensity   THRR 40-80% of Max Heartrate 65-131    Ratings of Perceived Exertion 11-13    Perceived Dyspnea 0-4      Progression   Progression Continue progressive overload as per policy without signs/symptoms or physical distress.      Resistance Training   Training Prescription Yes    Weight 3    Reps 10-15          Perform Capillary Blood Glucose checks as needed.  Exercise Prescription Changes:   Exercise Comments:   Exercise Goals and Review:   Exercise Goals     Row Name 02/24/24 1529             Exercise Goals   Increase Physical Activity Yes       Intervention Provide  advice, education, support and counseling about physical activity/exercise needs.;Develop an individualized exercise prescription for aerobic and resistive training based on initial evaluation findings, risk stratification, comorbidities and participant's personal goals.        Expected Outcomes Long Term: Exercising regularly at least 3-5 days a week.;Short Term: Attend rehab on a regular basis to increase amount of physical activity.;Long Term: Add in home exercise to make exercise part of routine and to increase amount of physical activity.       Increase Strength and Stamina Yes       Intervention Provide advice, education, support and counseling about physical activity/exercise needs.;Develop an individualized exercise prescription for aerobic and resistive training based on initial evaluation findings, risk stratification, comorbidities and participant's personal goals.       Expected Outcomes Short Term: Increase workloads from initial exercise prescription for resistance, speed, and METs.;Short Term: Perform resistance training exercises routinely during rehab and add in resistance training at home;Long Term: Improve cardiorespiratory fitness, muscular endurance and strength as measured by increased METs and functional capacity ( )       Able to understand and use rate of perceived exertion (RPE) scale Yes       Intervention Provide education and explanation on how to use RPE scale       Expected Outcomes Short Term: Able to use RPE daily in rehab to express subjective intensity level;Long Term:  Able to use RPE to guide intensity level when exercising independently       Knowledge and understanding of Target Heart Rate Range (THRR) Yes       Intervention Provide education and explanation of THRR including how the numbers were predicted and where they are located for reference       Expected Outcomes Short Term: Able to state/look up THRR;Long Term: Able to use THRR to govern intensity when exercising independently;Short Term: Able to use daily as guideline for intensity in rehab       Understanding of Exercise Prescription Yes       Intervention Provide education, explanation, and written materials on patient's individual exercise prescription       Expected  Outcomes Short Term: Able to explain program exercise prescription;Long Term: Able to explain home exercise prescription to exercise independently          Exercise Goals Re-Evaluation :   Discharge Exercise Prescription (Final Exercise Prescription Changes):   Nutrition:  Target Goals: Understanding of nutrition guidelines, daily intake of sodium 1500mg , cholesterol 200mg , calories 30% from fat and 7% or less from saturated fats, daily to have 5 or more servings of fruits and vegetables.  Biometrics:   Post Biometrics - 02/24/24 1512        Post  Biometrics   Waist Circumference 34.75 inches    Hip Circumference 36.5 inches    Waist to Hip Ratio 0.95 %    Triceps Skinfold 11 mm    % Body Fat 16.5 %    Grip Strength 33 kg    Flexibility --   Unable to reach   Single Leg Stand 30 seconds          Nutrition Therapy Plan and Nutrition Goals:   Nutrition Assessments:  MEDIFICTS Score Key: >=70 Need to make dietary changes  40-70 Heart Healthy Diet <= 40 Therapeutic Level Cholesterol Diet    Picture Your Plate Scores: <59 Unhealthy dietary pattern with much room for improvement. 41-50 Dietary pattern unlikely to meet recommendations for good health and room for improvement. 51-60 More  healthful dietary pattern, with some room for improvement.  >60 Healthy dietary pattern, although there may be some specific behaviors that could be improved.    Nutrition Goals Re-Evaluation:   Nutrition Goals Re-Evaluation:   Nutrition Goals Discharge (Final Nutrition Goals Re-Evaluation):   Psychosocial: Target Goals: Acknowledge presence or absence of significant depression and/or stress, maximize coping skills, provide positive support system. Participant is able to verbalize types and ability to use techniques and skills needed for reducing stress and depression.  Initial Review & Psychosocial Screening:  Initial Psych Review & Screening - 02/24/24 1534       Initial  Review   Current issues with None Identified      Family Dynamics   Good Support System? Yes   Has good support from family, no needs at this time     Barriers   Psychosocial barriers to participate in program There are no identifiable barriers or psychosocial needs.      Screening Interventions   Interventions Encouraged to exercise;To provide support and resources with identified psychosocial needs;Provide feedback about the scores to participant    Expected Outcomes Long Term Goal: Stressors or current issues are controlled or eliminated.;Short Term goal: Identification and review with participant of any Quality of Life or Depression concerns found by scoring the questionnaire.;Long Term goal: The participant improves quality of Life and PHQ9 Scores as seen by post scores and/or verbalization of changes          Quality of Life Scores:  Quality of Life - 02/24/24 1535       Quality of Life   Select Quality of Life      Quality of Life Scores   Health/Function Pre 29.2 %    Socioeconomic Pre 30 %    Psych/Spiritual Pre 30 %    Family Pre 30 %    GLOBAL Pre 29.65 %         Scores of 19 and below usually indicate a poorer quality of life in these areas.  A difference of  2-3 points is a clinically meaningful difference.  A difference of 2-3 points in the total score of the Quality of Life Index has been associated with significant improvement in overall quality of life, self-image, physical symptoms, and general health in studies assessing change in quality of life.  PHQ-9: Review Flowsheet  More data exists      02/24/2024 02/16/2024 02/03/2024 01/13/2024 04/15/2023  Depression screen PHQ 2/9  Decreased Interest 0 0 0 0 0  Down, Depressed, Hopeless 0 0 0 0 0  PHQ - 2 Score 0 0 0 0 0  Altered sleeping 0 0 - - -  Tired, decreased energy 0 0 - - -  Change in appetite 0 0 - - -  Feeling bad or failure about yourself  0 0 - - -  Trouble concentrating 0 0 - - -  Moving slowly  or fidgety/restless 0 0 - - -  Suicidal thoughts 0 0 - - -  PHQ-9 Score 0 0 - - -  Difficult doing work/chores Not difficult at all Not difficult at all - - -   Interpretation of Total Score  Total Score Depression Severity:  1-4 = Minimal depression, 5-9 = Mild depression, 10-14 = Moderate depression, 15-19 = Moderately severe depression, 20-27 = Severe depression   Psychosocial Evaluation and Intervention:   Psychosocial Re-Evaluation:   Psychosocial Discharge (Final Psychosocial Re-Evaluation):   Vocational Rehabilitation: Provide vocational rehab assistance to qualifying candidates.  Vocational Rehab Evaluation & Intervention:  Vocational Rehab - 02/24/24 1545       Initial Vocational Rehab Evaluation & Intervention   Assessment shows need for Vocational Rehabilitation No   No needs         Education: Education Goals: Education classes will be provided on a weekly basis, covering required topics. Participant will state understanding/return demonstration of topics presented.     Core Videos: Exercise    Move It!  Clinical staff conducted group or individual video education with verbal and written material and guidebook.  Patient learns the recommended Pritikin exercise program. Exercise with the goal of living a long, healthy life. Some of the health benefits of exercise include controlled diabetes, healthier blood pressure levels, improved cholesterol levels, improved heart and lung capacity, improved sleep, and better body composition. Everyone should speak with their doctor before starting or changing an exercise routine.  Biomechanical Limitations Clinical staff conducted group or individual video education with verbal and written material and guidebook.  Patient learns how biomechanical limitations can impact exercise and how we can mitigate and possibly overcome limitations to have an impactful and balanced exercise routine.  Body Composition Clinical staff  conducted group or individual video education with verbal and written material and guidebook.  Patient learns that body composition (ratio of muscle mass to fat mass) is a key component to assessing overall fitness, rather than body weight alone. Increased fat mass, especially visceral belly fat, can put us  at increased risk for metabolic syndrome, type 2 diabetes, heart disease, and even death. It is recommended to combine diet and exercise (cardiovascular and resistance training) to improve your body composition. Seek guidance from your physician and exercise physiologist before implementing an exercise routine.  Exercise Action Plan Clinical staff conducted group or individual video education with verbal and written material and guidebook.  Patient learns the recommended strategies to achieve and enjoy long-term exercise adherence, including variety, self-motivation, self-efficacy, and positive decision making. Benefits of exercise include fitness, good health, weight management, more energy, better sleep, less stress, and overall well-being.  Medical   Heart Disease Risk Reduction Clinical staff conducted group or individual video education with verbal and written material and guidebook.  Patient learns our heart is our most vital organ as it circulates oxygen, nutrients, white blood cells, and hormones throughout the entire body, and carries waste away. Data supports a plant-based eating plan like the Pritikin Program for its effectiveness in slowing progression of and reversing heart disease. The video provides a number of recommendations to address heart disease.   Metabolic Syndrome and Belly Fat  Clinical staff conducted group or individual video education with verbal and written material and guidebook.  Patient learns what metabolic syndrome is, how it leads to heart disease, and how one can reverse it and keep it from coming back. You have metabolic syndrome if you have 3 of the following 5  criteria: abdominal obesity, high blood pressure, high triglycerides, low HDL cholesterol, and high blood sugar.  Hypertension and Heart Disease Clinical staff conducted group or individual video education with verbal and written material and guidebook.  Patient learns that high blood pressure, or hypertension, is very common in the [place] . Hypertension is largely due to excessive salt intake, but other important risk factors include being overweight, physical inactivity, drinking too much alcohol, smoking, and not eating enough potassium from fruits and vegetables. High blood pressure is a leading risk factor for heart attack, stroke, congestive heart failure, dementia, kidney failure,  and premature death. Long-term effects of excessive salt intake include stiffening of the arteries and thickening of heart muscle and organ damage. Recommendations include ways to reduce hypertension and the risk of heart disease.  Diseases of Our Time - Focusing on Diabetes Clinical staff conducted group or individual video education with verbal and written material and guidebook.  Patient learns why the best way to stop diseases of our time is prevention, through food and other lifestyle changes. Medicine (such as prescription pills and surgeries) is often only a Band-Aid on the problem, not a long-term solution. Most common diseases of our time include obesity, type 2 diabetes, hypertension, heart disease, and cancer. The Pritikin Program is recommended and has been proven to help reduce, reverse, and/or prevent the damaging effects of metabolic syndrome.  Nutrition   Overview of the Pritikin Eating Plan  Clinical staff conducted group or individual video education with verbal and written material and guidebook.  Patient learns about the Pritikin Eating Plan for disease risk reduction. The Pritikin Eating Plan emphasizes a wide variety of unrefined, minimally-processed carbohydrates, like fruits, vegetables,  whole grains, and legumes. Go, Caution, and Stop food choices are explained. Plant-based and lean animal proteins are emphasized. Rationale provided for low sodium intake for blood pressure control, low added sugars for blood sugar stabilization, and low added fats and oils for coronary artery disease risk reduction and weight management.  Calorie Density  Clinical staff conducted group or individual video education with verbal and written material and guidebook.  Patient learns about calorie density and how it impacts the Pritikin Eating Plan. Knowing the characteristics of the food you choose will help you decide whether those foods will lead to weight gain or weight loss, and whether you want to consume more or less of them. Weight loss is usually a side effect of the Pritikin Eating Plan because of its focus on low calorie-dense foods.  Label Reading  Clinical staff conducted group or individual video education with verbal and written material and guidebook.  Patient learns about the Pritikin recommended label reading guidelines and corresponding recommendations regarding calorie density, added sugars, sodium content, and whole grains.  Dining Out - Part 1  Clinical staff conducted group or individual video education with verbal and written material and guidebook.  Patient learns that restaurant meals can be sabotaging because they can be so high in calories, fat, sodium, and/or sugar. Patient learns recommended strategies on how to positively address this and avoid unhealthy pitfalls.  Facts on Fats  Clinical staff conducted group or individual video education with verbal and written material and guidebook.  Patient learns that lifestyle modifications can be just as effective, if not more so, as many medications for lowering your risk of heart disease. A Pritikin lifestyle can help to reduce your risk of inflammation and atherosclerosis (cholesterol build-up, or plaque, in the artery walls).  Lifestyle interventions such as dietary choices and physical activity address the cause of atherosclerosis. A review of the types of fats and their impact on blood cholesterol levels, along with dietary recommendations to reduce fat intake is also included.  Nutrition Action Plan  Clinical staff conducted group or individual video education with verbal and written material and guidebook.  Patient learns how to incorporate Pritikin recommendations into their lifestyle. Recommendations include planning and keeping personal health goals in mind as an important part of their success.  Healthy Mind-Set    Healthy Minds, Bodies, Hearts  Clinical staff conducted group or individual video education with  verbal and written material and guidebook.  Patient learns how to identify when they are stressed. Video will discuss the impact of that stress, as well as the many benefits of stress management. Patient will also be introduced to stress management techniques. The way we think, act, and feel has an impact on our hearts.  How Our Thoughts Can Heal Our Hearts  Clinical staff conducted group or individual video education with verbal and written material and guidebook.  Patient learns that negative thoughts can cause depression and anxiety. This can result in negative lifestyle behavior and serious health problems. Cognitive behavioral therapy is an effective method to help control our thoughts in order to change and improve our emotional outlook.  Additional Videos:  Exercise    Improving Performance  Clinical staff conducted group or individual video education with verbal and written material and guidebook.  Patient learns to use a non-linear approach by alternating intensity levels and lengths of time spent exercising to help burn more calories and lose more body fat. Cardiovascular exercise helps improve heart health, metabolism, hormonal balance, blood sugar control, and recovery from fatigue. Resistance  training improves strength, endurance, balance, coordination, reaction time, metabolism, and muscle mass. Flexibility exercise improves circulation, posture, and balance. Seek guidance from your physician and exercise physiologist before implementing an exercise routine and learn your capabilities and proper form for all exercise.  Introduction to Yoga  Clinical staff conducted group or individual video education with verbal and written material and guidebook.  Patient learns about yoga, a discipline of the coming together of mind, breath, and body. The benefits of yoga include improved flexibility, improved range of motion, better posture and core strength, increased lung function, weight loss, and positive self-image. Yoga's heart health benefits include lowered blood pressure, healthier heart rate, decreased cholesterol and triglyceride levels, improved immune function, and reduced stress. Seek guidance from your physician and exercise physiologist before implementing an exercise routine and learn your capabilities and proper form for all exercise.  Medical   Aging: Enhancing Your Quality of Life  Clinical staff conducted group or individual video education with verbal and written material and guidebook.  Patient learns key strategies and recommendations to stay in good physical health and enhance quality of life, such as prevention strategies, having an advocate, securing a Health Care Proxy and Power of Attorney, and keeping a list of medications and system for tracking them. It also discusses how to avoid risk for bone loss.  Biology of Weight Control  Clinical staff conducted group or individual video education with verbal and written material and guidebook.  Patient learns that weight gain occurs because we consume more calories than we burn (eating more, moving less). Even if your body weight is normal, you may have higher ratios of fat compared to muscle mass. Too much body fat puts you at  increased risk for cardiovascular disease, heart attack, stroke, type 2 diabetes, and obesity-related cancers. In addition to exercise, following the Pritikin Eating Plan can help reduce your risk.  Decoding Lab Results  Clinical staff conducted group or individual video education with verbal and written material and guidebook.  Patient learns that lab test reflects one measurement whose values change over time and are influenced by many factors, including medication, stress, sleep, exercise, food, hydration, pre-existing medical conditions, and more. It is recommended to use the knowledge from this video to become more involved with your lab results and evaluate your numbers to speak with your doctor.   Diseases of Our  Time - Overview  Clinical staff conducted group or individual video education with verbal and written material and guidebook.  Patient learns that according to the CDC, 50% to 70% of chronic diseases (such as obesity, type 2 diabetes, elevated lipids, hypertension, and heart disease) are avoidable through lifestyle improvements including healthier food choices, listening to satiety cues, and increased physical activity.  Sleep Disorders Clinical staff conducted group or individual video education with verbal and written material and guidebook.  Patient learns how good quality and duration of sleep are important to overall health and well-being. Patient also learns about sleep disorders and how they impact health along with recommendations to address them, including discussing with a physician.  Nutrition  Dining Out - Part 2 Clinical staff conducted group or individual video education with verbal and written material and guidebook.  Patient learns how to plan ahead and communicate in order to maximize their dining experience in a healthy and nutritious manner. Included are recommended food choices based on the type of restaurant the patient is visiting.   Fueling a Fish farm manager conducted group or individual video education with verbal and written material and guidebook.  There is a strong connection between our food choices and our health. Diseases like obesity and type 2 diabetes are very prevalent and are in large-part due to lifestyle choices. The Pritikin Eating Plan provides plenty of food and hunger-curbing satisfaction. It is easy to follow, affordable, and helps reduce health risks.  Menu Workshop  Clinical staff conducted group or individual video education with verbal and written material and guidebook.  Patient learns that restaurant meals can sabotage health goals because they are often packed with calories, fat, sodium, and sugar. Recommendations include strategies to plan ahead and to communicate with the manager, chef, or server to help order a healthier meal.  Planning Your Eating Strategy  Clinical staff conducted group or individual video education with verbal and written material and guidebook.  Patient learns about the Pritikin Eating Plan and its benefit of reducing the risk of disease. The Pritikin Eating Plan does not focus on calories. Instead, it emphasizes high-quality, nutrient-rich foods. By knowing the characteristics of the foods, we choose, we can determine their calorie density and make informed decisions.  Targeting Your Nutrition Priorities  Clinical staff conducted group or individual video education with verbal and written material and guidebook.  Patient learns that lifestyle habits have a tremendous impact on disease risk and progression. This video provides eating and physical activity recommendations based on your personal health goals, such as reducing LDL cholesterol, losing weight, preventing or controlling type 2 diabetes, and reducing high blood pressure.  Vitamins and Minerals  Clinical staff conducted group or individual video education with verbal and written material and guidebook.  Patient learns different  ways to obtain key vitamins and minerals, including through a recommended healthy diet. It is important to discuss all supplements you take with your doctor.   Healthy Mind-Set    Smoking Cessation  Clinical staff conducted group or individual video education with verbal and written material and guidebook.  Patient learns that cigarette smoking and tobacco addiction pose a serious health risk which affects millions of people. Stopping smoking will significantly reduce the risk of heart disease, lung disease, and many forms of cancer. Recommended strategies for quitting are covered, including working with your doctor to develop a successful plan.  Culinary   Becoming a Set designer conducted group or individual video education  with verbal and written material and guidebook.  Patient learns that cooking at home can be healthy, cost-effective, quick, and puts them in control. Keys to cooking healthy recipes will include looking at your recipe, assessing your equipment needs, planning ahead, making it simple, choosing cost-effective seasonal ingredients, and limiting the use of added fats, salts, and sugars.  Cooking - Breakfast and Snacks  Clinical staff conducted group or individual video education with verbal and written material and guidebook.  Patient learns how important breakfast is to satiety and nutrition through the entire day. Recommendations include key foods to eat during breakfast to help stabilize blood sugar levels and to prevent overeating at meals later in the day. Planning ahead is also a key component.  Cooking - Educational psychologist conducted group or individual video education with verbal and written material and guidebook.  Patient learns eating strategies to improve overall health, including an approach to cook more at home. Recommendations include thinking of animal protein as a side on your plate rather than center stage and focusing instead on  lower calorie dense options like vegetables, fruits, whole grains, and plant-based proteins, such as beans. Making sauces in large quantities to freeze for later and leaving the skin on your vegetables are also recommended to maximize your experience.  Cooking - Healthy Salads and Dressing Clinical staff conducted group or individual video education with verbal and written material and guidebook.  Patient learns that vegetables, fruits, whole grains, and legumes are the foundations of the Pritikin Eating Plan. Recommendations include how to incorporate each of these in flavorful and healthy salads, and how to create homemade salad dressings. Proper handling of ingredients is also covered. Cooking - Soups and [employer] - Soups and Desserts Clinical staff conducted group or individual video education with verbal and written material and guidebook.  Patient learns that Pritikin soups and desserts make for easy, nutritious, and delicious snacks and meal components that are low in sodium, fat, sugar, and calorie density, while high in vitamins, minerals, and filling fiber. Recommendations include simple and healthy ideas for soups and desserts.   Overview     The Pritikin Solution Program Overview Clinical staff conducted group or individual video education with verbal and written material and guidebook.  Patient learns that the results of the Pritikin Program have been documented in more than 100 articles published in peer-reviewed journals, and the benefits include reducing risk factors for (and, in some cases, even reversing) high cholesterol, high blood pressure, type 2 diabetes, obesity, and more! An overview of the three key pillars of the Pritikin Program will be covered: eating well, doing regular exercise, and having a healthy mind-set.  WORKSHOPS  Exercise: Exercise Basics: Building Your Action Plan Clinical staff led group instruction and group discussion with PowerPoint presentation  and patient guidebook. To enhance the learning environment the use of posters, models and videos may be added. At the conclusion of this workshop, patients will comprehend the difference between physical activity and exercise, as well as the benefits of incorporating both, into their routine. Patients will understand the FITT (Frequency, Intensity, Time, and Type) principle and how to use it to build an exercise action plan. In addition, safety concerns and other considerations for exercise and cardiac rehab will be addressed by the presenter. The purpose of this lesson is to promote a comprehensive and effective weekly exercise routine in order to improve patients' overall level of fitness.   Managing Heart Disease: Your Path to  a Healthier Heart Clinical staff led group instruction and group discussion with PowerPoint presentation and patient guidebook. To enhance the learning environment the use of posters, models and videos may be added.At the conclusion of this workshop, patients will understand the anatomy and physiology of the heart. Additionally, they will understand how Pritikin's three pillars impact the risk factors, the progression, and the management of heart disease.  The purpose of this lesson is to provide a high-level overview of the heart, heart disease, and how the Pritikin lifestyle positively impacts risk factors.  Exercise Biomechanics Clinical staff led group instruction and group discussion with PowerPoint presentation and patient guidebook. To enhance the learning environment the use of posters, models and videos may be added. Patients will learn how the structural parts of their bodies function and how these functions impact their daily activities, movement, and exercise. Patients will learn how to promote a neutral spine, learn how to manage pain, and identify ways to improve their physical movement in order to promote healthy living. The purpose of this lesson is to  expose patients to common physical limitations that impact physical activity. Participants will learn practical ways to adapt and manage aches and pains, and to minimize their effect on regular exercise. Patients will learn how to maintain good posture while sitting, walking, and lifting.  Balance Training and Fall Prevention  Clinical staff led group instruction and group discussion with PowerPoint presentation and patient guidebook. To enhance the learning environment the use of posters, models and videos may be added. At the conclusion of this workshop, patients will understand the importance of their sensorimotor skills (vision, proprioception, and the vestibular system) in maintaining their ability to balance as they age. Patients will apply a variety of balancing exercises that are appropriate for their current level of function. Patients will understand the common causes for poor balance, possible solutions to these problems, and ways to modify their physical environment in order to minimize their fall risk. The purpose of this lesson is to teach patients about the importance of maintaining balance as they age and ways to minimize their risk of falling.  WORKSHOPS   Nutrition:  Fueling a Ship broker led group instruction and group discussion with PowerPoint presentation and patient guidebook. To enhance the learning environment the use of posters, models and videos may be added. Patients will review the foundational principles of the Pritikin Eating Plan and understand what constitutes a serving size in each of the food groups. Patients will also learn Pritikin-friendly foods that are better choices when away from home and review make-ahead meal and snack options. Calorie density will be reviewed and applied to three nutrition priorities: weight maintenance, weight loss, and weight gain. The purpose of this lesson is to reinforce (in a group setting) the key concepts around  what patients are recommended to eat and how to apply these guidelines when away from home by planning and selecting Pritikin-friendly options. Patients will understand how calorie density may be adjusted for different weight management goals.  Mindful Eating  Clinical staff led group instruction and group discussion with PowerPoint presentation and patient guidebook. To enhance the learning environment the use of posters, models and videos may be added. Patients will briefly review the concepts of the Pritikin Eating Plan and the importance of low-calorie dense foods. The concept of mindful eating will be introduced as well as the importance of paying attention to internal hunger signals. Triggers for non-hunger eating and techniques for dealing with triggers will  be explored. The purpose of this lesson is to provide patients with the opportunity to review the basic principles of the Pritikin Eating Plan, discuss the value of eating mindfully and how to measure internal cues of hunger and fullness using the Hunger Scale. Patients will also discuss reasons for non-hunger eating and learn strategies to use for controlling emotional eating.  Targeting Your Nutrition Priorities Clinical staff led group instruction and group discussion with PowerPoint presentation and patient guidebook. To enhance the learning environment the use of posters, models and videos may be added. Patients will learn how to determine their genetic susceptibility to disease by reviewing their family history. Patients will gain insight into the importance of diet as part of an overall healthy lifestyle in mitigating the impact of genetics and other environmental insults. The purpose of this lesson is to provide patients with the opportunity to assess their personal nutrition priorities by looking at their family history, their own health history and current risk factors. Patients will also be able to discuss ways of prioritizing and  modifying the Pritikin Eating Plan for their highest risk areas  Menu  Clinical staff led group instruction and group discussion with PowerPoint presentation and patient guidebook. To enhance the learning environment the use of posters, models and videos may be added. Using menus brought in from [employer], or printed from [employer], patients will apply the Pritikin dining out guidelines that were presented in the [employer] video. Patients will also be able to practice these guidelines in a variety of provided scenarios. The purpose of this lesson is to provide patients with the opportunity to practice hands-on learning of the Pritikin Dining Out guidelines with actual menus and practice scenarios.  Label Reading Clinical staff led group instruction and group discussion with PowerPoint presentation and patient guidebook. To enhance the learning environment the use of posters, models and videos may be added. Patients will review and discuss the Pritikin label reading guidelines presented in Pritikin's Label Reading Educational series video. Using fool labels brought in from local grocery stores and markets, patients will apply the label reading guidelines and determine if the packaged food meet the Pritikin guidelines. The purpose of this lesson is to provide patients with the opportunity to review, discuss, and practice hands-on learning of the Pritikin Label Reading guidelines with actual packaged food labels. Cooking School  Pritikin's [employer] are designed to teach patients ways to prepare quick, simple, and affordable recipes at home. The importance of nutrition's role in chronic disease risk reduction is reflected in its emphasis in the overall Pritikin program. By learning how to prepare essential core Pritikin Eating Plan recipes, patients will increase control over what they eat; be able to customize the flavor of foods without the use of added salt,  sugar, or fat; and improve the quality of the food they consume. By learning a set of core recipes which are easily assembled, quickly prepared, and affordable, patients are more likely to prepare more healthy foods at home. These workshops focus on convenient breakfasts, simple entres, side dishes, and desserts which can be prepared with minimal effort and are consistent with nutrition recommendations for cardiovascular risk reduction. Cooking [employer] are taught by a Armed forces logistics/support/administrative officer (RD) who has been trained by the [employer]. The chef or RD has a clear understanding of the importance of minimizing - if not completely eliminating - added fat, sugar, and sodium in recipes. Throughout the series of Cooking  School Workshop sessions, patients will learn about healthy ingredients and efficient methods of cooking to build confidence in their capability to prepare    Cooking School weekly topics:  Adding Flavor- Sodium-Free  Fast and Healthy Breakfasts  Powerhouse Plant-Based Proteins  Satisfying Salads and Dressings  Simple Sides and Sauces  International Cuisine-Spotlight on the [employer] Zones  Delicious Desserts  Savory Soups  [employer] - Meals in a Astronomer Appetizers and Snacks  Comforting Weekend Breakfasts  One-Pot Wonders   Fast Evening Meals  Landscape architect Your Pritikin Plate  WORKSHOPS   Healthy Mindset (Psychosocial):  Focused Goals, Sustainable Changes Clinical staff led group instruction and group discussion with PowerPoint presentation and patient guidebook. To enhance the learning environment the use of posters, models and videos may be added. Patients will be able to apply effective goal setting strategies to establish at least one personal goal, and then take consistent, meaningful action toward that goal. They will learn to identify common barriers to achieving personal goals and develop strategies to overcome  them. Patients will also gain an understanding of how our mind-set can impact our ability to achieve goals and the importance of cultivating a positive and growth-oriented mind-set. The purpose of this lesson is to provide patients with a deeper understanding of how to set and achieve personal goals, as well as the tools and strategies needed to overcome common obstacles which may arise along the way.  From Head to Heart: The Power of a Healthy Outlook  Clinical staff led group instruction and group discussion with PowerPoint presentation and patient guidebook. To enhance the learning environment the use of posters, models and videos may be added. Patients will be able to recognize and describe the impact of emotions and mood on physical health. They will discover the importance of self-care and explore self-care practices which may work for them. Patients will also learn how to utilize the 4 C's to cultivate a healthier outlook and better manage stress and challenges. The purpose of this lesson is to demonstrate to patients how a healthy outlook is an essential part of maintaining good health, especially as they continue their cardiac rehab journey.  Healthy Sleep for a Healthy Heart Clinical staff led group instruction and group discussion with PowerPoint presentation and patient guidebook. To enhance the learning environment the use of posters, models and videos may be added. At the conclusion of this workshop, patients will be able to demonstrate knowledge of the importance of sleep to overall health, well-being, and quality of life. They will understand the symptoms of, and treatments for, common sleep disorders. Patients will also be able to identify daytime and nighttime behaviors which impact sleep, and they will be able to apply these tools to help manage sleep-related challenges. The purpose of this lesson is to provide patients with a general overview of sleep and outline the importance of quality  sleep. Patients will learn about a few of the most common sleep disorders. Patients will also be introduced to the concept of "sleep hygiene," and discover ways to self-manage certain sleeping problems through simple daily behavior changes. Finally, the workshop will motivate patients by clarifying the links between quality sleep and their goals of heart-healthy living.   Recognizing and Reducing Stress Clinical staff led group instruction and group discussion with PowerPoint presentation and patient guidebook. To enhance the learning environment the use of posters, models and videos may be added. At the conclusion of this workshop, patients will be able to  understand the types of stress reactions, differentiate between acute and chronic stress, and recognize the impact that chronic stress has on their health. They will also be able to apply different coping mechanisms, such as reframing negative self-talk. Patients will have the opportunity to practice a variety of stress management techniques, such as deep abdominal breathing, progressive muscle relaxation, and/or guided imagery.  The purpose of this lesson is to educate patients on the role of stress in their lives and to provide healthy techniques for coping with it.  Learning Barriers/Preferences:  Learning Barriers/Preferences - 02/24/24 1536       Learning Barriers/Preferences   Learning Preferences Computer/Internet;Group Instruction;Individual Instruction;Pictoral;Video;Written Material;Verbal Instruction;Skilled Demonstration;Audio          Education Topics:  Knowledge Questionnaire Score:  Knowledge Questionnaire Score - 02/24/24 1537       Knowledge Questionnaire Score   Pre Score 22/24          Core Components/Risk Factors/Patient Goals at Admission:  Personal Goals and Risk Factors at Admission - 02/24/24 1545       Core Components/Risk Factors/Patient Goals on Admission   Lipids Yes    Intervention Provide education  and support for participant on nutrition & aerobic/resistive exercise along with prescribed medications to achieve LDL 70mg , HDL >40mg .    Expected Outcomes Short Term: Participant states understanding of desired cholesterol values and is compliant with medications prescribed. Participant is following exercise prescription and nutrition guidelines.;Long Term: Cholesterol controlled with medications as prescribed, with individualized exercise RX and with personalized nutrition plan. Value goals: LDL < 70mg , HDL > 40 mg.    Personal Goal Other Yes    Personal Goal Short: get into Ex routine, Long term: increase strength and stamina    Intervention Will continue to monitor pt and progress workloads as tolerated without sign or symptom    Expected Outcomes Pt will achieve his goals          Core Components/Risk Factors/Patient Goals Review:    Core Components/Risk Factors/Patient Goals at Discharge (Final Review):    ITP Comments:   Comments: Participant attended orientation for the cardiac rehabilitation program on  02/24/2024  to perform initial intake and exercise walk test. Patient introduced to the Pritikin Program education and orientation packet was reviewed. Completed 6-minute walk test, measurements, initial ITP, and exercise prescription. Vital signs stable. Telemetry-normal sinus rhythm, asymptomatic.   Service time was from 1306 to 1500.

## 2024-02-28 ENCOUNTER — Encounter (HOSPITAL_COMMUNITY)
Admission: RE | Admit: 2024-02-28 | Discharge: 2024-02-28 | Disposition: A | Discharge status: Home / Self Care | Source: Ambulatory Visit | Attending: Internal Medicine

## 2024-02-28 DIAGNOSIS — Z955 Presence of coronary angioplasty implant and graft: Secondary | ICD-10-CM | POA: Diagnosis not present

## 2024-02-28 DIAGNOSIS — I213 ST elevation (STEMI) myocardial infarction of unspecified site: Secondary | ICD-10-CM

## 2024-02-28 DIAGNOSIS — Z48812 Encounter for surgical aftercare following surgery on the circulatory system: Secondary | ICD-10-CM | POA: Diagnosis not present

## 2024-02-28 DIAGNOSIS — I252 Old myocardial infarction: Secondary | ICD-10-CM | POA: Diagnosis not present

## 2024-02-29 NOTE — Progress Notes (Signed)
 Daily Session Note  Patient Details  Name: Peter [name] MRN: [id number] Date of Birth: 01-29-1966 Referring Provider:   Flowsheet Row INTENSIVE CARDIAC REHAB ORIENT from 02/24/2024 in [facility]  Referring Provider Dr. Lurena Red, MD    Encounter Date: 02/28/2024  Check In:  Session Check In - 02/28/24 1314       Check-In   Supervising physician immediately available to respond to emergencies [facility] MD immediately available    Physician(s) Damien Braver, NP    Location [facility]    Staff Present Alec Finder BS, ACSM-CEP, Exercise Physiologist;Flo Berroa, RN, Avonne Gal, MS, ACSM-CEP, Exercise Physiologist;Johnny Fayette, MS, Exercise Physiologist;Bailey Elnor, MS, Exercise Physiologist;David Janann, MS, ACSM-CEP, CCRP, Exercise Physiologist    Virtual Visit No    Medication changes reported     No    Fall or balance concerns reported    No    Tobacco Cessation No Change    Warm-up and Cool-down Performed as group-led instruction   Cardiac orientation   Resistance Training Performed Yes    VAD Patient? No    PAD/SET Patient? No      Pain Assessment   Currently in Pain? No/denies    Pain Score 0-No pain    Multiple Pain Sites No          Capillary Blood Glucose: No results found for this or any previous visit (from the past 24 hours).   Exercise Prescription Changes - 02/28/24 1400       Response to Exercise   Blood Pressure (Admit) 104/70    Blood Pressure (Exercise) 108/64    Blood Pressure (Exit) 102/64    Heart Rate (Admit) 90 bpm    Heart Rate (Exercise) 128 bpm    Heart Rate (Exit) 98 bpm    Rating of Perceived Exertion (Exercise) 12    Symptoms None    Comments Pt first day    Duration Continue with 30 min of aerobic exercise without signs/symptoms of physical distress.    Intensity THRR unchanged      Progression   Progression Continue to progress workloads to  maintain intensity without signs/symptoms of physical distress.    Average METs 4.2      Resistance Training   Training Prescription Yes    Weight 4    Reps 10-15      Treadmill   MPH 3.7    Grade 0    Minutes 15    METs 3.83      Recumbant Elliptical   Level 2    RPM 56    Watts 90    Minutes 15    METs 4.6          Social History   Tobacco Use  Smoking Status Never  Smokeless Tobacco Never    Goals Met:  Exercise tolerated well No report of concerns or symptoms today Strength training completed today  Goals Unmet:  Not Applicable  Comments: Pt started cardiac rehab today.  Pt tolerated light exercise without difficulty. VSS, telemetry-Sinus Rhythm, asymptomatic.  Medication list reconciled. Pt denies barriers to medicaiton compliance.  PSYCHOSOCIAL ASSESSMENT:  PHQ-0. Pt exhibits positive coping skills, hopeful outlook with supportive family. No psychosocial needs identified at this time, no psychosocial interventions necessary.    Pt enjoys Spending time with his family and pets.   Pt oriented to exercise equipment and routine.    Understanding verbalized.Hadassah Elpidio Quan RN BSN  Dr. Wilbert Bihari is Medical Director for Cardiac Rehab at [facility].

## 2024-03-01 ENCOUNTER — Encounter (HOSPITAL_COMMUNITY)
Admission: RE | Admit: 2024-03-01 | Discharge: 2024-03-01 | Disposition: A | Discharge status: Home / Self Care | Source: Ambulatory Visit | Attending: Internal Medicine | Admitting: Internal Medicine

## 2024-03-01 DIAGNOSIS — I252 Old myocardial infarction: Secondary | ICD-10-CM | POA: Diagnosis not present

## 2024-03-01 DIAGNOSIS — Z955 Presence of coronary angioplasty implant and graft: Secondary | ICD-10-CM

## 2024-03-01 DIAGNOSIS — Z48812 Encounter for surgical aftercare following surgery on the circulatory system: Secondary | ICD-10-CM | POA: Diagnosis not present

## 2024-03-01 DIAGNOSIS — I213 ST elevation (STEMI) myocardial infarction of unspecified site: Secondary | ICD-10-CM

## 2024-03-03 ENCOUNTER — Encounter (HOSPITAL_COMMUNITY): Admission: RE | Admit: 2024-03-03 | Source: Ambulatory Visit

## 2024-03-03 ENCOUNTER — Telehealth (HOSPITAL_COMMUNITY): Payer: Self-pay

## 2024-03-03 NOTE — Telephone Encounter (Signed)
 Patient c/o sick for 12:30 CR class, states he has taken a couple COVID tests that have been negative, thinks he may have the flu. Advised patient he needs to be 48 hour symptom-free before returning to class, patient acknowledged.

## 2024-03-06 ENCOUNTER — Encounter (HOSPITAL_COMMUNITY)
Admission: RE | Admit: 2024-03-06 | Discharge: 2024-03-06 | Disposition: A | Discharge status: Home / Self Care | Source: Ambulatory Visit | Attending: Internal Medicine | Admitting: Internal Medicine

## 2024-03-06 DIAGNOSIS — I213 ST elevation (STEMI) myocardial infarction of unspecified site: Secondary | ICD-10-CM

## 2024-03-06 DIAGNOSIS — Z955 Presence of coronary angioplasty implant and graft: Secondary | ICD-10-CM

## 2024-03-06 DIAGNOSIS — I252 Old myocardial infarction: Secondary | ICD-10-CM | POA: Diagnosis not present

## 2024-03-06 DIAGNOSIS — Z48812 Encounter for surgical aftercare following surgery on the circulatory system: Secondary | ICD-10-CM | POA: Diagnosis not present

## 2024-03-07 NOTE — Progress Notes (Signed)
 Cardiac Individual Treatment Plan  Patient Details  Name: Peter [name] MRN: [id number] Date of Birth: [birth year] Referring Provider:   Flowsheet Row INTENSIVE CARDIAC REHAB ORIENT from 02/24/2024 in [facility]  Referring Provider Dr. Lurena Red, MD    Initial Encounter Date:  Flowsheet Row INTENSIVE CARDIAC REHAB ORIENT from 02/24/2024 in [facility], [facility]  Date 02/24/24    Visit Diagnosis: Status post coronary artery stent placement  ST elevation myocardial infarction (STEMI), unspecified artery (HCC)  Patient's Home Medications on Admission:  Current Outpatient Medications:    aspirin  81 MG chewable tablet, Chew 1 tablet (81 mg total) by mouth daily., Disp: 90 tablet, Rfl: 2   atorvastatin  (LIPITOR ) 80 MG tablet, Take 1 tablet (80 mg total) by mouth daily., Disp: 90 tablet, Rfl: 1   EPINEPHrine (PRIMATENE MIST) 0.125 MG/ACT AERO, Inhale 1 puff into the lungs as needed (SOB/Asthma)., Disp: , Rfl:    famotidine (PEPCID) 20 MG tablet, Take 20 mg by mouth daily., Disp: , Rfl:    metoprolol  succinate (TOPROL -XL) 25 MG 24 hr tablet, Take 1 tablet (25 mg total) by mouth daily., Disp: 90 tablet, Rfl: 1   nitroGLYCERIN  (NITROSTAT ) 0.4 MG SL tablet, Place 1 tablet (0.4 mg total) under the tongue every 5 (five) minutes as needed for chest pain., Disp: 25 tablet, Rfl: 2   prasugrel  (EFFIENT ) 10 MG TABS tablet, Take 1 tablet (10 mg total) by mouth daily., Disp: 90 tablet, Rfl: 2   sodium fluoride  (FLUORISHIELD) 1.1 % GEL dental gel, Place 1 Application onto teeth at bedtime., Disp: , Rfl:   Past Medical History: Past Medical History:  Diagnosis Date   COVID 04/04/2021   states mild sx   Degenerative disc disease, lumbar    Dr. Duwayne   Dyslipidemia    GERD (gastroesophageal reflux disease)    Helicobacter pylori antibody positive 02/21/2005   treated with antibiotics     Tobacco Use: Social History   Tobacco Use  Smoking Status Never  Smokeless Tobacco Never    Labs: Review Flowsheet  More data may exist      Latest Ref Rng & Units 07/23/2011 02/27/2016 05/25/2019 03/25/2023 01/29/2024  Labs for ITP Cardiac and Pulmonary Rehab  Cholestrol 0 - 200 mg/dL - 873  887  860  887   LDL (calc) 0 - 99 mg/dL - 58  48  69  39   HDL-C >40 mg/dL - 41  33  61.69  30   Trlycerides <150 mg/dL - 865  812  843.9  786   Hemoglobin A1c 4.8 - 5.6 % - 5.6  - 6.0  6.0   PH, Arterial 7.35 - 7.45 - - - - 7.322  7.393   PCO2 arterial 32 - 48 mmHg - - - - 52.6  29.5   Bicarbonate 20.0 - 28.0 mmol/L - - - - 28.3  27.9  27.3  18.0   TCO2 22 - 32 mmol/L 23  - - - 30  30  29  19    Acid-base deficit 0.0 - 2.0 mmol/L - - - - 6.0   O2 Saturation % - - - - 73  76  95  96     Details       Multiple values from one day are sorted in reverse-chronological order         Capillary Blood Glucose: Lab Results  Component Value Date  GLUCAP 166 (H) 01/29/2024   GLUCAP 182 (H) 01/29/2024   GLUCAP 71 09/30/2023   GLUCAP 63 (L) 09/30/2023     Exercise Target Goals: Exercise Program Goal: Individual exercise prescription set using results from initial 6 min walk test and THRR while considering  patient's activity barriers and safety.   Exercise Prescription Goal: Initial exercise prescription builds to 30-45 minutes a day of aerobic activity, 2-3 days per week.  Home exercise guidelines will be given to patient during program as part of exercise prescription that the participant will acknowledge.  Activity Barriers & Risk Stratification:  Activity Barriers & Cardiac Risk Stratification - 02/24/24 1522       Activity Barriers & Cardiac Risk Stratification   Activity Barriers None    Cardiac Risk Stratification High   Under 5 MET's         6 Minute Walk:  6 Minute Walk     Row Name 02/24/24 1513         6 Minute Walk   Phase Initial     Distance 1900 feet      Walk Time 6 minutes     # of Rest Breaks 0     MPH 3.6     METS 3.6     RPE 11     Perceived Dyspnea  0     VO2 Peak 16.62     Symptoms No     Resting HR 72 bpm     Resting BP 106/64     Resting Oxygen Saturation  96 %     Exercise Oxygen Saturation  during 6 min walk 97 %     Max Ex. HR 98 bpm     Max Ex. BP 118/60     2 Minute Post BP 114/68        Oxygen Initial Assessment:   Oxygen Re-Evaluation:   Oxygen Discharge (Final Oxygen Re-Evaluation):   Initial Exercise Prescription:  Initial Exercise Prescription - 02/24/24 1500       Date of Initial Exercise RX and Referring Provider   Date 02/24/24    Referring Provider Dr. Lurena Red, MD    Expected Discharge Date 05/17/24      Treadmill   MPH 3.8    Grade 0    Minutes 15    METs 3.91      Recumbant Elliptical   Level 2    RPM 45    Watts 90    Minutes 15    METs 3.5      Prescription Details   Frequency (times per week) 3    Duration Progress to 30 minutes of continuous aerobic without signs/symptoms of physical distress      Intensity   THRR 40-80% of Max Heartrate 65-131    Ratings of Perceived Exertion 11-13    Perceived Dyspnea 0-4      Progression   Progression Continue progressive overload as per policy without signs/symptoms or physical distress.      Resistance Training   Training Prescription Yes    Weight 3    Reps 10-15          Perform Capillary Blood Glucose checks as needed.  Exercise Prescription Changes:   Exercise Prescription Changes     Row Name 02/28/24 1400             Response to Exercise   Blood Pressure (Admit) 104/70       Blood Pressure (Exercise) 108/64  Blood Pressure (Exit) 102/64       Heart Rate (Admit) 90 bpm       Heart Rate (Exercise) 128 bpm       Heart Rate (Exit) 98 bpm       Rating of Perceived Exertion (Exercise) 12       Symptoms None       Comments Pt first day       Duration Continue with 30 min of aerobic exercise without  signs/symptoms of physical distress.       Intensity THRR unchanged         Progression   Progression Continue to progress workloads to maintain intensity without signs/symptoms of physical distress.       Average METs 4.2         Resistance Training   Training Prescription Yes       Weight 4       Reps 10-15         Treadmill   MPH 3.7       Grade 0       Minutes 15       METs 3.83         Recumbant Elliptical   Level 2       RPM 56       Watts 90       Minutes 15       METs 4.6          Exercise Comments:   Exercise Comments     Row Name 02/28/24 1406           Exercise Comments Pt first day in program. Pt tolerated 30 minutes of aerobic exercise and resistance exercise w/o unusual signs and symptoms. Pt averaged 4.2 METs for the day.          Exercise Goals and Review:   Exercise Goals     Row Name 02/24/24 1529             Exercise Goals   Increase Physical Activity Yes       Intervention Provide advice, education, support and counseling about physical activity/exercise needs.;Develop an individualized exercise prescription for aerobic and resistive training based on initial evaluation findings, risk stratification, comorbidities and participant's personal goals.       Expected Outcomes Long Term: Exercising regularly at least 3-5 days a week.;Short Term: Attend rehab on a regular basis to increase amount of physical activity.;Long Term: Add in home exercise to make exercise part of routine and to increase amount of physical activity.       Increase Strength and Stamina Yes       Intervention Provide advice, education, support and counseling about physical activity/exercise needs.;Develop an individualized exercise prescription for aerobic and resistive training based on initial evaluation findings, risk stratification, comorbidities and participant's personal goals.       Expected Outcomes Short Term: Increase workloads from initial exercise prescription  for resistance, speed, and METs.;Short Term: Perform resistance training exercises routinely during rehab and add in resistance training at home;Long Term: Improve cardiorespiratory fitness, muscular endurance and strength as measured by increased METs and functional capacity ( )       Able to understand and use rate of perceived exertion (RPE) scale Yes       Intervention Provide education and explanation on how to use RPE scale       Expected Outcomes Short Term: Able to use RPE daily in rehab to express subjective intensity level;Long Term:  Able  to use RPE to guide intensity level when exercising independently       Knowledge and understanding of Target Heart Rate Range (THRR) Yes       Intervention Provide education and explanation of THRR including how the numbers were predicted and where they are located for reference       Expected Outcomes Short Term: Able to state/look up THRR;Long Term: Able to use THRR to govern intensity when exercising independently;Short Term: Able to use daily as guideline for intensity in rehab       Understanding of Exercise Prescription Yes       Intervention Provide education, explanation, and written materials on patient's individual exercise prescription       Expected Outcomes Short Term: Able to explain program exercise prescription;Long Term: Able to explain home exercise prescription to exercise independently          Exercise Goals Re-Evaluation :  Exercise Goals Re-Evaluation     Row Name 02/28/24 1632             Exercise Goal Re-Evaluation   Exercise Goals Review Increase Physical Activity;Increase Strength and Stamina;Able to understand and use rate of perceived exertion (RPE) scale;Knowledge and understanding of Target Heart Rate Range (THRR);Understanding of Exercise Prescription       Comments Pt's first day in the [facility] program. Pt understands the exercise Rx, THRR, and RPE scale.       Expected Outcomes Will continue to monitor patient  and progress exercise workloads as tolerated.          Discharge Exercise Prescription (Final Exercise Prescription Changes):  Exercise Prescription Changes - 02/28/24 1400       Response to Exercise   Blood Pressure (Admit) 104/70    Blood Pressure (Exercise) 108/64    Blood Pressure (Exit) 102/64    Heart Rate (Admit) 90 bpm    Heart Rate (Exercise) 128 bpm    Heart Rate (Exit) 98 bpm    Rating of Perceived Exertion (Exercise) 12    Symptoms None    Comments Pt first day    Duration Continue with 30 min of aerobic exercise without signs/symptoms of physical distress.    Intensity THRR unchanged      Progression   Progression Continue to progress workloads to maintain intensity without signs/symptoms of physical distress.    Average METs 4.2      Resistance Training   Training Prescription Yes    Weight 4    Reps 10-15      Treadmill   MPH 3.7    Grade 0    Minutes 15    METs 3.83      Recumbant Elliptical   Level 2    RPM 56    Watts 90    Minutes 15    METs 4.6          Nutrition:  Target Goals: Understanding of nutrition guidelines, daily intake of sodium 1500mg , cholesterol 200mg , calories 30% from fat and 7% or less from saturated fats, daily to have 5 or more servings of fruits and vegetables.  Biometrics:   Post Biometrics - 02/24/24 1512        Post  Biometrics   Waist Circumference 34.75 inches    Hip Circumference 36.5 inches    Waist to Hip Ratio 0.95 %    Triceps Skinfold 11 mm    % Body Fat 16.5 %    Grip Strength 33 kg    Flexibility --   Unable  to reach   Single Leg Stand 30 seconds          Nutrition Therapy Plan and Nutrition Goals:  Nutrition Therapy & Goals - 02/28/24 1334       Nutrition Therapy   Diet Heart healthy diet    Drug/Food Interactions Statins/Certain Fruits      Personal Nutrition Goals   Nutrition Goal Patient to identify strategies for reducing cardiovascular risk by attending the [employer] education and  nutrition series weekly.    Personal Goal #2 Patient to improve diet quality by using the plate method as a guide for meal planning to include lean protein/plant protein, fruits, vegetables, whole grains, nonfat dairy as part of a well-balanced diet.    Comments Patient has medical history of STEMI s/p PCI/DES, hyperlipidemia with goal LDL <55. LDL is at goal. A1c and triglycerides are elevated in a pre-diabetic range. Patient will benefit from participation in intensive cardiac rehab for nutrition education, exercise, and lifestyle modification.      Intervention Plan   Intervention Prescribe, educate and counsel regarding individualized specific dietary modifications aiming towards targeted core components such as weight, hypertension, lipid management, diabetes, heart failure and other comorbidities.;Nutrition handout(s) given to patient.    Expected Outcomes Short Term Goal: Understand basic principles of dietary content, such as calories, fat, sodium, cholesterol and nutrients.;Long Term Goal: Adherence to prescribed nutrition plan.          Nutrition Assessments:  Nutrition Assessments - 02/28/24 1437       Rate Your Plate Scores   Pre Score 79         MEDIFICTS Score Key: >=70 Need to make dietary changes  40-70 Heart Healthy Diet <= 40 Therapeutic Level Cholesterol Diet   Flowsheet Row INTENSIVE CARDIAC REHAB from 02/28/2024 in [facility]  Picture Your Plate Total Score on Admission 79   Picture Your Plate Scores: <59 Unhealthy dietary pattern with much room for improvement. 41-50 Dietary pattern unlikely to meet recommendations for good health and room for improvement. 51-60 More healthful dietary pattern, with some room for improvement.  >60 Healthy dietary pattern, although there may be some specific behaviors that could be improved.    Nutrition Goals Re-Evaluation:  Nutrition Goals Re-Evaluation     Row  Name 02/28/24 1334             Goals   Current Weight 159 lb 13.3 oz (72.5 kg)       Comment Lpa WNL, triglycerides 213, HDL 30, LDL 39       Expected Outcome Patient has medical history of STEMI s/p PCI/DES, hyperlipidemia with goal LDL <55. LDL is at goal. A1c and triglycerides are elevated in a pre-diabetic range. Patient will benefit from participation in intensive cardiac rehab for nutrition education, exercise, and lifestyle modification.          Nutrition Goals Re-Evaluation:  Nutrition Goals Re-Evaluation     Row Name 02/28/24 1334             Goals   Current Weight 159 lb 13.3 oz (72.5 kg)       Comment Lpa WNL, triglycerides 213, HDL 30, LDL 39       Expected Outcome Patient has medical history of STEMI s/p PCI/DES, hyperlipidemia with goal LDL <55. LDL is at goal. A1c and triglycerides are elevated in a pre-diabetic range. Patient will benefit from participation in intensive cardiac rehab for nutrition education, exercise, and lifestyle  modification.          Nutrition Goals Discharge (Final Nutrition Goals Re-Evaluation):  Nutrition Goals Re-Evaluation - 02/28/24 1334       Goals   Current Weight 159 lb 13.3 oz (72.5 kg)    Comment Lpa WNL, triglycerides 213, HDL 30, LDL 39    Expected Outcome Patient has medical history of STEMI s/p PCI/DES, hyperlipidemia with goal LDL <55. LDL is at goal. A1c and triglycerides are elevated in a pre-diabetic range. Patient will benefit from participation in intensive cardiac rehab for nutrition education, exercise, and lifestyle modification.          Psychosocial: Target Goals: Acknowledge presence or absence of significant depression and/or stress, maximize coping skills, provide positive support system. Participant is able to verbalize types and ability to use techniques and skills needed for reducing stress and depression.  Initial Review & Psychosocial Screening:  Initial Psych Review & Screening - 02/24/24 1534        Initial Review   Current issues with None Identified      Family Dynamics   Good Support System? Yes   Has good support from family, no needs at this time     Barriers   Psychosocial barriers to participate in program There are no identifiable barriers or psychosocial needs.      Screening Interventions   Interventions Encouraged to exercise;To provide support and resources with identified psychosocial needs;Provide feedback about the scores to participant    Expected Outcomes Long Term Goal: Stressors or current issues are controlled or eliminated.;Short Term goal: Identification and review with participant of any Quality of Life or Depression concerns found by scoring the questionnaire.;Long Term goal: The participant improves quality of Life and PHQ9 Scores as seen by post scores and/or verbalization of changes          Quality of Life Scores:  Quality of Life - 02/24/24 1535       Quality of Life   Select Quality of Life      Quality of Life Scores   Health/Function Pre 29.2 %    Socioeconomic Pre 30 %    Psych/Spiritual Pre 30 %    Family Pre 30 %    GLOBAL Pre 29.65 %         Scores of 19 and below usually indicate a poorer quality of life in these areas.  A difference of  2-3 points is a clinically meaningful difference.  A difference of 2-3 points in the total score of the Quality of Life Index has been associated with significant improvement in overall quality of life, self-image, physical symptoms, and general health in studies assessing change in quality of life.  PHQ-9: Review Flowsheet  More data exists      02/24/2024 02/16/2024 02/03/2024 01/13/2024 04/15/2023  Depression screen PHQ 2/9  Decreased Interest 0 0 0 0 0  Down, Depressed, Hopeless 0 0 0 0 0  PHQ - 2 Score 0 0 0 0 0  Altered sleeping 0 0 - - -  Tired, decreased energy 0 0 - - -  Change in appetite 0 0 - - -  Feeling bad or failure about yourself  0 0 - - -  Trouble concentrating 0 0 - - -   Moving slowly or fidgety/restless 0 0 - - -  Suicidal thoughts 0 0 - - -  PHQ-9 Score 0 0 - - -  Difficult doing work/chores Not difficult at all Not difficult at all - - -  Interpretation of Total Score  Total Score Depression Severity:  1-4 = Minimal depression, 5-9 = Mild depression, 10-14 = Moderate depression, 15-19 = Moderately severe depression, 20-27 = Severe depression   Psychosocial Evaluation and Intervention:   Psychosocial Re-Evaluation:  Psychosocial Re-Evaluation     Row Name 02/29/24 [phone]             Psychosocial Re-Evaluation   Current issues with None Identified       Interventions Encouraged to attend Cardiac Rehabilitation for the exercise       Continue Psychosocial Services  No Follow up required          Psychosocial Discharge (Final Psychosocial Re-Evaluation):  Psychosocial Re-Evaluation - 02/29/24 0812       Psychosocial Re-Evaluation   Current issues with None Identified    Interventions Encouraged to attend Cardiac Rehabilitation for the exercise    Continue Psychosocial Services  No Follow up required          Vocational Rehabilitation: Provide vocational rehab assistance to qualifying candidates.   Vocational Rehab Evaluation & Intervention:  Vocational Rehab - 02/24/24 1545       Initial Vocational Rehab Evaluation & Intervention   Assessment shows need for Vocational Rehabilitation No   No needs         Education: Education Goals: Education classes will be provided on a weekly basis, covering required topics. Participant will state understanding/return demonstration of topics presented.    Education     Row Name 02/28/24 1300     Education   Cardiac Education Topics [facility]ikin   Geographical information systems officer Psychosocial   Psychosocial Workshop Healthy Sleep for a Healthy Heart   Instruction Review Code 1- Verbalizes Understanding   Class Start Time 1400   Class Stop  Time 1443   Class Time Calculation (min) 43 min    Row Name 03/01/24 1500     Education   Cardiac Education Topics [facility]   Customer service manager   Weekly Topic Simple Sides and Sauces   Instruction Review Code 1- Verbalizes Understanding   Class Start Time 1400   Class Stop Time 1445   Class Time Calculation (min) 45 min    Row Name 03/06/24 1400     Education   Cardiac Education Topics [facility]in   Hospital doctor Education   General Education Hypertension and Heart Disease   Instruction Review Code 1- Verbalizes Understanding   Class Start Time 1403   Class Stop Time 1445   Class Time Calculation (min) 42 min      Core Videos: Exercise    Move It!  Clinical staff conducted group or individual video education with verbal and written material and guidebook.  Patient learns the recommended Pritikin exercise program. Exercise with the goal of living a long, healthy life. Some of the health benefits of exercise include controlled diabetes, healthier blood pressure levels, improved cholesterol levels, improved heart and lung capacity, improved sleep, and better body composition. Everyone should speak with their doctor before starting or changing an exercise routine.  Biomechanical Limitations Clinical staff conducted group or individual video education with verbal and written material and guidebook.  Patient learns how biomechanical limitations can impact exercise and how we can mitigate and possibly overcome limitations to have an impactful and  balanced exercise routine.  Body Composition Clinical staff conducted group or individual video education with verbal and written material and guidebook.  Patient learns that body composition (ratio of muscle mass to fat mass) is a key component to assessing overall fitness, rather than body weight alone. Increased fat mass,  especially visceral belly fat, can put us  at increased risk for metabolic syndrome, type 2 diabetes, heart disease, and even death. It is recommended to combine diet and exercise (cardiovascular and resistance training) to improve your body composition. Seek guidance from your physician and exercise physiologist before implementing an exercise routine.  Exercise Action Plan Clinical staff conducted group or individual video education with verbal and written material and guidebook.  Patient learns the recommended strategies to achieve and enjoy long-term exercise adherence, including variety, self-motivation, self-efficacy, and positive decision making. Benefits of exercise include fitness, good health, weight management, more energy, better sleep, less stress, and overall well-being.  Medical   Heart Disease Risk Reduction Clinical staff conducted group or individual video education with verbal and written material and guidebook.  Patient learns our heart is our most vital organ as it circulates oxygen, nutrients, white blood cells, and hormones throughout the entire body, and carries waste away. Data supports a plant-based eating plan like the Pritikin Program for its effectiveness in slowing progression of and reversing heart disease. The video provides a number of recommendations to address heart disease.   Metabolic Syndrome and Belly Fat  Clinical staff conducted group or individual video education with verbal and written material and guidebook.  Patient learns what metabolic syndrome is, how it leads to heart disease, and how one can reverse it and keep it from coming back. You have metabolic syndrome if you have 3 of the following 5 criteria: abdominal obesity, high blood pressure, high triglycerides, low HDL cholesterol, and high blood sugar.  Hypertension and Heart Disease Clinical staff conducted group or individual video education with verbal and written material and guidebook.  Patient  learns that high blood pressure, or hypertension, is very common in the [place] . Hypertension is largely due to excessive salt intake, but other important risk factors include being overweight, physical inactivity, drinking too much alcohol, smoking, and not eating enough potassium from fruits and vegetables. High blood pressure is a leading risk factor for heart attack, stroke, congestive heart failure, dementia, kidney failure, and premature death. Long-term effects of excessive salt intake include stiffening of the arteries and thickening of heart muscle and organ damage. Recommendations include ways to reduce hypertension and the risk of heart disease.  Diseases of Our Time - Focusing on Diabetes Clinical staff conducted group or individual video education with verbal and written material and guidebook.  Patient learns why the best way to stop diseases of our time is prevention, through food and other lifestyle changes. Medicine (such as prescription pills and surgeries) is often only a Band-Aid on the problem, not a long-term solution. Most common diseases of our time include obesity, type 2 diabetes, hypertension, heart disease, and cancer. The Pritikin Program is recommended and has been proven to help reduce, reverse, and/or prevent the damaging effects of metabolic syndrome.  Nutrition   Overview of the Pritikin Eating Plan  Clinical staff conducted group or individual video education with verbal and written material and guidebook.  Patient learns about the Pritikin Eating Plan for disease risk reduction. The Pritikin Eating Plan emphasizes a wide variety of unrefined, minimally-processed carbohydrates, like fruits, vegetables, whole grains, and legumes. Go, Caution, and  Stop food choices are explained. Plant-based and lean animal proteins are emphasized. Rationale provided for low sodium intake for blood pressure control, low added sugars for blood sugar stabilization, and low added fats and  oils for coronary artery disease risk reduction and weight management.  Calorie Density  Clinical staff conducted group or individual video education with verbal and written material and guidebook.  Patient learns about calorie density and how it impacts the Pritikin Eating Plan. Knowing the characteristics of the food you choose will help you decide whether those foods will lead to weight gain or weight loss, and whether you want to consume more or less of them. Weight loss is usually a side effect of the Pritikin Eating Plan because of its focus on low calorie-dense foods.  Label Reading  Clinical staff conducted group or individual video education with verbal and written material and guidebook.  Patient learns about the Pritikin recommended label reading guidelines and corresponding recommendations regarding calorie density, added sugars, sodium content, and whole grains.  Dining Out - Part 1  Clinical staff conducted group or individual video education with verbal and written material and guidebook.  Patient learns that restaurant meals can be sabotaging because they can be so high in calories, fat, sodium, and/or sugar. Patient learns recommended strategies on how to positively address this and avoid unhealthy pitfalls.  Facts on Fats  Clinical staff conducted group or individual video education with verbal and written material and guidebook.  Patient learns that lifestyle modifications can be just as effective, if not more so, as many medications for lowering your risk of heart disease. A Pritikin lifestyle can help to reduce your risk of inflammation and atherosclerosis (cholesterol build-up, or plaque, in the artery walls). Lifestyle interventions such as dietary choices and physical activity address the cause of atherosclerosis. A review of the types of fats and their impact on blood cholesterol levels, along with dietary recommendations to reduce fat intake is also included.  Nutrition  Action Plan  Clinical staff conducted group or individual video education with verbal and written material and guidebook.  Patient learns how to incorporate Pritikin recommendations into their lifestyle. Recommendations include planning and keeping personal health goals in mind as an important part of their success.  Healthy Mind-Set    Healthy Minds, Bodies, Hearts  Clinical staff conducted group or individual video education with verbal and written material and guidebook.  Patient learns how to identify when they are stressed. Video will discuss the impact of that stress, as well as the many benefits of stress management. Patient will also be introduced to stress management techniques. The way we think, act, and feel has an impact on our hearts.  How Our Thoughts Can Heal Our Hearts  Clinical staff conducted group or individual video education with verbal and written material and guidebook.  Patient learns that negative thoughts can cause depression and anxiety. This can result in negative lifestyle behavior and serious health problems. Cognitive behavioral therapy is an effective method to help control our thoughts in order to change and improve our emotional outlook.  Additional Videos:  Exercise    Improving Performance  Clinical staff conducted group or individual video education with verbal and written material and guidebook.  Patient learns to use a non-linear approach by alternating intensity levels and lengths of time spent exercising to help burn more calories and lose more body fat. Cardiovascular exercise helps improve heart health, metabolism, hormonal balance, blood sugar control, and recovery from fatigue. Resistance training improves  strength, endurance, balance, coordination, reaction time, metabolism, and muscle mass. Flexibility exercise improves circulation, posture, and balance. Seek guidance from your physician and exercise physiologist before implementing an exercise routine  and learn your capabilities and proper form for all exercise.  Introduction to Yoga  Clinical staff conducted group or individual video education with verbal and written material and guidebook.  Patient learns about yoga, a discipline of the coming together of mind, breath, and body. The benefits of yoga include improved flexibility, improved range of motion, better posture and core strength, increased lung function, weight loss, and positive self-image. Yoga's heart health benefits include lowered blood pressure, healthier heart rate, decreased cholesterol and triglyceride levels, improved immune function, and reduced stress. Seek guidance from your physician and exercise physiologist before implementing an exercise routine and learn your capabilities and proper form for all exercise.  Medical   Aging: Enhancing Your Quality of Life  Clinical staff conducted group or individual video education with verbal and written material and guidebook.  Patient learns key strategies and recommendations to stay in good physical health and enhance quality of life, such as prevention strategies, having an advocate, securing a Health Care Proxy and Power of Attorney, and keeping a list of medications and system for tracking them. It also discusses how to avoid risk for bone loss.  Biology of Weight Control  Clinical staff conducted group or individual video education with verbal and written material and guidebook.  Patient learns that weight gain occurs because we consume more calories than we burn (eating more, moving less). Even if your body weight is normal, you may have higher ratios of fat compared to muscle mass. Too much body fat puts you at increased risk for cardiovascular disease, heart attack, stroke, type 2 diabetes, and obesity-related cancers. In addition to exercise, following the Pritikin Eating Plan can help reduce your risk.  Decoding Lab Results  Clinical staff conducted group or individual video  education with verbal and written material and guidebook.  Patient learns that lab test reflects one measurement whose values change over time and are influenced by many factors, including medication, stress, sleep, exercise, food, hydration, pre-existing medical conditions, and more. It is recommended to use the knowledge from this video to become more involved with your lab results and evaluate your numbers to speak with your doctor.   Diseases of Our Time - Overview  Clinical staff conducted group or individual video education with verbal and written material and guidebook.  Patient learns that according to the CDC, 50% to 70% of chronic diseases (such as obesity, type 2 diabetes, elevated lipids, hypertension, and heart disease) are avoidable through lifestyle improvements including healthier food choices, listening to satiety cues, and increased physical activity.  Sleep Disorders Clinical staff conducted group or individual video education with verbal and written material and guidebook.  Patient learns how good quality and duration of sleep are important to overall health and well-being. Patient also learns about sleep disorders and how they impact health along with recommendations to address them, including discussing with a physician.  Nutrition  Dining Out - Part 2 Clinical staff conducted group or individual video education with verbal and written material and guidebook.  Patient learns how to plan ahead and communicate in order to maximize their dining experience in a healthy and nutritious manner. Included are recommended food choices based on the type of restaurant the patient is visiting.   Fueling a Banker conducted group or individual video education with  verbal and written material and guidebook.  There is a strong connection between our food choices and our health. Diseases like obesity and type 2 diabetes are very prevalent and are in large-part due to  lifestyle choices. The Pritikin Eating Plan provides plenty of food and hunger-curbing satisfaction. It is easy to follow, affordable, and helps reduce health risks.  Menu Workshop  Clinical staff conducted group or individual video education with verbal and written material and guidebook.  Patient learns that restaurant meals can sabotage health goals because they are often packed with calories, fat, sodium, and sugar. Recommendations include strategies to plan ahead and to communicate with the manager, chef, or server to help order a healthier meal.  Planning Your Eating Strategy  Clinical staff conducted group or individual video education with verbal and written material and guidebook.  Patient learns about the Pritikin Eating Plan and its benefit of reducing the risk of disease. The Pritikin Eating Plan does not focus on calories. Instead, it emphasizes high-quality, nutrient-rich foods. By knowing the characteristics of the foods, we choose, we can determine their calorie density and make informed decisions.  Targeting Your Nutrition Priorities  Clinical staff conducted group or individual video education with verbal and written material and guidebook.  Patient learns that lifestyle habits have a tremendous impact on disease risk and progression. This video provides eating and physical activity recommendations based on your personal health goals, such as reducing LDL cholesterol, losing weight, preventing or controlling type 2 diabetes, and reducing high blood pressure.  Vitamins and Minerals  Clinical staff conducted group or individual video education with verbal and written material and guidebook.  Patient learns different ways to obtain key vitamins and minerals, including through a recommended healthy diet. It is important to discuss all supplements you take with your doctor.   Healthy Mind-Set    Smoking Cessation  Clinical staff conducted group or individual video education with  verbal and written material and guidebook.  Patient learns that cigarette smoking and tobacco addiction pose a serious health risk which affects millions of people. Stopping smoking will significantly reduce the risk of heart disease, lung disease, and many forms of cancer. Recommended strategies for quitting are covered, including working with your doctor to develop a successful plan.  Culinary   Becoming a Set designer conducted group or individual video education with verbal and written material and guidebook.  Patient learns that cooking at home can be healthy, cost-effective, quick, and puts them in control. Keys to cooking healthy recipes will include looking at your recipe, assessing your equipment needs, planning ahead, making it simple, choosing cost-effective seasonal ingredients, and limiting the use of added fats, salts, and sugars.  Cooking - Breakfast and Snacks  Clinical staff conducted group or individual video education with verbal and written material and guidebook.  Patient learns how important breakfast is to satiety and nutrition through the entire day. Recommendations include key foods to eat during breakfast to help stabilize blood sugar levels and to prevent overeating at meals later in the day. Planning ahead is also a key component.  Cooking - Educational psychologist conducted group or individual video education with verbal and written material and guidebook.  Patient learns eating strategies to improve overall health, including an approach to cook more at home. Recommendations include thinking of animal protein as a side on your plate rather than center stage and focusing instead on lower calorie dense options like vegetables, fruits, whole grains, and  plant-based proteins, such as beans. Making sauces in large quantities to freeze for later and leaving the skin on your vegetables are also recommended to maximize your experience.  Cooking - Healthy  Salads and Dressing Clinical staff conducted group or individual video education with verbal and written material and guidebook.  Patient learns that vegetables, fruits, whole grains, and legumes are the foundations of the Pritikin Eating Plan. Recommendations include how to incorporate each of these in flavorful and healthy salads, and how to create homemade salad dressings. Proper handling of ingredients is also covered. Cooking - Soups and [employer] - Soups and Desserts Clinical staff conducted group or individual video education with verbal and written material and guidebook.  Patient learns that Pritikin soups and desserts make for easy, nutritious, and delicious snacks and meal components that are low in sodium, fat, sugar, and calorie density, while high in vitamins, minerals, and filling fiber. Recommendations include simple and healthy ideas for soups and desserts.   Overview     The Pritikin Solution Program Overview Clinical staff conducted group or individual video education with verbal and written material and guidebook.  Patient learns that the results of the Pritikin Program have been documented in more than 100 articles published in peer-reviewed journals, and the benefits include reducing risk factors for (and, in some cases, even reversing) high cholesterol, high blood pressure, type 2 diabetes, obesity, and more! An overview of the three key pillars of the Pritikin Program will be covered: eating well, doing regular exercise, and having a healthy mind-set.  WORKSHOPS  Exercise: Exercise Basics: Building Your Action Plan Clinical staff led group instruction and group discussion with PowerPoint presentation and patient guidebook. To enhance the learning environment the use of posters, models and videos may be added. At the conclusion of this workshop, patients will comprehend the difference between physical activity and exercise, as well as the benefits of  incorporating both, into their routine. Patients will understand the FITT (Frequency, Intensity, Time, and Type) principle and how to use it to build an exercise action plan. In addition, safety concerns and other considerations for exercise and cardiac rehab will be addressed by the presenter. The purpose of this lesson is to promote a comprehensive and effective weekly exercise routine in order to improve patients' overall level of fitness.   Managing Heart Disease: Your Path to a Healthier Heart Clinical staff led group instruction and group discussion with PowerPoint presentation and patient guidebook. To enhance the learning environment the use of posters, models and videos may be added.At the conclusion of this workshop, patients will understand the anatomy and physiology of the heart. Additionally, they will understand how Pritikin's three pillars impact the risk factors, the progression, and the management of heart disease.  The purpose of this lesson is to provide a high-level overview of the heart, heart disease, and how the Pritikin lifestyle positively impacts risk factors.  Exercise Biomechanics Clinical staff led group instruction and group discussion with PowerPoint presentation and patient guidebook. To enhance the learning environment the use of posters, models and videos may be added. Patients will learn how the structural parts of their bodies function and how these functions impact their daily activities, movement, and exercise. Patients will learn how to promote a neutral spine, learn how to manage pain, and identify ways to improve their physical movement in order to promote healthy living. The purpose of this lesson is to expose patients to common physical limitations that impact physical activity. Participants will learn  practical ways to adapt and manage aches and pains, and to minimize their effect on regular exercise. Patients will learn how to maintain good posture  while sitting, walking, and lifting.  Balance Training and Fall Prevention  Clinical staff led group instruction and group discussion with PowerPoint presentation and patient guidebook. To enhance the learning environment the use of posters, models and videos may be added. At the conclusion of this workshop, patients will understand the importance of their sensorimotor skills (vision, proprioception, and the vestibular system) in maintaining their ability to balance as they age. Patients will apply a variety of balancing exercises that are appropriate for their current level of function. Patients will understand the common causes for poor balance, possible solutions to these problems, and ways to modify their physical environment in order to minimize their fall risk. The purpose of this lesson is to teach patients about the importance of maintaining balance as they age and ways to minimize their risk of falling.  WORKSHOPS   Nutrition:  Fueling a Ship broker led group instruction and group discussion with PowerPoint presentation and patient guidebook. To enhance the learning environment the use of posters, models and videos may be added. Patients will review the foundational principles of the Pritikin Eating Plan and understand what constitutes a serving size in each of the food groups. Patients will also learn Pritikin-friendly foods that are better choices when away from home and review make-ahead meal and snack options. Calorie density will be reviewed and applied to three nutrition priorities: weight maintenance, weight loss, and weight gain. The purpose of this lesson is to reinforce (in a group setting) the key concepts around what patients are recommended to eat and how to apply these guidelines when away from home by planning and selecting Pritikin-friendly options. Patients will understand how calorie density may be adjusted for different weight management goals.  Mindful  Eating  Clinical staff led group instruction and group discussion with PowerPoint presentation and patient guidebook. To enhance the learning environment the use of posters, models and videos may be added. Patients will briefly review the concepts of the Pritikin Eating Plan and the importance of low-calorie dense foods. The concept of mindful eating will be introduced as well as the importance of paying attention to internal hunger signals. Triggers for non-hunger eating and techniques for dealing with triggers will be explored. The purpose of this lesson is to provide patients with the opportunity to review the basic principles of the Pritikin Eating Plan, discuss the value of eating mindfully and how to measure internal cues of hunger and fullness using the Hunger Scale. Patients will also discuss reasons for non-hunger eating and learn strategies to use for controlling emotional eating.  Targeting Your Nutrition Priorities Clinical staff led group instruction and group discussion with PowerPoint presentation and patient guidebook. To enhance the learning environment the use of posters, models and videos may be added. Patients will learn how to determine their genetic susceptibility to disease by reviewing their family history. Patients will gain insight into the importance of diet as part of an overall healthy lifestyle in mitigating the impact of genetics and other environmental insults. The purpose of this lesson is to provide patients with the opportunity to assess their personal nutrition priorities by looking at their family history, their own health history and current risk factors. Patients will also be able to discuss ways of prioritizing and modifying the Pritikin Eating Plan for their highest risk areas  Menu  Clinical staff  led group instruction and group discussion with PowerPoint presentation and patient guidebook. To enhance the learning environment the use of posters, models and videos may  be added. Using menus brought in from [employer], or printed from [employer], patients will apply the Pritikin dining out guidelines that were presented in the [employer] video. Patients will also be able to practice these guidelines in a variety of provided scenarios. The purpose of this lesson is to provide patients with the opportunity to practice hands-on learning of the Pritikin Dining Out guidelines with actual menus and practice scenarios.  Label Reading Clinical staff led group instruction and group discussion with PowerPoint presentation and patient guidebook. To enhance the learning environment the use of posters, models and videos may be added. Patients will review and discuss the Pritikin label reading guidelines presented in Pritikin's Label Reading Educational series video. Using fool labels brought in from local grocery stores and markets, patients will apply the label reading guidelines and determine if the packaged food meet the Pritikin guidelines. The purpose of this lesson is to provide patients with the opportunity to review, discuss, and practice hands-on learning of the Pritikin Label Reading guidelines with actual packaged food labels. Cooking School  Pritikin's [employer] are designed to teach patients ways to prepare quick, simple, and affordable recipes at home. The importance of nutrition's role in chronic disease risk reduction is reflected in its emphasis in the overall Pritikin program. By learning how to prepare essential core Pritikin Eating Plan recipes, patients will increase control over what they eat; be able to customize the flavor of foods without the use of added salt, sugar, or fat; and improve the quality of the food they consume. By learning a set of core recipes which are easily assembled, quickly prepared, and affordable, patients are more likely to prepare more healthy foods at home. These workshops focus on convenient  breakfasts, simple entres, side dishes, and desserts which can be prepared with minimal effort and are consistent with nutrition recommendations for cardiovascular risk reduction. Cooking [employer] are taught by a Armed forces logistics/support/administrative officer (RD) who has been trained by the [employer]. The chef or RD has a clear understanding of the importance of minimizing - if not completely eliminating - added fat, sugar, and sodium in recipes. Throughout the series of Cooking School Workshop sessions, patients will learn about healthy ingredients and efficient methods of cooking to build confidence in their capability to prepare    Cooking School weekly topics:  Adding Flavor- Sodium-Free  Fast and Healthy Breakfasts  Powerhouse Plant-Based Proteins  Satisfying Salads and Dressings  Simple Sides and Sauces  International Cuisine-Spotlight on the [employer] Zones  Delicious Desserts  Savory Soups  [employer] - Meals in a Astronomer Appetizers and Snacks  Comforting Weekend Breakfasts  One-Pot Wonders   Fast Evening Meals  Landscape architect Your Pritikin Plate  WORKSHOPS   Healthy Mindset (Psychosocial):  Focused Goals, Sustainable Changes Clinical staff led group instruction and group discussion with PowerPoint presentation and patient guidebook. To enhance the learning environment the use of posters, models and videos may be added. Patients will be able to apply effective goal setting strategies to establish at least one personal goal, and then take consistent, meaningful action toward that goal. They will learn to identify common barriers to achieving personal goals and develop strategies to overcome them. Patients will also gain an understanding of how our mind-set can impact our  ability to achieve goals and the importance of cultivating a positive and growth-oriented mind-set. The purpose of this lesson is to provide patients with a deeper understanding of  how to set and achieve personal goals, as well as the tools and strategies needed to overcome common obstacles which may arise along the way.  From Head to Heart: The Power of a Healthy Outlook  Clinical staff led group instruction and group discussion with PowerPoint presentation and patient guidebook. To enhance the learning environment the use of posters, models and videos may be added. Patients will be able to recognize and describe the impact of emotions and mood on physical health. They will discover the importance of self-care and explore self-care practices which may work for them. Patients will also learn how to utilize the 4 C's to cultivate a healthier outlook and better manage stress and challenges. The purpose of this lesson is to demonstrate to patients how a healthy outlook is an essential part of maintaining good health, especially as they continue their cardiac rehab journey.  Healthy Sleep for a Healthy Heart Clinical staff led group instruction and group discussion with PowerPoint presentation and patient guidebook. To enhance the learning environment the use of posters, models and videos may be added. At the conclusion of this workshop, patients will be able to demonstrate knowledge of the importance of sleep to overall health, well-being, and quality of life. They will understand the symptoms of, and treatments for, common sleep disorders. Patients will also be able to identify daytime and nighttime behaviors which impact sleep, and they will be able to apply these tools to help manage sleep-related challenges. The purpose of this lesson is to provide patients with a general overview of sleep and outline the importance of quality sleep. Patients will learn about a few of the most common sleep disorders. Patients will also be introduced to the concept of "sleep hygiene," and discover ways to self-manage certain sleeping problems through simple daily behavior changes. Finally, the workshop  will motivate patients by clarifying the links between quality sleep and their goals of heart-healthy living.   Recognizing and Reducing Stress Clinical staff led group instruction and group discussion with PowerPoint presentation and patient guidebook. To enhance the learning environment the use of posters, models and videos may be added. At the conclusion of this workshop, patients will be able to understand the types of stress reactions, differentiate between acute and chronic stress, and recognize the impact that chronic stress has on their health. They will also be able to apply different coping mechanisms, such as reframing negative self-talk. Patients will have the opportunity to practice a variety of stress management techniques, such as deep abdominal breathing, progressive muscle relaxation, and/or guided imagery.  The purpose of this lesson is to educate patients on the role of stress in their lives and to provide healthy techniques for coping with it.  Learning Barriers/Preferences:  Learning Barriers/Preferences - 02/24/24 1536       Learning Barriers/Preferences   Learning Preferences Computer/Internet;Group Instruction;Individual Instruction;Pictoral;Video;Written Material;Verbal Instruction;Skilled Demonstration;Audio          Education Topics:  Knowledge Questionnaire Score:  Knowledge Questionnaire Score - 02/24/24 1537       Knowledge Questionnaire Score   Pre Score 22/24          Core Components/Risk Factors/Patient Goals at Admission:  Personal Goals and Risk Factors at Admission - 02/24/24 1545       Core Components/Risk Factors/Patient Goals on Admission   Lipids Yes  Intervention Provide education and support for participant on nutrition & aerobic/resistive exercise along with prescribed medications to achieve LDL 70mg , HDL >40mg .    Expected Outcomes Short Term: Participant states understanding of desired cholesterol values and is compliant with  medications prescribed. Participant is following exercise prescription and nutrition guidelines.;Long Term: Cholesterol controlled with medications as prescribed, with individualized exercise RX and with personalized nutrition plan. Value goals: LDL < 70mg , HDL > 40 mg.    Personal Goal Other Yes    Personal Goal Short: get into Ex routine, Long term: increase strength and stamina    Intervention Will continue to monitor pt and progress workloads as tolerated without sign or symptom    Expected Outcomes Pt will achieve his goals          Core Components/Risk Factors/Patient Goals Review:   Goals and Risk Factor Review     Row Name 02/29/24 0816 03/07/24 1225           Core Components/Risk Factors/Patient Goals Review   Personal Goals Review Weight Management/Obesity;Lipids Weight Management/Obesity;Lipids      Review [name] started cardiac rehab on 02/28/24. [name] did well with exercise. vital signs were stable. [name] started cardiac rehab on 02/28/24. [name] is off to a great start with exercise. vital signs  have been  stable.      Expected Outcomes [name] will continue to participate in cardiac rehab for exercise, nutriton and lifestyle modifications [name] will continue to participate in cardiac rehab for exercise, nutriton and lifestyle modifications         Core Components/Risk Factors/Patient Goals at Discharge (Final Review):   Goals and Risk Factor Review - 03/07/24 1225       Core Components/Risk Factors/Patient Goals Review   Personal Goals Review Weight Management/Obesity;Lipids    Review [name] started cardiac rehab on 02/28/24. [name] is off to a great start with exercise. vital signs  have been  stable.    Expected Outcomes [name] will continue to participate in cardiac rehab for exercise, nutriton and lifestyle modifications          ITP Comments:  ITP Comments     Row Name 02/29/24 0810 03/07/24 1222         ITP Comments 30 Day ITP Review. [name] started cardiac rehab on  02/28/24. [name] did well with exercise. 30 Day ITP Review. [name] started cardiac rehab on 02/28/24. [name] is off to a good start with exercise.         Comments: See ITP comments.Hadassah Elpidio Quan RN BSN

## 2024-03-08 ENCOUNTER — Encounter (HOSPITAL_COMMUNITY)
Admission: RE | Admit: 2024-03-08 | Discharge: 2024-03-08 | Disposition: A | Discharge status: Home / Self Care | Source: Ambulatory Visit | Attending: Internal Medicine

## 2024-03-08 DIAGNOSIS — I252 Old myocardial infarction: Secondary | ICD-10-CM | POA: Diagnosis not present

## 2024-03-08 DIAGNOSIS — Z955 Presence of coronary angioplasty implant and graft: Secondary | ICD-10-CM | POA: Diagnosis not present

## 2024-03-08 DIAGNOSIS — I213 ST elevation (STEMI) myocardial infarction of unspecified site: Secondary | ICD-10-CM

## 2024-03-08 DIAGNOSIS — Z48812 Encounter for surgical aftercare following surgery on the circulatory system: Secondary | ICD-10-CM | POA: Diagnosis not present

## 2024-03-10 ENCOUNTER — Encounter (HOSPITAL_COMMUNITY)
Admission: RE | Admit: 2024-03-10 | Discharge: 2024-03-10 | Disposition: A | Discharge status: Home / Self Care | Source: Ambulatory Visit | Attending: Internal Medicine | Admitting: Internal Medicine

## 2024-03-10 DIAGNOSIS — Z48812 Encounter for surgical aftercare following surgery on the circulatory system: Secondary | ICD-10-CM | POA: Diagnosis not present

## 2024-03-10 DIAGNOSIS — Z955 Presence of coronary angioplasty implant and graft: Secondary | ICD-10-CM | POA: Diagnosis not present

## 2024-03-10 DIAGNOSIS — I213 ST elevation (STEMI) myocardial infarction of unspecified site: Secondary | ICD-10-CM

## 2024-03-10 DIAGNOSIS — I252 Old myocardial infarction: Secondary | ICD-10-CM | POA: Diagnosis not present

## 2024-03-13 ENCOUNTER — Encounter (HOSPITAL_COMMUNITY)
Admission: RE | Admit: 2024-03-13 | Discharge: 2024-03-13 | Disposition: A | Discharge status: Home / Self Care | Source: Ambulatory Visit | Attending: Internal Medicine | Admitting: Internal Medicine

## 2024-03-13 DIAGNOSIS — I213 ST elevation (STEMI) myocardial infarction of unspecified site: Secondary | ICD-10-CM

## 2024-03-13 DIAGNOSIS — I252 Old myocardial infarction: Secondary | ICD-10-CM | POA: Diagnosis not present

## 2024-03-13 DIAGNOSIS — Z955 Presence of coronary angioplasty implant and graft: Secondary | ICD-10-CM | POA: Diagnosis not present

## 2024-03-13 DIAGNOSIS — Z48812 Encounter for surgical aftercare following surgery on the circulatory system: Secondary | ICD-10-CM | POA: Diagnosis not present

## 2024-03-15 ENCOUNTER — Other Ambulatory Visit (HOSPITAL_COMMUNITY): Payer: Self-pay

## 2024-03-15 ENCOUNTER — Encounter (HOSPITAL_COMMUNITY)
Admission: RE | Admit: 2024-03-15 | Discharge: 2024-03-15 | Disposition: A | Discharge status: Home / Self Care | Source: Ambulatory Visit | Attending: Internal Medicine

## 2024-03-15 DIAGNOSIS — I252 Old myocardial infarction: Secondary | ICD-10-CM | POA: Diagnosis not present

## 2024-03-15 DIAGNOSIS — I213 ST elevation (STEMI) myocardial infarction of unspecified site: Secondary | ICD-10-CM

## 2024-03-15 DIAGNOSIS — Z955 Presence of coronary angioplasty implant and graft: Secondary | ICD-10-CM | POA: Diagnosis not present

## 2024-03-15 DIAGNOSIS — Z48812 Encounter for surgical aftercare following surgery on the circulatory system: Secondary | ICD-10-CM | POA: Diagnosis not present

## 2024-03-17 ENCOUNTER — Encounter (HOSPITAL_COMMUNITY)
Admission: RE | Admit: 2024-03-17 | Discharge: 2024-03-17 | Disposition: A | Discharge status: Home / Self Care | Source: Ambulatory Visit | Attending: Internal Medicine | Admitting: Internal Medicine

## 2024-03-17 DIAGNOSIS — I213 ST elevation (STEMI) myocardial infarction of unspecified site: Secondary | ICD-10-CM

## 2024-03-17 DIAGNOSIS — I252 Old myocardial infarction: Secondary | ICD-10-CM | POA: Diagnosis not present

## 2024-03-17 DIAGNOSIS — Z48812 Encounter for surgical aftercare following surgery on the circulatory system: Secondary | ICD-10-CM | POA: Diagnosis not present

## 2024-03-17 DIAGNOSIS — Z955 Presence of coronary angioplasty implant and graft: Secondary | ICD-10-CM

## 2024-03-20 ENCOUNTER — Encounter (HOSPITAL_COMMUNITY): Admission: RE | Admit: 2024-03-20 | Source: Ambulatory Visit

## 2024-03-20 ENCOUNTER — Telehealth (HOSPITAL_COMMUNITY): Payer: Self-pay

## 2024-03-20 ENCOUNTER — Telehealth (HOSPITAL_COMMUNITY): Payer: Self-pay | Admitting: *Deleted

## 2024-03-20 ENCOUNTER — Encounter: Payer: Self-pay | Admitting: Internal Medicine

## 2024-03-20 NOTE — Telephone Encounter (Signed)
 I don't think the fever is from the stent.  I would go down the PCP route.  Thanks. Thukkani, Arun K, MD 03/20/24  2:57 PM

## 2024-03-20 NOTE — Telephone Encounter (Signed)
 Spoke with Peter [name] has had a fever over the weekend tested for COVID his test was negative. Peter [name] says he has also reached out to his primary care provider Dr Geofm. Hadassah Elpidio Quan RN BSN

## 2024-03-20 NOTE — Telephone Encounter (Signed)
 Patient c/o for 12:30 CR class due to fever, says he's had it all weekend. Advised patient he must be 48-hour symptom free before returning to class.

## 2024-03-22 ENCOUNTER — Encounter (HOSPITAL_COMMUNITY)
Admission: RE | Admit: 2024-03-22 | Discharge: 2024-03-22 | Disposition: A | Discharge status: Home / Self Care | Source: Ambulatory Visit | Attending: Internal Medicine

## 2024-03-22 DIAGNOSIS — Z48812 Encounter for surgical aftercare following surgery on the circulatory system: Secondary | ICD-10-CM | POA: Diagnosis not present

## 2024-03-22 DIAGNOSIS — Z955 Presence of coronary angioplasty implant and graft: Secondary | ICD-10-CM

## 2024-03-22 DIAGNOSIS — I213 ST elevation (STEMI) myocardial infarction of unspecified site: Secondary | ICD-10-CM

## 2024-03-22 DIAGNOSIS — I252 Old myocardial infarction: Secondary | ICD-10-CM | POA: Diagnosis not present

## 2024-03-24 ENCOUNTER — Encounter (HOSPITAL_COMMUNITY)
Admission: RE | Admit: 2024-03-24 | Discharge: 2024-03-24 | Disposition: A | Discharge status: Home / Self Care | Source: Ambulatory Visit | Attending: Internal Medicine | Admitting: Internal Medicine

## 2024-03-24 DIAGNOSIS — Z48812 Encounter for surgical aftercare following surgery on the circulatory system: Secondary | ICD-10-CM | POA: Diagnosis not present

## 2024-03-24 DIAGNOSIS — I252 Old myocardial infarction: Secondary | ICD-10-CM | POA: Insufficient documentation

## 2024-03-24 DIAGNOSIS — Z955 Presence of coronary angioplasty implant and graft: Secondary | ICD-10-CM | POA: Insufficient documentation

## 2024-03-24 DIAGNOSIS — I213 ST elevation (STEMI) myocardial infarction of unspecified site: Secondary | ICD-10-CM

## 2024-03-24 NOTE — Progress Notes (Signed)
 Reviewed home exercise Rx with patient today.  Encouraged warm-up, cool-down, and stretching. Reviewed THRR of 65-131 and keeping RPE between 11-13. Encouraged to hydrate with activity.  Reviewed weather parameters for temperature and humidity for safe exercise outdoors. Reviewed S/S to terminate exercise and when to call 911 vs MD. Reviewed the use of NTG and pt was encouraged to carry at all times. Pt encouraged to always carry a cell phone for safety when exercising outdoors. Pt verbalized understanding of the home exercise Rx and was provided a copy.

## 2024-03-27 ENCOUNTER — Encounter (HOSPITAL_COMMUNITY)
Admission: RE | Admit: 2024-03-27 | Discharge: 2024-03-27 | Disposition: A | Discharge status: Home / Self Care | Source: Ambulatory Visit | Attending: Internal Medicine

## 2024-03-27 DIAGNOSIS — Z48812 Encounter for surgical aftercare following surgery on the circulatory system: Secondary | ICD-10-CM | POA: Diagnosis not present

## 2024-03-27 DIAGNOSIS — Z955 Presence of coronary angioplasty implant and graft: Secondary | ICD-10-CM

## 2024-03-27 DIAGNOSIS — I252 Old myocardial infarction: Secondary | ICD-10-CM | POA: Diagnosis not present

## 2024-03-27 DIAGNOSIS — I213 ST elevation (STEMI) myocardial infarction of unspecified site: Secondary | ICD-10-CM

## 2024-03-29 ENCOUNTER — Encounter (HOSPITAL_COMMUNITY)
Admission: RE | Admit: 2024-03-29 | Discharge: 2024-03-29 | Disposition: A | Discharge status: Home / Self Care | Source: Ambulatory Visit | Attending: Internal Medicine

## 2024-03-29 DIAGNOSIS — I213 ST elevation (STEMI) myocardial infarction of unspecified site: Secondary | ICD-10-CM

## 2024-03-29 DIAGNOSIS — I252 Old myocardial infarction: Secondary | ICD-10-CM | POA: Diagnosis not present

## 2024-03-29 DIAGNOSIS — Z48812 Encounter for surgical aftercare following surgery on the circulatory system: Secondary | ICD-10-CM | POA: Diagnosis not present

## 2024-03-29 DIAGNOSIS — Z955 Presence of coronary angioplasty implant and graft: Secondary | ICD-10-CM

## 2024-03-29 NOTE — Progress Notes (Signed)
 Patient Peter [name] is here for a follow-up appointment today for: Secondary and unspecified malignant neoplasm of lymph nodes of head, face and neck.    Pain issues, if any: Hoarseness when talking alot Using a feeding tube?: None Weight changes, if any: Weight changes Swallowing issues, if any: None Smoking or chewing tobacco? None Using fluoride  toothpaste daily? Yes Last ENT visit was on: May, 2025 Other notable issues, if any:  Developed a little knot on tongue during March went to his ENT to have it evaluated   BP 117/73 (BP Location: Left Arm, Patient Position: Sitting, Cuff Size: Normal)   Pulse 70   Temp 97.8 F (36.6 C)   Resp 18   Ht 5' 8 (1.727 m)   Wt 160 lb 9.6 oz (72.8 kg)   SpO2 100%   BMI 24.42 kg/m     Wt Readings from Last 3 Encounters:  04/04/24 160 lb 9.6 oz (72.8 kg)  02/24/24 159 lb 13.3 oz (72.5 kg)  02/16/24 159 lb (72.1 kg)

## 2024-03-31 ENCOUNTER — Encounter (HOSPITAL_COMMUNITY)
Admission: RE | Admit: 2024-03-31 | Discharge: 2024-03-31 | Disposition: A | Discharge status: Home / Self Care | Source: Ambulatory Visit | Attending: Internal Medicine | Admitting: Internal Medicine

## 2024-03-31 DIAGNOSIS — I252 Old myocardial infarction: Secondary | ICD-10-CM | POA: Diagnosis not present

## 2024-03-31 DIAGNOSIS — Z955 Presence of coronary angioplasty implant and graft: Secondary | ICD-10-CM | POA: Diagnosis not present

## 2024-03-31 DIAGNOSIS — Z48812 Encounter for surgical aftercare following surgery on the circulatory system: Secondary | ICD-10-CM | POA: Diagnosis not present

## 2024-03-31 DIAGNOSIS — I213 ST elevation (STEMI) myocardial infarction of unspecified site: Secondary | ICD-10-CM

## 2024-03-31 NOTE — Progress Notes (Signed)
 Radiation Oncology         [phone] ________________________________  Name: Peter [name] MRN: [id number]  Date: 04/04/2024  DOB: [birth year]  Follow-Up Visit Note  CC: Geofm Glade PARAS, MD  Geofm Glade PARAS, MD  Diagnosis and Prior Radiotherapy:       ICD-10-CM   1. 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 Head and neck cancer (HCC)  C76.0         Cancer Staging  Head and neck cancer (HCC) Staging form: Pharynx - HPV-Mediated Oropharynx, AJCC 8th Edition - Clinical stage from 04/07/2023: Stage I (cT0, cN1, cM0, p16+) - Unsigned Stage prefix: Initial diagnosis  First Treatment Date: 2023-05-13 - Last Treatment Date: 2023-06-30   Plan Name: HN_Orop Site: Oropharynx Technique: IMRT Mode: Photon Dose Per Fraction: 2 Gy Prescribed Dose (Delivered / Prescribed): 70 Gy / 70 Gy Prescribed Fxs (Delivered / Prescribed): 35 / 35     CHIEF COMPLAINT:  Here for follow-up and surveillance of neck cancer; s/p radiation  Narrative:  The patient returns today for routine follow-up.  He was last seen in our office 6 months ago.  He recently had an MI but is recovering well.  He was last seen by Dr. Carlie on 01/25/2024.  At that time he reported a persistent knot on his tongue.  Per Dr. Carlie, there was no evidence of mass or ulceration on the tongue or tongue base.  He recommended proceeding with continued surveillance.  Pain issues, if any: Hoarseness when talking alot Using a feeding tube?: None Weight changes, if any:  Wt Readings from Last 3 Encounters:  04/04/24 160 lb 9.6 oz (72.8 kg)  02/24/24 159 lb 13.3 oz (72.5 kg)  02/16/24 159 lb (72.1 kg)    Swallowing issues, if any: None Smoking or chewing tobacco? None Using fluoride  toothpaste daily? Yes Last ENT visit was on: May, 2025           ALLERGIES:  has no known allergies.  Meds: Current Outpatient Medications  Medication Sig Dispense Refill   aspirin  81 MG chewable tablet Chew 1  tablet (81 mg total) by mouth daily. 90 tablet 2   atorvastatin  (LIPITOR ) 80 MG tablet Take 1 tablet (80 mg total) by mouth daily. 90 tablet 1   EPINEPHrine (PRIMATENE MIST) 0.125 MG/ACT AERO Inhale 1 puff into the lungs as needed (SOB/Asthma).     famotidine (PEPCID) 20 MG tablet Take 20 mg by mouth daily.     metoprolol  succinate (TOPROL -XL) 25 MG 24 hr tablet Take 1 tablet (25 mg total) by mouth daily. 90 tablet 1   nitroGLYCERIN  (NITROSTAT ) 0.4 MG SL tablet Place 1 tablet (0.4 mg total) under the tongue every 5 (five) minutes as needed for chest pain. 25 tablet 2   prasugrel  (EFFIENT ) 10 MG TABS tablet Take 1 tablet (10 mg total) by mouth daily. 90 tablet 2   sodium fluoride  (FLUORISHIELD) 1.1 % GEL dental gel Place 1 Application onto teeth at bedtime.     No current facility-administered medications for this encounter.    Physical Findings:    The patient is in no acute distress. Patient is alert and oriented. Wt Readings from Last 3 Encounters:  04/04/24 160 lb 9.6 oz (72.8 kg)  02/24/24 159 lb 13.3 oz (72.5 kg)  02/16/24 159 lb (72.1 kg)    height is 5' 8 (1.727 m) and weight is 160 lb 9.6 oz (72.8 kg). His temperature is 97.8 F (  36.6 C). His blood pressure is 117/73 and his pulse is 70. His respiration is 18 and oxygen saturation is 100%. .  General: Alert and oriented, in no acute distress HEENT: Head is normocephalic. Extraocular movements are intact. Oropharynx is clear, oral cavity clear Neck: Neck is notable for no masses. Mild edema at submental region Heart RRR Chest CTAB Abd soft NT ND Skin: Skin in treatment fields shows satisfactory healing over neck Extremities: No cyanosis or edema. Lymphatics: see Neck Exam. No palpable axillary adenopathy.  Psychiatric: Judgment and insight are intact. Affect is appropriate.  PROCEDURE NOTE: After obtaining consent and spraying nasal cavity with topical oxymetazoline and lidocaine , the flexible endoscope was coated with  lidocaine  gel and introduced and passed through the nasal cavity.  The nasopharynx, oropharynx, hypopharynx, and larynx were then examined. No lesions appreciated in the mucosal axis. The true cords were symmetrically mobile. The patient tolerated the procedure well.     Lab Findings: Lab Results  Component Value Date   WBC 7.3 02/08/2024   HGB 13.7 02/08/2024   HCT 41.6 02/08/2024   MCV 96 02/08/2024   PLT 301 02/08/2024    Lab Results  Component Value Date   TSH 2.87 03/25/2023    Radiographic Findings: No results found.   Impression/Plan:    1) Head and Neck Cancer Status: Patient is doing well overall. NED per laryngoscopy / PE  2) Nutritional Status: no issues  3) Risk Factors:  Patient is a never smoker.   4) Swallowing: continue SLP exercises.    5) Dental: Encouraged to continue regular followup with dentistry, and dental hygiene including fluoride  treatments as tolerated. He states he is following closely with his dentist.    6) Thyroid function: Checking annually, results pending today Lab Results  Component Value Date   TSH 2.87 03/25/2023   7) Mild lymphedema: PT has helped  7) Patient will follow-up with restaging CT scans in Dec.  I told him he can see Dr. Carlie in April 2026 to stagger appts.  Our navigator will help coordinate CT scans w/ rad onc f/u   On date of service, in total, we spent 30 minutes on this encounter. Patient was seen in person. Note signed after encounter date; minutes pertain to date of service, only.     -----------------------------------  Lauraine Golden, MD  Direct Dial: [phone]  Fax: [phone] [email or web address]

## 2024-04-03 ENCOUNTER — Encounter (HOSPITAL_COMMUNITY)
Admission: RE | Admit: 2024-04-03 | Discharge: 2024-04-03 | Disposition: A | Discharge status: Home / Self Care | Source: Ambulatory Visit | Attending: Internal Medicine

## 2024-04-03 DIAGNOSIS — Z955 Presence of coronary angioplasty implant and graft: Secondary | ICD-10-CM | POA: Diagnosis not present

## 2024-04-03 DIAGNOSIS — Z48812 Encounter for surgical aftercare following surgery on the circulatory system: Secondary | ICD-10-CM | POA: Diagnosis not present

## 2024-04-03 DIAGNOSIS — I252 Old myocardial infarction: Secondary | ICD-10-CM | POA: Diagnosis not present

## 2024-04-03 DIAGNOSIS — I213 ST elevation (STEMI) myocardial infarction of unspecified site: Secondary | ICD-10-CM

## 2024-04-04 ENCOUNTER — Ambulatory Visit
Admission: RE | Admit: 2024-04-04 | Discharge: 2024-04-04 | Disposition: A | Discharge status: Home / Self Care | Source: Ambulatory Visit | Attending: Radiation Oncology | Admitting: Radiation Oncology

## 2024-04-04 ENCOUNTER — Ambulatory Visit
Admission: RE | Admit: 2024-04-04 | Discharge: 2024-04-04 | Disposition: A | Discharge status: Home / Self Care | Payer: BC Managed Care – PPO | Source: Ambulatory Visit | Attending: Radiation Oncology | Admitting: Radiation Oncology

## 2024-04-04 ENCOUNTER — Other Ambulatory Visit: Payer: Self-pay

## 2024-04-04 VITALS — BP 117/73 | HR 70 | Temp 97.8°F | Resp 18 | Ht 68.0 in | Wt 160.6 lb

## 2024-04-04 DIAGNOSIS — C801 Malignant (primary) neoplasm, unspecified: Secondary | ICD-10-CM | POA: Insufficient documentation

## 2024-04-04 DIAGNOSIS — Z08 Encounter for follow-up examination after completed treatment for malignant neoplasm: Secondary | ICD-10-CM | POA: Insufficient documentation

## 2024-04-04 DIAGNOSIS — C770 Secondary and unspecified malignant neoplasm of lymph nodes of head, face and neck: Secondary | ICD-10-CM

## 2024-04-04 DIAGNOSIS — C779 Secondary and unspecified malignant neoplasm of lymph node, unspecified: Secondary | ICD-10-CM | POA: Insufficient documentation

## 2024-04-04 DIAGNOSIS — Z8589 Personal history of malignant neoplasm of other organs and systems: Secondary | ICD-10-CM | POA: Diagnosis not present

## 2024-04-04 DIAGNOSIS — I890 Lymphedema, not elsewhere classified: Secondary | ICD-10-CM | POA: Insufficient documentation

## 2024-04-04 DIAGNOSIS — C760 Malignant neoplasm of head, face and neck: Secondary | ICD-10-CM

## 2024-04-04 DIAGNOSIS — Z923 Personal history of irradiation: Secondary | ICD-10-CM | POA: Diagnosis not present

## 2024-04-05 ENCOUNTER — Encounter (HOSPITAL_COMMUNITY)
Admission: RE | Admit: 2024-04-05 | Discharge: 2024-04-05 | Disposition: A | Discharge status: Home / Self Care | Source: Ambulatory Visit | Attending: Internal Medicine | Admitting: Internal Medicine

## 2024-04-05 ENCOUNTER — Other Ambulatory Visit: Payer: Self-pay

## 2024-04-05 DIAGNOSIS — C779 Secondary and unspecified malignant neoplasm of lymph node, unspecified: Secondary | ICD-10-CM

## 2024-04-05 DIAGNOSIS — Z955 Presence of coronary angioplasty implant and graft: Secondary | ICD-10-CM

## 2024-04-05 DIAGNOSIS — C801 Malignant (primary) neoplasm, unspecified: Secondary | ICD-10-CM

## 2024-04-05 DIAGNOSIS — I252 Old myocardial infarction: Secondary | ICD-10-CM | POA: Diagnosis not present

## 2024-04-05 DIAGNOSIS — Z48812 Encounter for surgical aftercare following surgery on the circulatory system: Secondary | ICD-10-CM | POA: Diagnosis not present

## 2024-04-05 DIAGNOSIS — I213 ST elevation (STEMI) myocardial infarction of unspecified site: Secondary | ICD-10-CM

## 2024-04-05 DIAGNOSIS — C770 Secondary and unspecified malignant neoplasm of lymph nodes of head, face and neck: Secondary | ICD-10-CM

## 2024-04-05 LAB — TSH: TSH: 3.92 u[IU]/mL (ref 0.350–4.500)

## 2024-04-07 ENCOUNTER — Encounter (HOSPITAL_COMMUNITY)
Admission: RE | Admit: 2024-04-07 | Discharge: 2024-04-07 | Disposition: A | Discharge status: Home / Self Care | Source: Ambulatory Visit | Attending: Internal Medicine | Admitting: Internal Medicine

## 2024-04-07 DIAGNOSIS — I213 ST elevation (STEMI) myocardial infarction of unspecified site: Secondary | ICD-10-CM

## 2024-04-07 DIAGNOSIS — Z955 Presence of coronary angioplasty implant and graft: Secondary | ICD-10-CM

## 2024-04-07 DIAGNOSIS — Z48812 Encounter for surgical aftercare following surgery on the circulatory system: Secondary | ICD-10-CM | POA: Diagnosis not present

## 2024-04-07 DIAGNOSIS — I252 Old myocardial infarction: Secondary | ICD-10-CM | POA: Diagnosis not present

## 2024-04-10 ENCOUNTER — Encounter (HOSPITAL_COMMUNITY)
Admission: RE | Admit: 2024-04-10 | Discharge: 2024-04-10 | Disposition: A | Discharge status: Home / Self Care | Source: Ambulatory Visit | Attending: Internal Medicine

## 2024-04-10 DIAGNOSIS — Z955 Presence of coronary angioplasty implant and graft: Secondary | ICD-10-CM | POA: Diagnosis not present

## 2024-04-10 DIAGNOSIS — Z48812 Encounter for surgical aftercare following surgery on the circulatory system: Secondary | ICD-10-CM | POA: Diagnosis not present

## 2024-04-10 DIAGNOSIS — I252 Old myocardial infarction: Secondary | ICD-10-CM | POA: Diagnosis not present

## 2024-04-10 DIAGNOSIS — I213 ST elevation (STEMI) myocardial infarction of unspecified site: Secondary | ICD-10-CM

## 2024-04-12 ENCOUNTER — Encounter (HOSPITAL_COMMUNITY)

## 2024-04-12 DIAGNOSIS — L219 Seborrheic dermatitis, unspecified: Secondary | ICD-10-CM | POA: Diagnosis not present

## 2024-04-12 DIAGNOSIS — D229 Melanocytic nevi, unspecified: Secondary | ICD-10-CM | POA: Diagnosis not present

## 2024-04-12 DIAGNOSIS — Z1283 Encounter for screening for malignant neoplasm of skin: Secondary | ICD-10-CM | POA: Diagnosis not present

## 2024-04-14 ENCOUNTER — Encounter (HOSPITAL_COMMUNITY)
Admission: RE | Admit: 2024-04-14 | Discharge: 2024-04-14 | Disposition: A | Discharge status: Home / Self Care | Source: Ambulatory Visit | Attending: Internal Medicine | Admitting: Internal Medicine

## 2024-04-14 DIAGNOSIS — Z955 Presence of coronary angioplasty implant and graft: Secondary | ICD-10-CM | POA: Diagnosis not present

## 2024-04-14 DIAGNOSIS — I252 Old myocardial infarction: Secondary | ICD-10-CM | POA: Diagnosis not present

## 2024-04-14 DIAGNOSIS — I213 ST elevation (STEMI) myocardial infarction of unspecified site: Secondary | ICD-10-CM

## 2024-04-14 DIAGNOSIS — Z48812 Encounter for surgical aftercare following surgery on the circulatory system: Secondary | ICD-10-CM | POA: Diagnosis not present

## 2024-04-17 ENCOUNTER — Encounter (HOSPITAL_COMMUNITY)
Admission: RE | Admit: 2024-04-17 | Discharge: 2024-04-17 | Disposition: A | Discharge status: Home / Self Care | Source: Ambulatory Visit | Attending: Internal Medicine

## 2024-04-17 DIAGNOSIS — Z48812 Encounter for surgical aftercare following surgery on the circulatory system: Secondary | ICD-10-CM | POA: Diagnosis not present

## 2024-04-17 DIAGNOSIS — I252 Old myocardial infarction: Secondary | ICD-10-CM | POA: Diagnosis not present

## 2024-04-17 DIAGNOSIS — I213 ST elevation (STEMI) myocardial infarction of unspecified site: Secondary | ICD-10-CM

## 2024-04-17 DIAGNOSIS — Z955 Presence of coronary angioplasty implant and graft: Secondary | ICD-10-CM | POA: Diagnosis not present

## 2024-04-19 ENCOUNTER — Encounter (HOSPITAL_COMMUNITY)
Admission: RE | Admit: 2024-04-19 | Discharge: 2024-04-19 | Disposition: A | Discharge status: Home / Self Care | Source: Ambulatory Visit | Attending: Internal Medicine | Admitting: Internal Medicine

## 2024-04-19 DIAGNOSIS — I213 ST elevation (STEMI) myocardial infarction of unspecified site: Secondary | ICD-10-CM

## 2024-04-19 DIAGNOSIS — Z955 Presence of coronary angioplasty implant and graft: Secondary | ICD-10-CM | POA: Diagnosis not present

## 2024-04-19 DIAGNOSIS — Z48812 Encounter for surgical aftercare following surgery on the circulatory system: Secondary | ICD-10-CM | POA: Diagnosis not present

## 2024-04-19 DIAGNOSIS — I252 Old myocardial infarction: Secondary | ICD-10-CM | POA: Diagnosis not present

## 2024-04-21 ENCOUNTER — Encounter (HOSPITAL_COMMUNITY)
Admission: RE | Admit: 2024-04-21 | Discharge: 2024-04-21 | Disposition: A | Discharge status: Home / Self Care | Source: Ambulatory Visit | Attending: Internal Medicine | Admitting: Internal Medicine

## 2024-04-21 DIAGNOSIS — Z48812 Encounter for surgical aftercare following surgery on the circulatory system: Secondary | ICD-10-CM | POA: Diagnosis not present

## 2024-04-21 DIAGNOSIS — I213 ST elevation (STEMI) myocardial infarction of unspecified site: Secondary | ICD-10-CM

## 2024-04-21 DIAGNOSIS — Z955 Presence of coronary angioplasty implant and graft: Secondary | ICD-10-CM | POA: Diagnosis not present

## 2024-04-21 DIAGNOSIS — I252 Old myocardial infarction: Secondary | ICD-10-CM | POA: Diagnosis not present

## 2024-04-26 ENCOUNTER — Encounter (HOSPITAL_COMMUNITY)
Admission: RE | Admit: 2024-04-26 | Discharge: 2024-04-26 | Disposition: A | Discharge status: Home / Self Care | Source: Ambulatory Visit | Attending: Internal Medicine | Admitting: Internal Medicine

## 2024-04-26 DIAGNOSIS — Z955 Presence of coronary angioplasty implant and graft: Secondary | ICD-10-CM | POA: Diagnosis not present

## 2024-04-26 DIAGNOSIS — Z48812 Encounter for surgical aftercare following surgery on the circulatory system: Secondary | ICD-10-CM | POA: Insufficient documentation

## 2024-04-26 DIAGNOSIS — I213 ST elevation (STEMI) myocardial infarction of unspecified site: Secondary | ICD-10-CM

## 2024-04-26 DIAGNOSIS — I252 Old myocardial infarction: Secondary | ICD-10-CM | POA: Diagnosis not present

## 2024-04-28 ENCOUNTER — Encounter (HOSPITAL_COMMUNITY)
Admission: RE | Admit: 2024-04-28 | Discharge: 2024-04-28 | Disposition: A | Discharge status: Home / Self Care | Source: Ambulatory Visit | Attending: Internal Medicine | Admitting: Internal Medicine

## 2024-04-28 ENCOUNTER — Other Ambulatory Visit (HOSPITAL_COMMUNITY): Payer: Self-pay

## 2024-04-28 DIAGNOSIS — Z48812 Encounter for surgical aftercare following surgery on the circulatory system: Secondary | ICD-10-CM | POA: Diagnosis not present

## 2024-04-28 DIAGNOSIS — I252 Old myocardial infarction: Secondary | ICD-10-CM | POA: Diagnosis not present

## 2024-04-28 DIAGNOSIS — I213 ST elevation (STEMI) myocardial infarction of unspecified site: Secondary | ICD-10-CM | POA: Diagnosis not present

## 2024-04-28 DIAGNOSIS — Z955 Presence of coronary angioplasty implant and graft: Secondary | ICD-10-CM | POA: Diagnosis not present

## 2024-05-01 ENCOUNTER — Encounter (HOSPITAL_COMMUNITY)
Admission: RE | Admit: 2024-05-01 | Discharge: 2024-05-01 | Disposition: A | Discharge status: Home / Self Care | Source: Ambulatory Visit | Attending: Internal Medicine | Admitting: Internal Medicine

## 2024-05-01 ENCOUNTER — Other Ambulatory Visit (HOSPITAL_COMMUNITY): Payer: Self-pay

## 2024-05-01 DIAGNOSIS — Z955 Presence of coronary angioplasty implant and graft: Secondary | ICD-10-CM

## 2024-05-01 DIAGNOSIS — I252 Old myocardial infarction: Secondary | ICD-10-CM | POA: Diagnosis not present

## 2024-05-01 DIAGNOSIS — I213 ST elevation (STEMI) myocardial infarction of unspecified site: Secondary | ICD-10-CM | POA: Diagnosis not present

## 2024-05-01 DIAGNOSIS — Z48812 Encounter for surgical aftercare following surgery on the circulatory system: Secondary | ICD-10-CM | POA: Diagnosis not present

## 2024-05-02 NOTE — Progress Notes (Signed)
 Cardiac Individual Treatment Plan  Patient Details  Name: Peter [name] MRN: [id number] Date of Birth: 08/01/1966 Referring Provider:   Flowsheet Row INTENSIVE CARDIAC REHAB ORIENT from 02/24/2024 in [facility]  Referring Provider Dr. Lurena Red, MD    Initial Encounter Date:  Flowsheet Row INTENSIVE CARDIAC REHAB ORIENT from 02/24/2024 in [facility]  Date 02/24/24    Visit Diagnosis: ST elevation myocardial infarction (STEMI), unspecified artery [facility])  Status post coronary artery stent placement  Patient's Home Medications on Admission:  Current Outpatient Medications:    aspirin  81 MG chewable tablet, Chew 1 tablet (81 mg total) by mouth daily., Disp: 90 tablet, Rfl: 2   atorvastatin  (LIPITOR ) 80 MG tablet, Take 1 tablet (80 mg total) by mouth daily., Disp: 90 tablet, Rfl: 1   EPINEPHrine (PRIMATENE MIST) 0.125 MG/ACT AERO, Inhale 1 puff into the lungs as needed (SOB/Asthma)., Disp: , Rfl:    famotidine (PEPCID) 20 MG tablet, Take 20 mg by mouth daily., Disp: , Rfl:    metoprolol  succinate (TOPROL -XL) 25 MG 24 hr tablet, Take 1 tablet (25 mg total) by mouth daily., Disp: 90 tablet, Rfl: 1   nitroGLYCERIN  (NITROSTAT ) 0.4 MG SL tablet, Place 1 tablet (0.4 mg total) under the tongue every 5 (five) minutes as needed for chest pain., Disp: 25 tablet, Rfl: 2   prasugrel  (EFFIENT ) 10 MG TABS tablet, Take 1 tablet (10 mg total) by mouth daily., Disp: 90 tablet, Rfl: 2   sodium fluoride  (FLUORISHIELD) 1.1 % GEL dental gel, Place 1 Application onto teeth at bedtime., Disp: , Rfl:   Past Medical History: Past Medical History:  Diagnosis Date   COVID 04/04/2021   states mild sx   Degenerative disc disease, lumbar    Dr. Duwayne   Dyslipidemia    GERD (gastroesophageal reflux disease)    Helicobacter pylori antibody positive 02/21/2005   treated with antibiotics     Tobacco Use: Social History   Tobacco Use  Smoking Status Never  Smokeless Tobacco Never    Labs: Review Flowsheet  More data may exist      Latest Ref Rng & Units 07/23/2011 02/27/2016 05/25/2019 03/25/2023 01/29/2024  Labs for ITP Cardiac and Pulmonary Rehab  Cholestrol 0 - 200 mg/dL - 873  887  860  887   LDL (calc) 0 - 99 mg/dL - 58  48  69  39   HDL-C >40 mg/dL - 41  33  61.69  30   Trlycerides <150 mg/dL - 865  812  843.9  786   Hemoglobin A1c 4.8 - 5.6 % - 5.6  - 6.0  6.0   PH, Arterial 7.35 - 7.45 - - - - 7.322  7.393   PCO2 arterial 32 - 48 mmHg - - - - 52.6  29.5   Bicarbonate 20.0 - 28.0 mmol/L - - - - 28.3  27.9  27.3  18.0   TCO2 22 - 32 mmol/L 23  - - - 30  30  29  19    Acid-base deficit 0.0 - 2.0 mmol/L - - - - 6.0   O2 Saturation % - - - - 73  76  95  96     Details       Multiple values from one day are sorted in reverse-chronological order          Exercise Target Goals: Exercise Program Goal: Individual exercise prescription  set using results from initial 6 min walk test and THRR while considering  patient's activity barriers and safety.   Exercise Prescription Goal: Initial exercise prescription builds to 30-45 minutes a day of aerobic activity, 2-3 days per week.  Home exercise guidelines will be given to patient during program as part of exercise prescription that the participant will acknowledge.   Education: Aerobic Exercise: - Group verbal and visual presentation on the components of exercise prescription. Introduces F.I.T.T principle from ACSM for exercise prescriptions.  Reviews F.I.T.T. principles of aerobic exercise including progression. Written material provided at class time.   Education: Resistance Exercise: - Group verbal and visual presentation on the components of exercise prescription. Introduces F.I.T.T principle from ACSM for exercise prescriptions  Reviews F.I.T.T. principles of resistance exercise including progression. Written  material provided at class time.    Education: Exercise & Equipment Safety: - Individual verbal instruction and demonstration of equipment use and safety with use of the equipment.   Education: Exercise Physiology & General Exercise Guidelines: - Group verbal and written instruction with models to review the exercise physiology of the cardiovascular system and associated critical values. Provides general exercise guidelines with specific guidelines to those with heart or lung disease. Written material provided at class time.   Education: Flexibility, Balance, Mind/Body Relaxation: - Group verbal and visual presentation with interactive activity on the components of exercise prescription. Introduces F.I.T.T principle from ACSM for exercise prescriptions. Reviews F.I.T.T. principles of flexibility and balance exercise training including progression. Also discusses the mind body connection.  Reviews various relaxation techniques to help reduce and manage stress (i.e. Deep breathing, progressive muscle relaxation, and visualization). Balance handout provided to take home. Written material provided at class time.   Activity Barriers & Risk Stratification:  Activity Barriers & Cardiac Risk Stratification - 02/24/24 1522       Activity Barriers & Cardiac Risk Stratification   Activity Barriers None    Cardiac Risk Stratification High   Under 5 MET's         6 Minute Walk:  6 Minute Walk     Row Name 02/24/24 1513         6 Minute Walk   Phase Initial     Distance 1900 feet     Walk Time 6 minutes     # of Rest Breaks 0     MPH 3.6     METS 3.6     RPE 11     Perceived Dyspnea  0     VO2 Peak 16.62     Symptoms No     Resting HR 72 bpm     Resting BP 106/64     Resting Oxygen Saturation  96 %     Exercise Oxygen Saturation  during 6 min walk 97 %     Max Ex. HR 98 bpm     Max Ex. BP 118/60     2 Minute Post BP 114/68        Oxygen Initial Assessment:   Oxygen  Re-Evaluation:   Oxygen Discharge (Final Oxygen Re-Evaluation):   Initial Exercise Prescription:  Initial Exercise Prescription - 02/24/24 1500       Date of Initial Exercise RX and Referring Provider   Date 02/24/24    Referring Provider Dr. Lurena Red, MD    Expected Discharge Date 05/17/24      Treadmill   MPH 3.8    Grade 0    Minutes 15    METs 3.91  Recumbant Elliptical   Level 2    RPM 45    Watts 90    Minutes 15    METs 3.5      Prescription Details   Frequency (times per week) 3    Duration Progress to 30 minutes of continuous aerobic without signs/symptoms of physical distress      Intensity   THRR 40-80% of Max Heartrate 65-131    Ratings of Perceived Exertion 11-13    Perceived Dyspnea 0-4      Progression   Progression Continue progressive overload as per policy without signs/symptoms or physical distress.      Resistance Training   Training Prescription Yes    Weight 3    Reps 10-15          Perform Capillary Blood Glucose checks as needed.  Exercise Prescription Changes:   Exercise Prescription Changes     Row Name 02/28/24 1400 03/15/24 1500 03/24/24 1400 03/29/24 1600 04/14/24 1500     Response to Exercise   Blood Pressure (Admit) 104/70 114/70 98/62 110/60 106/54   Blood Pressure (Exercise) 108/64 138/78 -- -- --   Blood Pressure (Exit) 102/64 98/54 96/62  108/66 103/67   Heart Rate (Admit) 90 bpm 77 bpm 66 bpm 76 bpm 78 bpm   Heart Rate (Exercise) 128 bpm 128 bpm 124 bpm 114 bpm 133 bpm   Heart Rate (Exit) 98 bpm 86 bpm 75 bpm 85 bpm 89 bpm   Rating of Perceived Exertion (Exercise) 12 10 9 9 10    Symptoms None None None None None   Comments Pt first day Reviewd METs Reviewed Home exercise Rx Reviewed METs and goals Reviewed METs   Duration Continue with 30 min of aerobic exercise without signs/symptoms of physical distress. Continue with 30 min of aerobic exercise without signs/symptoms of physical distress. Continue with 30  min of aerobic exercise without signs/symptoms of physical distress. Continue with 30 min of aerobic exercise without signs/symptoms of physical distress. Continue with 30 min of aerobic exercise without signs/symptoms of physical distress.   Intensity THRR unchanged THRR unchanged THRR unchanged THRR unchanged THRR unchanged     Progression   Progression Continue to progress workloads to maintain intensity without signs/symptoms of physical distress. Continue to progress workloads to maintain intensity without signs/symptoms of physical distress. Continue to progress workloads to maintain intensity without signs/symptoms of physical distress. Continue to progress workloads to maintain intensity without signs/symptoms of physical distress. Continue to progress workloads to maintain intensity without signs/symptoms of physical distress.   Average METs 4.2 5 3.4 5.57 5.75     Resistance Training   Training Prescription Yes No Yes No Yes   Weight 4 No weights on Wednesdays 4 lbs No weights on wednesdays 5 lbs   Reps 10-15 -- 10-15 -- 10-15   Time -- -- 10 Minutes -- 10 Minutes     Interval Training   Interval Training -- No No No --     Treadmill   MPH 3.7 3.7 3.7 3.7 4   Grade 0 0 1 1 1    Minutes 15 15 15 15 15    METs 3.83 3.83 4.34 4.34 4.61     Recumbant Elliptical   Level 2 2 3 3 4    RPM 56 80 72 90 90   Watts 90 116 73 129 142   Minutes 15 15 15 15 15    METs 4.6 5.7 2.5 6.8 2.9     Home Exercise Plan   Plans to continue  exercise at -- -- Home (comment) Home (comment) Home (comment)   Frequency -- -- Add 2 additional days to program exercise sessions. Add 2 additional days to program exercise sessions. Add 2 additional days to program exercise sessions.   Initial Home Exercises Provided -- -- 03/24/24 03/24/24 03/24/24    Row Name 04/26/24 1500             Response to Exercise   Blood Pressure (Admit) 110/62       Blood Pressure (Exit) 100/60       Heart Rate (Admit) 66  bpm       Heart Rate (Exercise) 118 bpm       Heart Rate (Exit) 79 bpm       Rating of Perceived Exertion (Exercise) 10       Symptoms None       Comments Reviewed METs       Duration Continue with 30 min of aerobic exercise without signs/symptoms of physical distress.       Intensity THRR unchanged         Progression   Progression Continue to progress workloads to maintain intensity without signs/symptoms of physical distress.       Average METs 6.25         Resistance Training   Training Prescription No       Weight No wt on Wednesdays         Interval Training   Interval Training No         Treadmill   MPH 4.2       Grade 1       Minutes 15       METs 4.8         Recumbant Elliptical   Level 4       RPM 191       Watts 150       Minutes 15       METs 7.7         Home Exercise Plan   Plans to continue exercise at Home (comment)       Frequency Add 2 additional days to program exercise sessions.       Initial Home Exercises Provided 03/24/24          Exercise Comments:   Exercise Comments     Row Name 02/28/24 1406 03/15/24 1500 03/24/24 1417 03/29/24 1627 04/14/24 1506   Exercise Comments Pt first day in program. Pt tolerated 30 minutes of aerobic exercise and resistance exercise w/o unusual signs and symptoms. Pt averaged 4.2 METs for the day. Reviewed METs. Pt is making good progress. No workload changes today. Reviewed home exericse Rx with patient today. Pt will begin walking at home 2x/week for 30 minutes to bring to the 150 minute exercise goal. Pt verbalized understanding of the home exercise Rx and was provided a copy. Reviewed METs and goals. Making good progress. Peak METs to date are 7.0. Reviewed METs with patient, continues to make good progress.    Row Name 04/26/24 1059           Exercise Comments Reviewed METs with patient, continues to make good progress.          Exercise Goals and Review:   Exercise Goals     Row Name 02/24/24 1529              Exercise Goals   Increase Physical Activity Yes       Intervention Provide advice, education, support  and counseling about physical activity/exercise needs.;Develop an individualized exercise prescription for aerobic and resistive training based on initial evaluation findings, risk stratification, comorbidities and participant's personal goals.       Expected Outcomes Long Term: Exercising regularly at least 3-5 days a week.;Short Term: Attend rehab on a regular basis to increase amount of physical activity.;Long Term: Add in home exercise to make exercise part of routine and to increase amount of physical activity.       Increase Strength and Stamina Yes       Intervention Provide advice, education, support and counseling about physical activity/exercise needs.;Develop an individualized exercise prescription for aerobic and resistive training based on initial evaluation findings, risk stratification, comorbidities and participant's personal goals.       Expected Outcomes Short Term: Increase workloads from initial exercise prescription for resistance, speed, and METs.;Short Term: Perform resistance training exercises routinely during rehab and add in resistance training at home;Long Term: Improve cardiorespiratory fitness, muscular endurance and strength as measured by increased METs and functional capacity ( )       Able to understand and use rate of perceived exertion (RPE) scale Yes       Intervention Provide education and explanation on how to use RPE scale       Expected Outcomes Short Term: Able to use RPE daily in rehab to express subjective intensity level;Long Term:  Able to use RPE to guide intensity level when exercising independently       Knowledge and understanding of Target Heart Rate Range (THRR) Yes       Intervention Provide education and explanation of THRR including how the numbers were predicted and where they are located for reference       Expected Outcomes Short  Term: Able to state/look up THRR;Long Term: Able to use THRR to govern intensity when exercising independently;Short Term: Able to use daily as guideline for intensity in rehab       Understanding of Exercise Prescription Yes       Intervention Provide education, explanation, and written materials on patient's individual exercise prescription       Expected Outcomes Short Term: Able to explain program exercise prescription;Long Term: Able to explain home exercise prescription to exercise independently          Exercise Goals Re-Evaluation :  Exercise Goals Re-Evaluation     Row Name 02/28/24 1632 03/29/24 1629 04/28/24 1533         Exercise Goal Re-Evaluation   Exercise Goals Review Increase Physical Activity;Increase Strength and Stamina;Able to understand and use rate of perceived exertion (RPE) scale;Knowledge and understanding of Target Heart Rate Range (THRR);Understanding of Exercise Prescription -- Increase Physical Activity;Increase Strength and Stamina;Able to understand and use rate of perceived exertion (RPE) scale;Knowledge and understanding of Target Heart Rate Range (THRR);Understanding of Exercise Prescription     Comments Pt's first day in the CRP2 program. Pt understands the exercise Rx, THRR, and RPE scale. Reviewed METs and goals with patient today. Pt voices progress on all of his goals: getting back into an exercise routine, increased strength and stamina. Reviewed METs and goals with patient today. Pt voices progress on all of his goals: He is exercising 5x/week , 3 day in the CRP2 program and 2 days walking at home for 3 miles. Establishing this routine was a goal. Pt also voices he continues to progress on his goal of improved strength and stamina.     Expected Outcomes Will continue to monitor patient and progress exercise workloads  as tolerated. Will continue to monitor patient and progress exercise workloads as tolerated. Will continue to monitor patient and progress  exercise workloads as tolerated.        Discharge Exercise Prescription (Final Exercise Prescription Changes):  Exercise Prescription Changes - 04/26/24 1500       Response to Exercise   Blood Pressure (Admit) 110/62    Blood Pressure (Exit) 100/60    Heart Rate (Admit) 66 bpm    Heart Rate (Exercise) 118 bpm    Heart Rate (Exit) 79 bpm    Rating of Perceived Exertion (Exercise) 10    Symptoms None    Comments Reviewed METs    Duration Continue with 30 min of aerobic exercise without signs/symptoms of physical distress.    Intensity THRR unchanged      Progression   Progression Continue to progress workloads to maintain intensity without signs/symptoms of physical distress.    Average METs 6.25      Resistance Training   Training Prescription No    Weight No wt on Wednesdays      Interval Training   Interval Training No      Treadmill   MPH 4.2    Grade 1    Minutes 15    METs 4.8      Recumbant Elliptical   Level 4    RPM 191    Watts 150    Minutes 15    METs 7.7      Home Exercise Plan   Plans to continue exercise at Home (comment)    Frequency Add 2 additional days to program exercise sessions.    Initial Home Exercises Provided 03/24/24          Nutrition:  Target Goals: Understanding of nutrition guidelines, daily intake of sodium 1500mg , cholesterol 200mg , calories 30% from fat and 7% or less from saturated fats, daily to have 5 or more servings of fruits and vegetables.  Education: Nutrition 1 -Group instruction provided by verbal, written material, interactive activities, discussions, models, and posters to present general guidelines for heart healthy nutrition including macronutrients, label reading, and promoting whole foods over processed counterparts. Education serves as Pensions consultant of discussion of heart healthy eating for all. Written material provided at class time.    Education: Nutrition 2 -Group instruction provided by verbal, written  material, interactive activities, discussions, models, and posters to present general guidelines for heart healthy nutrition including sodium, cholesterol, and saturated fat. Providing guidance of habit forming to improve blood pressure, cholesterol, and body weight. Written material provided at class time.     Biometrics:   Post Biometrics - 02/24/24 1512        Post  Biometrics   Waist Circumference 34.75 inches    Hip Circumference 36.5 inches    Waist to Hip Ratio 0.95 %    Triceps Skinfold 11 mm    % Body Fat 16.5 %    Grip Strength 33 kg    Flexibility --   Unable to reach   Single Leg Stand 30 seconds          Nutrition Therapy Plan and Nutrition Goals:  Nutrition Therapy & Goals - 03/30/24 1204       Nutrition Therapy   Diet Heart healthy diet    Drug/Food Interactions Statins/Certain Fruits      Personal Nutrition Goals   Nutrition Goal Patient to identify strategies for reducing cardiovascular risk by attending the [facility] education and nutrition series weekly.   goal in action.  Personal Goal #2 Patient to improve diet quality by using the plate method as a guide for meal planning to include lean protein/plant protein, fruits, vegetables, whole grains, nonfat dairy as part of a well-balanced diet.   goal in action.   Comments Goals in action. Patient has medical history of STEMI s/p PCI/DES, hyperlipidemia with goal LDL <55. LDL is at goal. A1c and triglycerides are elevated in a pre-diabetic range. He continues to attend the [facility] education and nutrition series regularly. He has maintianed his weight since starting with our program. His wife is a good support. Patient will benefit from participation in intensive cardiac rehab for nutrition education, exercise, and lifestyle modification      Intervention Plan   Intervention Prescribe, educate and counsel regarding individualized specific dietary modifications aiming towards targeted core components such as  weight, hypertension, lipid management, diabetes, heart failure and other comorbidities.;Nutrition handout(s) given to patient.    Expected Outcomes Short Term Goal: Understand basic principles of dietary content, such as calories, fat, sodium, cholesterol and nutrients.;Long Term Goal: Adherence to prescribed nutrition plan.          Nutrition Assessments:  Nutrition Assessments - 02/28/24 1437       Rate Your Plate Scores   Pre Score 79         MEDIFICTS Score Key: >=70 Need to make dietary changes  40-70 Heart Healthy Diet <= 40 Therapeutic Level Cholesterol Diet  Flowsheet Row INTENSIVE CARDIAC REHAB from 02/28/2024 in [facility], [facility], & Lung Health  Picture Your Plate Total Score on Admission 79   Picture Your Plate Scores: <59 Unhealthy dietary pattern with much room for improvement. 41-50 Dietary pattern unlikely to meet recommendations for good health and room for improvement. 51-60 More healthful dietary pattern, with some room for improvement.  >60 Healthy dietary pattern, although there may be some specific behaviors that could be improved.    Nutrition Goals Re-Evaluation:  Nutrition Goals Re-Evaluation     Row Name 02/28/24 1334 03/30/24 1204           Goals   Current Weight 159 lb 13.3 oz (72.5 kg) 159 lb 13.3 oz (72.5 kg)      Comment Lpa WNL, triglycerides 213, HDL 30, LDL 39 no new labs; most recent labs Lpa WNL, triglycerides 213, HDL 30, LDL 39      Expected Outcome Patient has medical history of STEMI s/p PCI/DES, hyperlipidemia with goal LDL <55. LDL is at goal. A1c and triglycerides are elevated in a pre-diabetic range. Patient will benefit from participation in intensive cardiac rehab for nutrition education, exercise, and lifestyle modification. Goals in action. Patient has medical history of STEMI s/p PCI/DES, hyperlipidemia with goal LDL <55. LDL is at goal. A1c and triglycerides are elevated in a pre-diabetic  range. He continues to attend the [facility] education and nutrition series regularly. He has maintianed his weight since starting with our program. His wife is a good support. Patient will benefit from participation in intensive cardiac rehab for nutrition education, exercise, and lifestyle modification         Nutrition Goals Discharge (Final Nutrition Goals Re-Evaluation):  Nutrition Goals Re-Evaluation - 03/30/24 1204       Goals   Current Weight 159 lb 13.3 oz (72.5 kg)    Comment no new labs; most recent labs Lpa WNL, triglycerides 213, HDL 30, LDL 39    Expected Outcome Goals in action. Patient has medical history of STEMI s/p PCI/DES,  hyperlipidemia with goal LDL <55. LDL is at goal. A1c and triglycerides are elevated in a pre-diabetic range. He continues to attend the [facility] education and nutrition series regularly. He has maintianed his weight since starting with our program. His wife is a good support. Patient will benefit from participation in intensive cardiac rehab for nutrition education, exercise, and lifestyle modification          Psychosocial: Target Goals: Acknowledge presence or absence of significant depression and/or stress, maximize coping skills, provide positive support system. Participant is able to verbalize types and ability to use techniques and skills needed for reducing stress and depression.   Education: Stress, Anxiety, and Depression - Group verbal and visual presentation to define topics covered.  Reviews how body is impacted by stress, anxiety, and depression.  Also discusses healthy ways to reduce stress and to treat/manage anxiety and depression. Written material provided at class time.   Education: Sleep Hygiene -Provides group verbal and written instruction about how sleep can affect your health.  Define sleep hygiene, discuss sleep cycles and impact of sleep habits. Review good sleep hygiene tips.   Initial Review & Psychosocial Screening:   Initial Psych Review & Screening - 02/24/24 1534       Initial Review   Current issues with None Identified      Family Dynamics   Good Support System? Yes   Has good support from family, no needs at this time     Barriers   Psychosocial barriers to participate in program There are no identifiable barriers or psychosocial needs.      Screening Interventions   Interventions Encouraged to exercise;To provide support and resources with identified psychosocial needs;Provide feedback about the scores to participant    Expected Outcomes Long Term Goal: Stressors or current issues are controlled or eliminated.;Short Term goal: Identification and review with participant of any Quality of Life or Depression concerns found by scoring the questionnaire.;Long Term goal: The participant improves quality of Life and PHQ9 Scores as seen by post scores and/or verbalization of changes          Quality of Life Scores:   Quality of Life - 02/24/24 1535       Quality of Life   Select Quality of Life      Quality of Life Scores   Health/Function Pre 29.2 %    Socioeconomic Pre 30 %    Psych/Spiritual Pre 30 %    Family Pre 30 %    GLOBAL Pre 29.65 %         Scores of 19 and below usually indicate a poorer quality of life in these areas.  A difference of  2-3 points is a clinically meaningful difference.  A difference of 2-3 points in the total score of the Quality of Life Index has been associated with significant improvement in overall quality of life, self-image, physical symptoms, and general health in studies assessing change in quality of life.  PHQ-9: Review Flowsheet  More data exists      02/24/2024 02/16/2024 02/03/2024 01/13/2024 04/15/2023  Depression screen PHQ 2/9  Decreased Interest 0 0 0 0 0  Down, Depressed, Hopeless 0 0 0 0 0  PHQ - 2 Score 0 0 0 0 0  Altered sleeping 0 0 - - -  Tired, decreased energy 0 0 - - -  Change in appetite 0 0 - - -  Feeling bad or failure about  yourself  0 0 - - -  Trouble concentrating  0 0 - - -  Moving slowly or fidgety/restless 0 0 - - -  Suicidal thoughts 0 0 - - -  PHQ-9 Score 0 0 - - -  Difficult doing work/chores Not difficult at all Not difficult at all - - -   Interpretation of Total Score  Total Score Depression Severity:  1-4 = Minimal depression, 5-9 = Mild depression, 10-14 = Moderate depression, 15-19 = Moderately severe depression, 20-27 = Severe depression   Psychosocial Evaluation and Intervention:   Psychosocial Re-Evaluation:  Psychosocial Re-Evaluation     Row Name 02/29/24 0812 05/01/24 1646           Psychosocial Re-Evaluation   Current issues with None Identified None Identified      Interventions Encouraged to attend Cardiac Rehabilitation for the exercise Encouraged to attend Cardiac Rehabilitation for the exercise      Continue Psychosocial Services  No Follow up required No Follow up required         Psychosocial Discharge (Final Psychosocial Re-Evaluation):  Psychosocial Re-Evaluation - 05/01/24 1646       Psychosocial Re-Evaluation   Current issues with None Identified    Interventions Encouraged to attend Cardiac Rehabilitation for the exercise    Continue Psychosocial Services  No Follow up required          Vocational Rehabilitation: Provide vocational rehab assistance to qualifying candidates.   Vocational Rehab Evaluation & Intervention:  Vocational Rehab - 02/24/24 1545       Initial Vocational Rehab Evaluation & Intervention   Assessment shows need for Vocational Rehabilitation No   No needs         Education: Education Goals: Education classes will be provided on a variety of topics geared toward better understanding of heart health and risk factor modification. Participant will state understanding/return demonstration of topics presented as noted by education test scores.  Learning Barriers/Preferences:  Learning Barriers/Preferences - 02/24/24 1536        Learning Barriers/Preferences   Learning Preferences Computer/Internet;Group Instruction;Individual Instruction;Pictoral;Video;Written Material;Verbal Instruction;Skilled Demonstration;Audio          General Cardiac Education Topics:  AED/CPR: - Group verbal and written instruction with the use of models to demonstrate the basic use of the AED with the basic ABC's of resuscitation.   Test and Procedures: - Group verbal and visual presentation and models provide information about basic cardiac anatomy and function. Reviews the testing methods done to diagnose heart disease and the outcomes of the test results. Describes the treatment choices: Medical Management, Angioplasty, or Coronary Bypass Surgery for treating various heart conditions including Myocardial Infarction, Angina, Valve Disease, and Cardiac Arrhythmias. Written material provided at class time.   Medication Safety: - Group verbal and visual instruction to review commonly prescribed medications for heart and lung disease. Reviews the medication, class of the drug, and side effects. Includes the steps to properly store meds and maintain the prescription regimen. Written material provided at class time.   Intimacy: - Group verbal instruction through game format to discuss how heart and lung disease can affect sexual intimacy. Written material provided at class time.   Know Your Numbers and Heart Failure: - Group verbal and visual instruction to discuss disease risk factors for cardiac and pulmonary disease and treatment options.  Reviews associated critical values for Overweight/Obesity, Hypertension, Cholesterol, and Diabetes.  Discusses basics of heart failure: signs/symptoms and treatments.  Introduces Heart Failure Zone chart for action plan for heart failure. Written material provided at class time.   Infection  Prevention: - Provides verbal and written material to individual with discussion of infection control including  proper hand washing and proper equipment cleaning during exercise session.   Falls Prevention: - Provides verbal and written material to individual with discussion of falls prevention and safety.   Other: -Provides group and verbal instruction on various topics (see comments)   Knowledge Questionnaire Score:  Knowledge Questionnaire Score - 02/24/24 1537       Knowledge Questionnaire Score   Pre Score 22/24          Core Components/Risk Factors/Patient Goals at Admission:  Personal Goals and Risk Factors at Admission - 02/24/24 1545       Core Components/Risk Factors/Patient Goals on Admission   Lipids Yes    Intervention Provide education and support for participant on nutrition & aerobic/resistive exercise along with prescribed medications to achieve LDL 70mg , HDL >40mg .    Expected Outcomes Short Term: Participant states understanding of desired cholesterol values and is compliant with medications prescribed. Participant is following exercise prescription and nutrition guidelines.;Long Term: Cholesterol controlled with medications as prescribed, with individualized exercise RX and with personalized nutrition plan. Value goals: LDL < 70mg , HDL > 40 mg.    Personal Goal Other Yes    Personal Goal Short: get into Ex routine, Long term: increase strength and stamina    Intervention Will continue to monitor pt and progress workloads as tolerated without sign or symptom    Expected Outcomes Pt will achieve his goals          Education:Diabetes - Individual verbal and written instruction to review signs/symptoms of diabetes, desired ranges of glucose level fasting, after meals and with exercise. Acknowledge that pre and post exercise glucose checks will be done for 3 sessions at entry of program.   Core Components/Risk Factors/Patient Goals Review:   Goals and Risk Factor Review     Row Name 02/29/24 0816 03/07/24 1225 05/01/24 1646         Core Components/Risk  Factors/Patient Goals Review   Personal Goals Review Weight Management/Obesity;Lipids Weight Management/Obesity;Lipids Weight Management/Obesity;Lipids     Review [name] started cardiac rehab on 02/28/24. [name] did well with exercise. vital signs were stable. [name] started cardiac rehab on 02/28/24. [name] is off to a great start with exercise. vital signs  have been  stable. [name] started cardiac rehab on 02/28/24. [name] continues to exercise and is increasing his met levels. Vital signs have been stable.     Expected Outcomes [name] will continue to participate in cardiac rehab for exercise, nutriton and lifestyle modifications [name] will continue to participate in cardiac rehab for exercise, nutriton and lifestyle modifications [name] will continue to participate in cardiac rehab for exercise, nutriton and lifestyle modifications        Core Components/Risk Factors/Patient Goals at Discharge (Final Review):   Goals and Risk Factor Review - 05/01/24 1646       Core Components/Risk Factors/Patient Goals Review   Personal Goals Review Weight Management/Obesity;Lipids    Review [name] started cardiac rehab on 02/28/24. [name] continues to exercise and is increasing his met levels. Vital signs have been stable.    Expected Outcomes [name] will continue to participate in cardiac rehab for exercise, nutriton and lifestyle modifications          ITP Comments:  ITP Comments     Row Name 02/29/24 0810 03/07/24 1222 05/01/24 1646       ITP Comments 30 Day ITP Review. [name] started cardiac rehab on 02/28/24. [name] did  well with exercise. 30 Day ITP Review. [name] started cardiac rehab on 02/28/24. [name] is off to a good start with exercise. [name] is doing well with exercise at cardiac rehab. Vital signs have been stable. [name] has increased his met levels.        Comments: see ITP comments

## 2024-05-03 ENCOUNTER — Encounter (HOSPITAL_COMMUNITY)
Admission: RE | Admit: 2024-05-03 | Discharge: 2024-05-03 | Disposition: A | Discharge status: Home / Self Care | Source: Ambulatory Visit | Attending: Internal Medicine

## 2024-05-03 DIAGNOSIS — Z955 Presence of coronary angioplasty implant and graft: Secondary | ICD-10-CM

## 2024-05-03 DIAGNOSIS — I213 ST elevation (STEMI) myocardial infarction of unspecified site: Secondary | ICD-10-CM | POA: Diagnosis not present

## 2024-05-03 DIAGNOSIS — I252 Old myocardial infarction: Secondary | ICD-10-CM | POA: Diagnosis not present

## 2024-05-03 DIAGNOSIS — Z48812 Encounter for surgical aftercare following surgery on the circulatory system: Secondary | ICD-10-CM | POA: Diagnosis not present

## 2024-05-05 ENCOUNTER — Encounter (HOSPITAL_COMMUNITY)
Admission: RE | Admit: 2024-05-05 | Discharge: 2024-05-05 | Disposition: A | Discharge status: Home / Self Care | Source: Ambulatory Visit | Attending: Internal Medicine

## 2024-05-05 DIAGNOSIS — Z48812 Encounter for surgical aftercare following surgery on the circulatory system: Secondary | ICD-10-CM | POA: Diagnosis not present

## 2024-05-05 DIAGNOSIS — I213 ST elevation (STEMI) myocardial infarction of unspecified site: Secondary | ICD-10-CM

## 2024-05-05 DIAGNOSIS — Z955 Presence of coronary angioplasty implant and graft: Secondary | ICD-10-CM

## 2024-05-08 ENCOUNTER — Encounter (HOSPITAL_COMMUNITY)
Admission: RE | Admit: 2024-05-08 | Discharge: 2024-05-08 | Disposition: A | Discharge status: Home / Self Care | Source: Ambulatory Visit | Attending: Internal Medicine | Admitting: Internal Medicine

## 2024-05-08 DIAGNOSIS — I213 ST elevation (STEMI) myocardial infarction of unspecified site: Secondary | ICD-10-CM

## 2024-05-08 DIAGNOSIS — Z48812 Encounter for surgical aftercare following surgery on the circulatory system: Secondary | ICD-10-CM | POA: Diagnosis not present

## 2024-05-08 DIAGNOSIS — I252 Old myocardial infarction: Secondary | ICD-10-CM | POA: Diagnosis not present

## 2024-05-08 DIAGNOSIS — Z955 Presence of coronary angioplasty implant and graft: Secondary | ICD-10-CM | POA: Diagnosis not present

## 2024-05-10 ENCOUNTER — Encounter (HOSPITAL_COMMUNITY)
Admission: RE | Admit: 2024-05-10 | Discharge: 2024-05-10 | Disposition: A | Discharge status: Home / Self Care | Source: Ambulatory Visit | Attending: Internal Medicine

## 2024-05-10 DIAGNOSIS — Z955 Presence of coronary angioplasty implant and graft: Secondary | ICD-10-CM

## 2024-05-10 DIAGNOSIS — Z48812 Encounter for surgical aftercare following surgery on the circulatory system: Secondary | ICD-10-CM | POA: Diagnosis not present

## 2024-05-10 DIAGNOSIS — I213 ST elevation (STEMI) myocardial infarction of unspecified site: Secondary | ICD-10-CM

## 2024-05-11 NOTE — Progress Notes (Signed)
 Cardiology Office Note:   Date:  05/16/2024  ID:  Peter [name], DOB December 20, 1965, MRN [id number] PCP:  Geofm Glade PARAS, MD  [facility] Providers Cardiologist:  Wendel Haws, MD Referring MD: Geofm Glade PARAS, MD  Chief Complaint/Reason for Referral: Follow-up ACS ASSESSMENT:    1. 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 Cardiac arrest with ventricular fibrillation (HCC)   3. Ischemic cardiomyopathy   4. Hyperlipidemia LDL goal <55   5. Aortic atherosclerosis   6. Primary hypertension     PLAN:   In order of problems listed above: STEMI: Continue Effient  10 mg daily, aspirin  81 mg daily, atorvastatin  80 mg daily, Toprol  25 mg daily.  Continue DAPT until June 2026 then stop aspirin  Cardiac arrest: Due to acute coronary syndrome.  Continue metoprolol  25 mg daily. Ischemic cardiomyopathy: Continue Toprol  25 mg.  Obtain echocardiogram.  If ejection fraction still abnormal will optimize GDMT. Hyperlipidemia: Continue atorvastatin  80 mg.  Check lipid panel, LFTs today Aortic atherosclerosis: Continue aspirin  81 mg, atorvastatin  80. Hypertension: Continue Toprol  25 mg; changed to nighttime dosing            Dispo:  Return in about 6 months (around 11/13/2024).       I spent 33 minutes reviewing all clinical data during and prior to this visit including all relevant imaging studies, laboratories, clinical information from other health systems and prior notes from both Cardiology and other specialties, interviewing the patient, conducting a complete physical examination, and coordinating care in order to formulate a comprehensive and personalized evaluation and treatment plan.   History of Present Illness:    FOCUSED PROBLEM LIST:   ACS June 2025 Inferior STEMI >> PCI DES x 1 left circumflex Complicated by VF arrest ICM Posterior lateral hypokinesis, no significant valve issues, EF 50 to 55% TTE June 2025 Hyperlipidemia LP(a) 12.3 Aortic  atherosclerosis PET  CT 2025 Hypertension BMI 17 May 2024:   The patient returns for routine follow-up.  He was last seen in June following his admission for an acute coronary syndrome.  At that visit he was doing well.  His blood pressure was well-controlled.  No changes were made to his medical regimen.  Patient is doing well.  He has lost quite a bit of weight.  He denies any chest pain or shortness of breath.  He has occasional twinges of chest pain especially when he moves in a certain direction or does a certain movement.  These are not reproducible.  They do not occur on a regular basis.  He has needed no as needed nitroglycerin .  He has been tolerating his statin well.  He has had some mild nuisance bruising but no severe bleeding.  He denies any signs or symptoms of stroke.  He denies any severe presyncope or syncope.  He completed cardiac rehabilitation without any issues.  He is otherwise well without complaints.     Current Medications: Current Meds  Medication Sig   aspirin  81 MG chewable tablet Chew 1 tablet (81 mg total) by mouth daily.   atorvastatin  (LIPITOR ) 80 MG tablet Take 1 tablet (80 mg total) by mouth daily.   EPINEPHrine (PRIMATENE MIST) 0.125 MG/ACT AERO Inhale 1 puff into the lungs as needed (SOB/Asthma).   famotidine (PEPCID) 20 MG tablet Take 20 mg by mouth daily.   metoprolol  succinate (TOPROL -XL) 25 MG 24 hr tablet Take 1 tablet (25 mg total) by mouth daily.   nitroGLYCERIN  (NITROSTAT ) 0.4 MG SL tablet Place 1  tablet (0.4 mg total) under the tongue every 5 (five) minutes as needed for chest pain.   prasugrel  (EFFIENT ) 10 MG TABS tablet Take 1 tablet (10 mg total) by mouth daily.   sodium fluoride  (FLUORISHIELD) 1.1 % GEL dental gel Place 1 Application onto teeth at bedtime.     Review of Systems:   Please see the history of present illness.    All other systems reviewed and are negative.     EKGs/Labs/Other Test Reviewed:   EKG: 2025 normal sinus  rhythm  EKG Interpretation Date/Time:    Ventricular Rate:    PR Interval:    QRS Duration:    QT Interval:    QTC Calculation:   R Axis:      Text Interpretation:          CARDIAC STUDIES: Refer to CV Procedures and Imaging Tabs   Risk Assessment/Calculations:          Physical Exam:   VS:  BP 104/70   Pulse (!) 56   Ht 5' 8 (1.727 m)   Wt 157 lb 6.4 oz (71.4 kg)   SpO2 98%   BMI 23.93 kg/m        Wt Readings from Last 3 Encounters:  05/16/24 157 lb 6.4 oz (71.4 kg)  05/10/24 160 lb 4.4 oz (72.7 kg)  04/04/24 160 lb 9.6 oz (72.8 kg)      GENERAL:  No apparent distress, AOx3 HEENT:  No carotid bruits, +2 carotid impulses, no scleral icterus CAR: RRR no murmurs, gallops, rubs, or thrills RES:  Clear to auscultation bilaterally ABD:  Soft, nontender, nondistended, positive bowel sounds x 4 VASC:  +2 radial pulses, +2 carotid pulses NEURO:  CN 2-12 grossly intact; motor and sensory grossly intact PSYCH:  No active depression or anxiety EXT:  No edema, ecchymosis, or cyanosis  Signed, Mardene Lessig K Eola Waldrep, MD  05/16/2024 9:01 AM    [facility] [address] [place], [place], [place]  [address] Phone: [phone]; Fax: [phone]   Note:  This document was prepared using Dragon voice recognition software and may include unintentional dictation errors.

## 2024-05-12 ENCOUNTER — Encounter (HOSPITAL_COMMUNITY)
Admission: RE | Admit: 2024-05-12 | Discharge: 2024-05-12 | Disposition: A | Discharge status: Home / Self Care | Source: Ambulatory Visit | Attending: Internal Medicine | Admitting: Internal Medicine

## 2024-05-12 VITALS — Ht 68.0 in | Wt 160.3 lb

## 2024-05-12 DIAGNOSIS — I213 ST elevation (STEMI) myocardial infarction of unspecified site: Secondary | ICD-10-CM | POA: Diagnosis not present

## 2024-05-12 DIAGNOSIS — I252 Old myocardial infarction: Secondary | ICD-10-CM | POA: Diagnosis not present

## 2024-05-12 DIAGNOSIS — Z48812 Encounter for surgical aftercare following surgery on the circulatory system: Secondary | ICD-10-CM | POA: Diagnosis not present

## 2024-05-12 DIAGNOSIS — Z955 Presence of coronary angioplasty implant and graft: Secondary | ICD-10-CM

## 2024-05-15 ENCOUNTER — Encounter (HOSPITAL_COMMUNITY)
Admission: RE | Admit: 2024-05-15 | Discharge: 2024-05-15 | Disposition: A | Discharge status: Home / Self Care | Source: Ambulatory Visit | Attending: Internal Medicine | Admitting: Internal Medicine

## 2024-05-15 DIAGNOSIS — Z48812 Encounter for surgical aftercare following surgery on the circulatory system: Secondary | ICD-10-CM | POA: Diagnosis not present

## 2024-05-15 DIAGNOSIS — I213 ST elevation (STEMI) myocardial infarction of unspecified site: Secondary | ICD-10-CM

## 2024-05-15 DIAGNOSIS — Z955 Presence of coronary angioplasty implant and graft: Secondary | ICD-10-CM

## 2024-05-16 ENCOUNTER — Encounter: Payer: Self-pay | Admitting: Internal Medicine

## 2024-05-16 ENCOUNTER — Ambulatory Visit: Attending: Internal Medicine | Admitting: Internal Medicine

## 2024-05-16 VITALS — BP 104/70 | HR 56 | Ht 68.0 in | Wt 157.4 lb

## 2024-05-16 DIAGNOSIS — I700 Atherosclerosis of aorta: Secondary | ICD-10-CM

## 2024-05-16 DIAGNOSIS — I255 Ischemic cardiomyopathy: Secondary | ICD-10-CM

## 2024-05-16 DIAGNOSIS — E785 Hyperlipidemia, unspecified: Secondary | ICD-10-CM | POA: Diagnosis not present

## 2024-05-16 DIAGNOSIS — I2121 ST elevation (STEMI) myocardial infarction involving left circumflex coronary artery: Secondary | ICD-10-CM | POA: Diagnosis not present

## 2024-05-16 DIAGNOSIS — I4901 Ventricular fibrillation: Secondary | ICD-10-CM

## 2024-05-16 DIAGNOSIS — I469 Cardiac arrest, cause unspecified: Secondary | ICD-10-CM | POA: Diagnosis not present

## 2024-05-16 DIAGNOSIS — I10 Essential (primary) hypertension: Secondary | ICD-10-CM

## 2024-05-16 NOTE — Patient Instructions (Signed)
 Medication Instructions:  CHANGE Metoprolol  Succinate (Toprol -XL) to bedtime  *If you need a refill on your cardiac medications before your next appointment, please call your pharmacy*  Lab Work: To be completed today: lipid panel and LFT  If you have labs (blood work) drawn today and your tests are completely normal, you will receive your results only by: MyChart Message (if you have MyChart) OR A paper copy in the mail If you have any lab test that is abnormal or we need to change your treatment, we will call you to review the results.  Testing/Procedures: Your physician has requested that you have an echocardiogram. Echocardiography is a painless test that uses sound waves to create images of your heart. It provides your doctor with information about the size and shape of your heart and how well your heart's chambers and valves are working. This procedure takes approximately one hour. There are no restrictions for this procedure. Please do NOT wear cologne, perfume, aftershave, or lotions (deodorant is allowed). Please arrive 15 minutes prior to your appointment time.  Please note: We ask at that you not bring children with you during ultrasound (echo/ vascular) testing. Due to room size and safety concerns, children are not allowed in the ultrasound rooms during exams. Our front office staff cannot provide observation of children in our lobby area while testing is being conducted. An adult accompanying a patient to their appointment will only be allowed in the ultrasound room at the discretion of the ultrasound technician under special circumstances. We apologize for any inconvenience.   Follow-Up: At [facility], you and your health needs are our priority.  As part of our continuing mission to provide you with exceptional heart care, our providers are all part of one team.  This team includes your primary Cardiologist (physician) and Advanced Practice Providers or APPs (Physician  Assistants and Nurse Practitioners) who all work together to provide you with the care you need, when you need it.  Your next appointment:   6 month(s)  Provider:   Arun Thukkani, MD

## 2024-05-17 ENCOUNTER — Encounter (HOSPITAL_COMMUNITY)
Admission: RE | Admit: 2024-05-17 | Discharge: 2024-05-17 | Disposition: A | Discharge status: Home / Self Care | Source: Ambulatory Visit | Attending: Internal Medicine | Admitting: Internal Medicine

## 2024-05-17 ENCOUNTER — Ambulatory Visit: Payer: Self-pay | Admitting: Internal Medicine

## 2024-05-17 DIAGNOSIS — I213 ST elevation (STEMI) myocardial infarction of unspecified site: Secondary | ICD-10-CM | POA: Diagnosis not present

## 2024-05-17 DIAGNOSIS — I252 Old myocardial infarction: Secondary | ICD-10-CM | POA: Diagnosis not present

## 2024-05-17 DIAGNOSIS — Z48812 Encounter for surgical aftercare following surgery on the circulatory system: Secondary | ICD-10-CM | POA: Diagnosis not present

## 2024-05-17 DIAGNOSIS — Z955 Presence of coronary angioplasty implant and graft: Secondary | ICD-10-CM | POA: Diagnosis not present

## 2024-05-17 LAB — HEPATIC FUNCTION PANEL
ALT: 27 IU/L (ref 0–44)
AST: 25 IU/L (ref 0–40)
Albumin: 4 g/dL (ref 3.8–4.9)
Alkaline Phosphatase: 98 IU/L (ref 47–123)
Bilirubin Total: 0.4 mg/dL (ref 0.0–1.2)
Bilirubin, Direct: 0.18 mg/dL (ref 0.00–0.40)
Total Protein: 6.6 g/dL (ref 6.0–8.5)

## 2024-05-17 LAB — LIPID PANEL
Chol/HDL Ratio: 1.8 ratio (ref 0.0–5.0)
Cholesterol, Total: 71 mg/dL — ABNORMAL LOW (ref 100–199)
HDL: 39 mg/dL — ABNORMAL LOW (ref >39)
LDL Chol Calc (NIH): 16 mg/dL (ref 0–99)
Triglycerides: 75 mg/dL (ref 0–149)
VLDL Cholesterol Cal: 16 mg/dL (ref 5–40)

## 2024-05-17 NOTE — Progress Notes (Signed)
 Discharge Progress Report  Patient Details  Name: Peter [name] MRN: [id number] Date of Birth: 10/30/1965 Referring Provider:   Flowsheet Row INTENSIVE CARDIAC REHAB ORIENT from 02/24/2024 in [facility]  Referring Provider Dr. Lurena Red, MD     Number of Visits: 32  Reason for Discharge:  Patient reached a stable level of exercise. Patient independent in their exercise. Patient has met program and personal goals.  Smoking History:  Social History   Tobacco Use  Smoking Status Never  Smokeless Tobacco Never    Diagnosis:  ST elevation myocardial infarction (STEMI), unspecified artery (HCC)  Status post coronary artery stent placement  ADL [facility]:   Initial Exercise Prescription:  Initial Exercise Prescription - 02/24/24 1500       Date of Initial Exercise RX and Referring Provider   Date 02/24/24    Referring Provider Dr. Lurena Red, MD    Expected Discharge Date 05/17/24      Treadmill   MPH 3.8    Grade 0    Minutes 15    METs 3.91      Recumbant Elliptical   Level 2    RPM 45    Watts 90    Minutes 15    METs 3.5      Prescription Details   Frequency (times per week) 3    Duration Progress to 30 minutes of continuous aerobic without signs/symptoms of physical distress      Intensity   THRR 40-80% of Max Heartrate 65-131    Ratings of Perceived Exertion 11-13    Perceived Dyspnea 0-4      Progression   Progression Continue progressive overload as per policy without signs/symptoms or physical distress.      Resistance Training   Training Prescription Yes    Weight 3    Reps 10-15          Discharge Exercise Prescription (Final Exercise Prescription Changes):  Exercise Prescription Changes - 04/26/24 1500       Response to Exercise   Blood Pressure (Admit) 110/62    Blood Pressure (Exit) 100/60    Heart Rate (Admit) 66 bpm    Heart Rate (Exercise) 118 bpm    Heart Rate  (Exit) 79 bpm    Rating of Perceived Exertion (Exercise) 10    Symptoms None    Comments Reviewed METs    Duration Continue with 30 min of aerobic exercise without signs/symptoms of physical distress.    Intensity THRR unchanged      Progression   Progression Continue to progress workloads to maintain intensity without signs/symptoms of physical distress.    Average METs 6.25      Resistance Training   Training Prescription No    Weight No wt on Wednesdays      Interval Training   Interval Training No      Treadmill   MPH 4.2    Grade 1    Minutes 15    METs 4.8      Recumbant Elliptical   Level 4    RPM 191    Watts 150    Minutes 15    METs 7.7      Home Exercise Plan   Plans to continue exercise at Home (comment)    Frequency Add 2 additional days to program exercise sessions.    Initial Home Exercises Provided 03/24/24          Functional Capacity:  6  Minute Walk     Row Name 02/24/24 1513 05/10/24 1254       6 Minute Walk   Phase Initial Discharge    Distance 1900 feet 2260 feet    Distance % Change -- 18.95 %    Distance Feet Change -- 360 ft    Walk Time 6 minutes 6 minutes    # of Rest Breaks 0 0    MPH 3.6 4.3    METS 3.6 5.1    RPE 11 7    Perceived Dyspnea  0 0    VO2 Peak 16.62 17.9    Symptoms No No    Resting HR 72 bpm 66 bpm    Resting BP 106/64 108/60    Resting Oxygen Saturation  96 % --    Exercise Oxygen Saturation  during 6 min walk 97 % --    Max Ex. HR 98 bpm 62 bpm    Max Ex. BP 118/60 130/62    2 Minute Post BP 114/68 --       Psychological, QOL, Others - Outcomes: PHQ 2/9:    05/17/2024    2:00 PM 02/24/2024    3:50 PM 02/16/2024   11:23 AM 02/03/2024   10:49 AM 01/13/2024    1:13 PM  Depression screen PHQ 2/9  Decreased Interest 0 0 0 0 0  Down, Depressed, Hopeless 0 0 0 0 0  PHQ - 2 Score 0 0 0 0 0  Altered sleeping 0 0 0    Tired, decreased energy 0 0 0    Change in appetite 0 0 0    Feeling bad or failure  about yourself  0 0 0    Trouble concentrating 0 0 0    Moving slowly or fidgety/restless 0 0 0    Suicidal thoughts 0 0 0    PHQ-9 Score 0 0 0    Difficult doing work/chores Not difficult at all Not difficult at all Not difficult at all      Quality of Life:  Quality of Life - 02/24/24 1535       Quality of Life   Select Quality of Life      Quality of Life Scores   Health/Function Pre 29.2 %    Socioeconomic Pre 30 %    Psych/Spiritual Pre 30 %    Family Pre 30 %    GLOBAL Pre 29.65 %          Personal Goals: Goals established at orientation with interventions provided to work toward goal.  Personal Goals and Risk Factors at Admission - 02/24/24 1545       Core Components/Risk Factors/Patient Goals on Admission   Lipids Yes    Intervention Provide education and support for participant on nutrition & aerobic/resistive exercise along with prescribed medications to achieve LDL 70mg , HDL >40mg .    Expected Outcomes Short Term: Participant states understanding of desired cholesterol values and is compliant with medications prescribed. Participant is following exercise prescription and nutrition guidelines.;Long Term: Cholesterol controlled with medications as prescribed, with individualized exercise RX and with personalized nutrition plan. Value goals: LDL < 70mg , HDL > 40 mg.    Personal Goal Other Yes    Personal Goal Short: get into Ex routine, Long term: increase strength and stamina    Intervention Will continue to monitor pt and progress workloads as tolerated without sign or symptom    Expected Outcomes Pt will achieve his goals  Personal Goals Discharge:  Goals and Risk Factor Review     Row Name 02/29/24 0816 03/07/24 1225 05/01/24 1646         Core Components/Risk Factors/Patient Goals Review   Personal Goals Review Weight Management/Obesity;Lipids Weight Management/Obesity;Lipids Weight Management/Obesity;Lipids     Review Peter [name] started cardiac  rehab on 02/28/24. Peter [name] did well with exercise. vital signs were stable. Peter [name] started cardiac rehab on 02/28/24. Peter [name] is off to a great start with exercise. vital signs  have been  stable. Peter [name] started cardiac rehab on 02/28/24. Peter [name] continues to exercise and is increasing his met levels. Vital signs have been stable.     Expected Outcomes Peter [name] will continue to participate in cardiac rehab for exercise, nutriton and lifestyle modifications Peter [name] will continue to participate in cardiac rehab for exercise, nutriton and lifestyle modifications Peter [name] will continue to participate in cardiac rehab for exercise, nutriton and lifestyle modifications        Exercise Goals and Review:  Exercise Goals     Row Name 02/24/24 1529             Exercise Goals   Increase Physical Activity Yes       Intervention Provide advice, education, support and counseling about physical activity/exercise needs.;Develop an individualized exercise prescription for aerobic and resistive training based on initial evaluation findings, risk stratification, comorbidities and participant's personal goals.       Expected Outcomes Long Term: Exercising regularly at least 3-5 days a week.;Short Term: Attend rehab on a regular basis to increase amount of physical activity.;Long Term: Add in home exercise to make exercise part of routine and to increase amount of physical activity.       Increase Strength and Stamina Yes       Intervention Provide advice, education, support and counseling about physical activity/exercise needs.;Develop an individualized exercise prescription for aerobic and resistive training based on initial evaluation findings, risk stratification, comorbidities and participant's personal goals.       Expected Outcomes Short Term: Increase workloads from initial exercise prescription for resistance, speed, and METs.;Short Term: Perform resistance training exercises routinely during rehab and add in resistance training at  home;Long Term: Improve cardiorespiratory fitness, muscular endurance and strength as measured by increased METs and functional capacity ( )       Able to understand and use rate of perceived exertion (RPE) scale Yes       Intervention Provide education and explanation on how to use RPE scale       Expected Outcomes Short Term: Able to use RPE daily in rehab to express subjective intensity level;Long Term:  Able to use RPE to guide intensity level when exercising independently       Knowledge and understanding of Target Heart Rate Range (THRR) Yes       Intervention Provide education and explanation of THRR including how the numbers were predicted and where they are located for reference       Expected Outcomes Short Term: Able to state/look up THRR;Long Term: Able to use THRR to govern intensity when exercising independently;Short Term: Able to use daily as guideline for intensity in rehab       Understanding of Exercise Prescription Yes       Intervention Provide education, explanation, and written materials on patient's individual exercise prescription       Expected Outcomes Short Term: Able to explain program exercise prescription;Long Term: Able to explain home exercise prescription to exercise independently  Exercise Goals Re-Evaluation:  Exercise Goals Re-Evaluation     Row Name 02/28/24 1632 03/29/24 1629 04/28/24 1533         Exercise Goal Re-Evaluation   Exercise Goals Review Increase Physical Activity;Increase Strength and Stamina;Able to understand and use rate of perceived exertion (RPE) scale;Knowledge and understanding of Target Heart Rate Range (THRR);Understanding of Exercise Prescription -- Increase Physical Activity;Increase Strength and Stamina;Able to understand and use rate of perceived exertion (RPE) scale;Knowledge and understanding of Target Heart Rate Range (THRR);Understanding of Exercise Prescription     Comments Pt's first day in the [facility] program. Pt  understands the exercise Rx, THRR, and RPE scale. Reviewed METs and goals with patient today. Pt voices progress on all of his goals: getting back into an exercise routine, increased strength and stamina. Reviewed METs and goals with patient today. Pt voices progress on all of his goals: He is exercising 5x/week , 3 day in the [facility] program and 2 days walking at home for 3 miles. Establishing this routine was a goal. Pt also voices he continues to progress on his goal of improved strength and stamina.     Expected Outcomes Will continue to monitor patient and progress exercise workloads as tolerated. Will continue to monitor patient and progress exercise workloads as tolerated. Will continue to monitor patient and progress exercise workloads as tolerated.        Nutrition & Weight - Outcomes:   Post Biometrics - 05/10/24 1305        Post  Biometrics   Height 5' 8 (1.727 m)    Weight 72.7 kg    Waist Circumference 34.5 inches    Hip Circumference 38 inches    Waist to Hip Ratio 0.91 %    BMI (Calculated) 24.38    Triceps Skinfold 12 mm    % Body Fat 22.5 %   entry %BF is incorrect, should be 22.2   Grip Strength 38 kg    Flexibility --   unable to reach   Single Leg Stand 30 seconds          Nutrition:  Nutrition Therapy & Goals - 03/30/24 1204       Nutrition Therapy   Diet Heart healthy diet    Drug/Food Interactions Statins/Certain Fruits      Personal Nutrition Goals   Nutrition Goal Patient to identify strategies for reducing cardiovascular risk by attending the [employer] education and nutrition series weekly.   goal in action.   Personal Goal #2 Patient to improve diet quality by using the plate method as a guide for meal planning to include lean protein/plant protein, fruits, vegetables, whole grains, nonfat dairy as part of a well-balanced diet.   goal in action.   Comments Goals in action. Patient has medical history of STEMI s/p PCI/DES, hyperlipidemia with goal LDL <55.  LDL is at goal. A1c and triglycerides are elevated in a pre-diabetic range. He continues to attend the [facility] education and nutrition series regularly. He has maintianed his weight since starting with our program. His wife is a good support. Patient will benefit from participation in intensive cardiac rehab for nutrition education, exercise, and lifestyle modification      Intervention Plan   Intervention Prescribe, educate and counsel regarding individualized specific dietary modifications aiming towards targeted core components such as weight, hypertension, lipid management, diabetes, heart failure and other comorbidities.;Nutrition handout(s) given to patient.    Expected Outcomes Short Term Goal: Understand basic principles of dietary content, such as calories, fat,  sodium, cholesterol and nutrients.;Long Term Goal: Adherence to prescribed nutrition plan.          Nutrition Discharge:  Nutrition Assessments - 02/28/24 1437       Rate Your Plate Scores   Pre Score 79          Education Questionnaire Score:  Knowledge Questionnaire Score - 02/24/24 1537       Knowledge Questionnaire Score   Pre Score 22/24          Goals reviewed with patient; copy given to patient. Pt graduates from  Intensive/Traditional cardiac rehab program on 05/17/24  with completion of   32 exercise and  25 education sessions. Pt maintained good attendance and progressed nicely during his participation in rehab as evidenced by increased MET level. Peter [name] increased his distance on his post exercise walk test by 360 feet.  Medication list reconciled. Repeat  PHQ score- 0 .  Pt has made significant lifestyle changes and should be commended for their success. Peter [name] achieved their goals during cardiac rehab.   Pt plans to continue exercise at [employer] 5-6 days a week.  We are proud of Jeff's progress! Hadassah Elpidio Quan RN BSN

## 2024-05-25 NOTE — Telephone Encounter (Signed)
 Error

## 2024-06-16 ENCOUNTER — Ambulatory Visit (HOSPITAL_COMMUNITY)
Admission: RE | Admit: 2024-06-16 | Discharge: 2024-06-16 | Disposition: A | Discharge status: Home / Self Care | Source: Ambulatory Visit | Attending: Radiation Oncology | Admitting: Radiation Oncology

## 2024-06-16 DIAGNOSIS — C779 Secondary and unspecified malignant neoplasm of lymph node, unspecified: Secondary | ICD-10-CM | POA: Insufficient documentation

## 2024-06-16 DIAGNOSIS — C770 Secondary and unspecified malignant neoplasm of lymph nodes of head, face and neck: Secondary | ICD-10-CM | POA: Diagnosis not present

## 2024-06-16 DIAGNOSIS — C801 Malignant (primary) neoplasm, unspecified: Secondary | ICD-10-CM | POA: Insufficient documentation

## 2024-06-16 MED ORDER — IOHEXOL 300 MG/ML  SOLN
100.0000 mL | Freq: Once | INTRAMUSCULAR | Status: AC | PRN
  Administered 2024-06-16: 100 mL via INTRAVENOUS

## 2024-06-16 MED ORDER — SODIUM CHLORIDE (PF) 0.9 % IJ SOLN
INTRAMUSCULAR | Status: AC
  Filled 2024-06-16: qty 50

## 2024-06-19 NOTE — Progress Notes (Signed)
 Patient Peter [name] is here for a follow-up appointment today for: Secondary and unspecified malignant neoplasm of lymph nodes of head, face and neck.   Treatment Completion Date: 06/30/2023 Pain issues, if any: Denies Using a feeding tube?: N/A Weight changes, if any: Stable Wt Readings from Last 3 Encounters: 06/20/24          162.6 lb  04/04/24 160 lb 9.6 oz (72.8 kg)  02/24/24 159 lb 13.3 oz (72.5 kg)      Swallowing issues, if any: Yes Smoking or chewing tobacco? Denies Using fluoride  toothpaste daily? Yes Last ENT visit was on: 01/25/24 with Dr. Vaughan Ricker Other notable issues, if any:     BP (P) 108/76 (BP Location: Left Arm, Patient Position: Sitting)   Pulse (P) 68   Temp (!) (P) 97.1 F (36.2 C) (Temporal)   Resp (P) 18   Ht (P) 5' 8 (1.727 m)   Wt (P) 162 lb 6 oz (73.7 kg)   SpO2 (P) 100%   BMI (P) 24.69 kg/m

## 2024-06-20 ENCOUNTER — Encounter: Payer: Self-pay | Admitting: Radiology

## 2024-06-20 ENCOUNTER — Ambulatory Visit
Admission: RE | Admit: 2024-06-20 | Discharge: 2024-06-20 | Disposition: A | Discharge status: Home / Self Care | Source: Ambulatory Visit | Attending: Radiology | Admitting: Radiology

## 2024-06-20 DIAGNOSIS — C801 Malignant (primary) neoplasm, unspecified: Secondary | ICD-10-CM | POA: Insufficient documentation

## 2024-06-20 DIAGNOSIS — K117 Disturbances of salivary secretion: Secondary | ICD-10-CM | POA: Insufficient documentation

## 2024-06-20 DIAGNOSIS — Z79899 Other long term (current) drug therapy: Secondary | ICD-10-CM | POA: Diagnosis not present

## 2024-06-20 DIAGNOSIS — Z923 Personal history of irradiation: Secondary | ICD-10-CM | POA: Diagnosis not present

## 2024-06-20 DIAGNOSIS — C779 Secondary and unspecified malignant neoplasm of lymph node, unspecified: Secondary | ICD-10-CM

## 2024-06-20 DIAGNOSIS — I2510 Atherosclerotic heart disease of native coronary artery without angina pectoris: Secondary | ICD-10-CM | POA: Insufficient documentation

## 2024-06-20 DIAGNOSIS — I890 Lymphedema, not elsewhere classified: Secondary | ICD-10-CM | POA: Diagnosis not present

## 2024-06-20 DIAGNOSIS — Z7982 Long term (current) use of aspirin: Secondary | ICD-10-CM | POA: Diagnosis not present

## 2024-06-20 DIAGNOSIS — Z7902 Long term (current) use of antithrombotics/antiplatelets: Secondary | ICD-10-CM | POA: Diagnosis not present

## 2024-06-20 DIAGNOSIS — C770 Secondary and unspecified malignant neoplasm of lymph nodes of head, face and neck: Secondary | ICD-10-CM | POA: Insufficient documentation

## 2024-06-20 NOTE — Progress Notes (Signed)
 Radiation Oncology         [phone] ________________________________  Name: Peter [name] MRN: [id number]  Date: 06/20/2024  DOB: 10-Sep-1965  Follow-Up Visit Note  CC: Geofm Glade PARAS, MD  Geofm Glade PARAS, MD  Diagnosis and Prior Radiotherapy:       ICD-10-CM   1. 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 [facility])  C77.9    C80.1         Cancer Staging  Head and neck cancer (HCC) Staging form: Pharynx - HPV-Mediated Oropharynx, AJCC 8th Edition - Clinical stage from 04/07/2023: Stage I (cT0, cN1, cM0, p16+) - Unsigned Stage prefix: Initial diagnosis  First Treatment Date: 2023-05-13 - Last Treatment Date: 2023-06-30   Plan Name: HN_Orop Site: Oropharynx Technique: IMRT Mode: Photon Dose Per Fraction: 2 Gy Prescribed Dose (Delivered / Prescribed): 70 Gy / 70 Gy Prescribed Fxs (Delivered / Prescribed): 35 / 35   Stage I (cT0, N1, M0) squamous cell carcinoma, p16+; s/p definitive radiation completed on 06/30/2023    CHIEF COMPLAINT:  Here for follow-up and surveillance of neck cancer; s/p radiation definitive radiation completed on 06/30/2023.   Narrative:  The patient returns today for routine follow-up and to review recent imaging.  He was last seen in our office 2.5 months ago.  He saw his dentist recently who noted some swelling on the side of the neck. Out of an abundance of caution we moved up his scheduled imaging for further evaluation.   CT of the neck and chest on 06/16/2024 demonstrates no CT evidence of primary malignancy or metastatic disease in the neck or chest.  He was last seen by Dr. Carlie on 01/25/2024.  At that time he reported a persistent knot on his tongue.  Per Dr. Carlie, there was no evidence of mass or ulceration on the tongue or tongue base.  He recommended proceeding with continued surveillance.  Pain issues, if any: Denies Using a  feeding tube?: N/A Weight changes, if any: Stable    Wt Readings from Last 3 Encounters: 06/20/24          162.6 lb  04/04/24 160 lb 9.6 oz (72.8 kg)  02/24/24 159 lb 13.3 oz (72.5 kg)        Swallowing issues, if any: Functional - dry mouth makes it difficult for him to swallow  Smoking or chewing tobacco? Denies Using fluoride  toothpaste daily? Yes Last ENT visit was on: 01/25/24 with Dr. Vaughan Carlie          ALLERGIES:  has no known allergies.  Meds: Current Outpatient Medications  Medication Sig Dispense Refill   aspirin  81 MG chewable tablet Chew 1 tablet (81 mg total) by mouth daily. 90 tablet 2   atorvastatin  (LIPITOR ) 80 MG tablet Take 1 tablet (80 mg total) by mouth daily. 90 tablet 1   EPINEPHrine (PRIMATENE MIST) 0.125 MG/ACT AERO Inhale 1 puff into the lungs as needed (SOB/Asthma).     famotidine (PEPCID) 20 MG tablet Take 20 mg by mouth daily.     metoprolol  succinate (TOPROL -XL) 25 MG 24 hr tablet Take 1 tablet (25 mg total) by mouth daily. 90 tablet 1   nitroGLYCERIN  (NITROSTAT ) 0.4 MG SL tablet Place 1 tablet (0.4 mg total) under the tongue every 5 (five) minutes as needed for chest pain. 25 tablet 2   prasugrel  (EFFIENT ) 10 MG TABS tablet Take 1 tablet (10  mg total) by mouth daily. 90 tablet 2   sodium fluoride  (FLUORISHIELD) 1.1 % GEL dental gel Place 1 Application onto teeth at bedtime.     No current facility-administered medications for this encounter.    Physical Findings:    The patient is in no acute distress. Patient is alert and oriented. Wt Readings from Last 3 Encounters:  06/20/24 (P) 162 lb 6 oz (73.7 kg)  05/16/24 157 lb 6.4 oz (71.4 kg)  05/10/24 160 lb 4.4 oz (72.7 kg)    height is 5' 8 (1.727 m) (pended) and weight is 162 lb 6 oz (73.7 kg) (pended). His temporal temperature is 97.1 F (36.2 C) (abnormal, pended). His blood pressure is 108/76 (pended) and his pulse is 68 (pended). His respiration is 18 (pended) and oxygen saturation is 100%  (pended). .  General: Alert and oriented, in no acute distress HEENT: Head is normocephalic. Extraocular movements are intact. 0.5 cm blood blister with associated papillary lesion on left cheek.  Neck: Neck is notable for no masses. Mild edema at submental region and scar tissue palpated at the right cervical region.  Heart RRR Chest CTAB Abd soft NT ND Skin: Skin in treatment fields shows satisfactory healing over neck Extremities: No cyanosis or edema. Lymphatics: see Neck Exam.   Psychiatric: Judgment and insight are intact. Affect is appropriate.    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 RADIATION DOSE REDUCTION: This exam was performed according to the departmental dose-optimization program which includes automated exposure control, adjustment of the mA and/or kV according to patient size and/or use of iterative reconstruction technique. CONTRAST:  100mL OMNIPAQUE IOHEXOL 300 MG/ML  SOLN COMPARISON:  None Available. FINDINGS: CT NECK FINDINGS Limited Intracranial and Visualized Orbits: Negative. Mastoids and Visualized Paranasal Sinuses: Clear. Pharynx and Larynx: Normal. No mass or edema. Salivary Glands: No inflammation, mass, or calculus. Thyroid : Normal. Lymph Nodes: No lymphadenopathy. Vascular: Negative. Musculoskeletal: No acute osseous findings. CT CHEST FINDINGS Cardiovascular: No significant vascular findings. Normal heart  size. Left coronary artery calcifications. No pericardial effusion. Mediastinum/Nodes: No enlarged mediastinal, hilar, or axillary lymph nodes. Thyroid  gland, trachea, and esophagus demonstrate no significant findings. Lungs/Pleura: Lungs are clear. No pleural effusion or pneumothorax. Upper Abdomen: No acute abnormality. Musculoskeletal: No chest wall abnormality. No acute osseous findings. IMPRESSION: 1. No CT evidence of primary malignancy or metastatic disease in the neck or chest. 2. Specifically, left level II lymphadenopathy remains resolved. 3. Coronary artery disease. Electronically Signed   By: Marolyn JONETTA Jaksch M.D.   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 RADIATION DOSE REDUCTION: This exam was performed according to the departmental dose-optimization program  which includes automated exposure control, adjustment of the mA and/or kV according to patient size and/or use of iterative reconstruction technique. CONTRAST:  100mL OMNIPAQUE IOHEXOL 300 MG/ML  SOLN COMPARISON:  None Available. FINDINGS: CT NECK FINDINGS Limited Intracranial and Visualized Orbits: Negative. Mastoids and Visualized Paranasal Sinuses: Clear. Pharynx and Larynx: Normal. No mass or edema. Salivary Glands: No inflammation, mass, or calculus. Thyroid : Normal. Lymph Nodes: No lymphadenopathy. Vascular: Negative. Musculoskeletal: No acute osseous findings. CT CHEST FINDINGS Cardiovascular: No significant vascular findings. Normal heart size. Left coronary artery calcifications. No pericardial effusion. Mediastinum/Nodes: No enlarged mediastinal, hilar, or axillary lymph nodes. Thyroid  gland,  trachea, and esophagus demonstrate no significant findings. Lungs/Pleura: Lungs are clear. No pleural effusion or pneumothorax. Upper Abdomen: No acute abnormality. Musculoskeletal: No chest wall abnormality. No acute osseous findings. IMPRESSION: 1. No CT evidence of primary malignancy or metastatic disease in the neck or chest. 2. Specifically, left level II lymphadenopathy remains resolved. 3. Coronary artery disease. Electronically Signed   By: Marolyn JONETTA Jaksch M.D.   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 NED per recent CT and chest  Neck swelling is consistent with scar tissue and mild lymphedema, secondary to radiation treatment.    2) Nutritional Status: no issues Wt Readings from Last 3 Encounters:  06/20/24 (P) 162 lb 6 oz (73.7 kg)  05/16/24 157 lb 6.4 oz (71.4 kg)  05/10/24 160 lb 4.4 oz (72.7 kg)   3) Risk Factors:  Patient is a never smoker.   4) Swallowing: Functional  5) Dental: Encouraged to continue regular followup with dentistry, and dental hygiene including fluoride  treatments as tolerated. He states he is following closely with his dentist. We appreciate their care and thorough examinations.   6) Thyroid  function: Checking annually Lab Results  Component Value Date   TSH 3.920 04/04/2024   7) Mild lymphedema: Encouraged patient to continue lymphedema exercises.   8) Xerostomia: encourage use of dehumidifier   7) Patient will follow-up with  Dr. Carlie in April 2026 to stagger appts. CT of neck and chest in one year with rad-onc follow-up. We will obtain TSH at that time as well.   On date of service, in total, we spent 20 minutes on this encounter. Patient was seen in person. Note signed after encounter date; minutes pertain to date of service, only.  -----------------------------------   Leeroy Due, PA-C   Lauraine Golden, MD    [facility]  Radiation Oncology Direct Dial: [phone]  Fax: [phone] .com

## 2024-06-21 ENCOUNTER — Ambulatory Visit (HOSPITAL_COMMUNITY)
Admission: RE | Admit: 2024-06-21 | Discharge: 2024-06-21 | Disposition: A | Discharge status: Home / Self Care | Source: Ambulatory Visit | Attending: Cardiology | Admitting: Cardiology

## 2024-06-21 DIAGNOSIS — I700 Atherosclerosis of aorta: Secondary | ICD-10-CM | POA: Insufficient documentation

## 2024-06-21 DIAGNOSIS — I255 Ischemic cardiomyopathy: Secondary | ICD-10-CM | POA: Diagnosis present

## 2024-06-21 LAB — ECHOCARDIOGRAM COMPLETE
Area-P 1/2: 3.74 cm2
S' Lateral: 3.3 cm

## 2024-06-21 MED ORDER — PERFLUTREN LIPID MICROSPHERE: 1.0000 mL | INTRAVENOUS | Status: AC | PRN

## 2024-07-25 ENCOUNTER — Other Ambulatory Visit (HOSPITAL_COMMUNITY): Payer: Self-pay

## 2024-07-26 ENCOUNTER — Other Ambulatory Visit (HOSPITAL_COMMUNITY): Payer: Self-pay

## 2024-07-28 ENCOUNTER — Telehealth: Payer: Self-pay | Admitting: Cardiology

## 2024-07-28 DIAGNOSIS — I2121 ST elevation (STEMI) myocardial infarction involving left circumflex coronary artery: Secondary | ICD-10-CM

## 2024-07-28 MED ORDER — METOPROLOL SUCCINATE ER 25 MG PO TB24: 25.0000 mg | ORAL_TABLET | Freq: Every day | ORAL | 1 refills | Status: AC

## 2024-07-28 MED ORDER — ATORVASTATIN CALCIUM 80 MG PO TABS: 80.0000 mg | ORAL_TABLET | Freq: Every day | ORAL | 1 refills | Status: AC

## 2024-07-28 NOTE — Telephone Encounter (Signed)
*  STAT* If patient is at the pharmacy, call can be transferred to refill team.   1. Which medications need to be refilled? (please list name of each medication and dose if known)   metoprolol  succinate (TOPROL -XL) 25 MG 24 hr tablet  atorvastatin  (LIPITOR ) 80 MG tablet   2. Would you like to learn more about the convenience, safety, & potential cost savings by using the [facility]?   3. Are you open to using the [facility] (Type [facility]. ).  4. Which pharmacy/location (including street and city if local pharmacy) is medication to be sent to?  [id number] - [place], [place] - [address]   5. Do they need a 30 day or 90 day supply?   90 day  Patient stated he is almost out of these medications.   Patient has appointment scheduled with Dr. Wendel on 3/20.

## 2024-08-08 ENCOUNTER — Ambulatory Visit: Admitting: Radiology

## 2024-09-13 ENCOUNTER — Encounter: Payer: Self-pay | Admitting: Internal Medicine

## 2024-11-10 ENCOUNTER — Ambulatory Visit: Admitting: Internal Medicine

## 2025-06-19 ENCOUNTER — Ambulatory Visit

## 2025-06-19 ENCOUNTER — Ambulatory Visit: Attending: Radiation Oncology | Admitting: Radiology
